# Patient Record
Sex: Female | Born: 1950
Health system: Southern US, Community
[De-identification: ages and names within clinical notes are randomized; demographics above are authoritative.]

## PROBLEM LIST (undated history)

## (undated) DIAGNOSIS — H269 Unspecified cataract: Secondary | ICD-10-CM

## (undated) DIAGNOSIS — M810 Age-related osteoporosis without current pathological fracture: Secondary | ICD-10-CM

## (undated) DIAGNOSIS — G629 Polyneuropathy, unspecified: Secondary | ICD-10-CM

## (undated) DIAGNOSIS — J45909 Unspecified asthma, uncomplicated: Secondary | ICD-10-CM

## (undated) DIAGNOSIS — R011 Cardiac murmur, unspecified: Secondary | ICD-10-CM

## (undated) DIAGNOSIS — E785 Hyperlipidemia, unspecified: Secondary | ICD-10-CM

## (undated) DIAGNOSIS — S329XXA Fracture of unspecified parts of lumbosacral spine and pelvis, initial encounter for closed fracture: Secondary | ICD-10-CM

## (undated) DIAGNOSIS — F329 Major depressive disorder, single episode, unspecified: Secondary | ICD-10-CM

## (undated) DIAGNOSIS — G709 Myoneural disorder, unspecified: Secondary | ICD-10-CM

## (undated) DIAGNOSIS — S060XAA Concussion with loss of consciousness status unknown, initial encounter: Secondary | ICD-10-CM

## (undated) DIAGNOSIS — E739 Lactose intolerance, unspecified: Secondary | ICD-10-CM

## (undated) DIAGNOSIS — F419 Anxiety disorder, unspecified: Secondary | ICD-10-CM

## (undated) DIAGNOSIS — S060X9A Concussion with loss of consciousness of unspecified duration, initial encounter: Secondary | ICD-10-CM

## (undated) DIAGNOSIS — M1990 Unspecified osteoarthritis, unspecified site: Secondary | ICD-10-CM

## (undated) DIAGNOSIS — F32A Depression, unspecified: Secondary | ICD-10-CM

## (undated) DIAGNOSIS — D126 Benign neoplasm of colon, unspecified: Secondary | ICD-10-CM

## (undated) DIAGNOSIS — I201 Angina pectoris with documented spasm: Secondary | ICD-10-CM

## (undated) DIAGNOSIS — Z5189 Encounter for other specified aftercare: Secondary | ICD-10-CM

## (undated) DIAGNOSIS — I7300 Raynaud's syndrome without gangrene: Secondary | ICD-10-CM

## (undated) DIAGNOSIS — N3010 Interstitial cystitis (chronic) without hematuria: Secondary | ICD-10-CM

## (undated) DIAGNOSIS — I341 Nonrheumatic mitral (valve) prolapse: Secondary | ICD-10-CM

## (undated) DIAGNOSIS — D649 Anemia, unspecified: Secondary | ICD-10-CM

## (undated) DIAGNOSIS — E079 Disorder of thyroid, unspecified: Secondary | ICD-10-CM

## (undated) DIAGNOSIS — K219 Gastro-esophageal reflux disease without esophagitis: Secondary | ICD-10-CM

## (undated) DIAGNOSIS — Z1509 Genetic susceptibility to other malignant neoplasm: Secondary | ICD-10-CM

## (undated) DIAGNOSIS — R5383 Other fatigue: Secondary | ICD-10-CM

## (undated) DIAGNOSIS — G4733 Obstructive sleep apnea (adult) (pediatric): Secondary | ICD-10-CM

## (undated) DIAGNOSIS — T7840XA Allergy, unspecified, initial encounter: Secondary | ICD-10-CM

## (undated) DIAGNOSIS — C189 Malignant neoplasm of colon, unspecified: Secondary | ICD-10-CM

## (undated) HISTORY — PX: RETINAL TEAR REPAIR CRYOTHERAPY: SHX5304

## (undated) HISTORY — DX: Polyneuropathy, unspecified: G62.9

## (undated) HISTORY — DX: Allergy, unspecified, initial encounter: T78.40XA

## (undated) HISTORY — DX: Disorder of thyroid, unspecified: E07.9

## (undated) HISTORY — PX: OVARIAN CYST REMOVAL: SHX89

## (undated) HISTORY — PX: FOOT NEUROMA SURGERY: SHX646

## (undated) HISTORY — DX: Other fatigue: R53.83

## (undated) HISTORY — DX: Encounter for other specified aftercare: Z51.89

## (undated) HISTORY — DX: Unspecified osteoarthritis, unspecified site: M19.90

## (undated) HISTORY — PX: VARICOSE VEIN SURGERY: SHX832

## (undated) HISTORY — DX: Interstitial cystitis (chronic) without hematuria: N30.10

## (undated) HISTORY — DX: Unspecified cataract: H26.9

## (undated) HISTORY — DX: Nonrheumatic mitral (valve) prolapse: I34.1

## (undated) HISTORY — DX: Obstructive sleep apnea (adult) (pediatric): G47.33

## (undated) HISTORY — DX: Concussion with loss of consciousness status unknown, initial encounter: S06.0XAA

## (undated) HISTORY — DX: Depression, unspecified: F32.A

## (undated) HISTORY — DX: Major depressive disorder, single episode, unspecified: F32.9

## (undated) HISTORY — DX: Hyperlipidemia, unspecified: E78.5

## (undated) HISTORY — DX: Lactose intolerance, unspecified: E73.9

## (undated) HISTORY — PX: POLYPECTOMY: SHX149

## (undated) HISTORY — DX: Fracture of unspecified parts of lumbosacral spine and pelvis, initial encounter for closed fracture: S32.9XXA

## (undated) HISTORY — PX: MENISCUS REPAIR: SHX5179

## (undated) HISTORY — DX: Anemia, unspecified: D64.9

## (undated) HISTORY — DX: Cardiac murmur, unspecified: R01.1

## (undated) HISTORY — DX: Age-related osteoporosis without current pathological fracture: M81.0

## (undated) HISTORY — PX: COLONOSCOPY: SHX174

## (undated) HISTORY — PX: TOOTH EXTRACTION: SUR596

## (undated) HISTORY — DX: Malignant neoplasm of colon, unspecified: C18.9

## (undated) HISTORY — PX: BUNIONECTOMY: SHX129

## (undated) HISTORY — PX: UPPER GASTROINTESTINAL ENDOSCOPY: SHX188

## (undated) HISTORY — DX: Concussion with loss of consciousness of unspecified duration, initial encounter: S06.0X9A

## (undated) HISTORY — DX: Benign neoplasm of colon, unspecified: D12.6

## (undated) HISTORY — DX: Angina pectoris with documented spasm: I20.1

---

## 1991-03-13 HISTORY — PX: NASAL SINUS SURGERY: SHX719

## 1999-09-14 ENCOUNTER — Other Ambulatory Visit: Admission: RE | Admit: 1999-09-14 | Discharge: 1999-09-14 | Payer: Self-pay | Admitting: *Deleted

## 2000-09-18 ENCOUNTER — Other Ambulatory Visit: Admission: RE | Admit: 2000-09-18 | Discharge: 2000-09-18 | Payer: Self-pay | Admitting: *Deleted

## 2001-05-02 ENCOUNTER — Encounter: Admission: RE | Admit: 2001-05-02 | Discharge: 2001-05-02 | Payer: Self-pay | Admitting: Internal Medicine

## 2001-05-02 ENCOUNTER — Encounter: Payer: Self-pay | Admitting: Internal Medicine

## 2001-12-31 ENCOUNTER — Other Ambulatory Visit: Admission: RE | Admit: 2001-12-31 | Discharge: 2001-12-31 | Payer: Self-pay | Admitting: Obstetrics and Gynecology

## 2003-01-07 ENCOUNTER — Other Ambulatory Visit: Admission: RE | Admit: 2003-01-07 | Discharge: 2003-01-07 | Payer: Self-pay | Admitting: Obstetrics and Gynecology

## 2003-10-04 DIAGNOSIS — D229 Melanocytic nevi, unspecified: Secondary | ICD-10-CM

## 2003-10-04 HISTORY — DX: Melanocytic nevi, unspecified: D22.9

## 2006-08-03 ENCOUNTER — Emergency Department (HOSPITAL_COMMUNITY): Admission: EM | Admit: 2006-08-03 | Discharge: 2006-08-03 | Payer: Self-pay | Admitting: Emergency Medicine

## 2010-03-12 DIAGNOSIS — IMO0001 Reserved for inherently not codable concepts without codable children: Secondary | ICD-10-CM

## 2010-03-12 DIAGNOSIS — Z5189 Encounter for other specified aftercare: Secondary | ICD-10-CM

## 2010-03-12 DIAGNOSIS — C189 Malignant neoplasm of colon, unspecified: Secondary | ICD-10-CM

## 2010-03-12 HISTORY — DX: Encounter for other specified aftercare: Z51.89

## 2010-03-12 HISTORY — DX: Reserved for inherently not codable concepts without codable children: IMO0001

## 2010-03-12 HISTORY — DX: Malignant neoplasm of colon, unspecified: C18.9

## 2010-03-12 HISTORY — PX: OTHER SURGICAL HISTORY: SHX169

## 2010-03-30 ENCOUNTER — Ambulatory Visit (HOSPITAL_COMMUNITY)
Admission: RE | Admit: 2010-03-30 | Discharge: 2010-03-30 | Payer: Self-pay | Source: Home / Self Care | Attending: Gastroenterology | Admitting: Gastroenterology

## 2010-03-31 ENCOUNTER — Ambulatory Visit (HOSPITAL_COMMUNITY)
Admission: RE | Admit: 2010-03-31 | Discharge: 2010-03-31 | Payer: Self-pay | Source: Home / Self Care | Attending: Gastroenterology | Admitting: Gastroenterology

## 2010-03-31 NOTE — Op Note (Signed)
NAMEMAKENZEE, CHOUDHRY              ACCOUNT NO.:  [id number]  MEDICAL RECORD NO.:  [id number]          PATIENT TYPE:  AMB  LOCATION:  ENDO                         FACILITY:  [facility]  PHYSICIAN:  Danise Edge, M.D.   DATE OF BIRTH:  [birth year]  DATE OF PROCEDURE:  03/30/2010 DATE OF DISCHARGE:                              OPERATIVE REPORT   NAME OF REPORT:  Esophagogastroduodenoscopy and colonoscopy report.  REFERRING PHYSICIAN:  Theressa Millard, M.D.  HISTORY:  Angela Hart is a 60 year old female born [birth year].  The patient has unexplained iron-deficiency anemia associated with a low hemoglobin, serum ferritin, and serum iron saturation, associated with burning epigastric discomfort and intermittent crampy periumbilical abdominal discomfort.  She reports no gastrointestinal bleeding, hematuria, hemoptysis, or vaginal bleeding.  In November 2008, the patient underwent a normal screening colonoscopy in a poorly prepped colon.  CHRONIC MEDICATIONS: 1. Lexapro. 2. Wellbutrin. 3. Advil. 4. Tums. 5. Vitamin D. 6. Calcium. 7. Vitamin C. 8. Omega 3 capsules. 9. Glucosamine with chondroitin or Oracea.  PAST MEDICAL AND SURGICAL HISTORY: 1. Right knee arthroscopy for meniscus tear. 2. Sinus surgery. 3. Ovarian cystectomy. 4. Morton's neuroma removal. 5. Varicose vein surgery. 6. Allergic rhinitis. 7. Depression. 8. Mitral valve prolapse. 9. Rosacea. 10.Raynaud's phenomenon.  HABITS:  The patient has never smoked cigarettes.  She does not consume alcohol.  MEDICATION ALLERGIES:  None.  PROCEDURE:  Diagnostic esophagogastroduodenoscopy.  After obtaining informed consent of the patient was placed in the left lateral decubitus position.  She received propofol sedation administered by Anesthesia. The Pentax gastroscope was passed through the posterior hypopharynx into the proximal esophagus without difficulty.  The hypopharynx, larynx and vocal cords appeared  normal.  Esophagoscopy:  The proximal mid and lower segments of the esophageal mucosa appeared normal.  The squamocolumnar junction is noted at 45 cm from the incisor teeth.  Gastroscopy:  Retroflex view of the gastric cardia and fundus was normal.  The gastric body, antrum and pylorus appear normal.  Duodenoscopy:  The duodenal bulb and descending duodenum appear normal. Small bowel biopsies were performed to look for signs of celiac disease.  ASSESSMENT:  Normal esophagogastroduodenoscopy.  Small bowel biopsies to look for celiac disease pending.  PROCEDURE:  Diagnostic colonoscopy.  Anal inspection and digital rectal exam were normal.  The Pentax pediatric colonoscope was introduced into the rectum and advanced to approximately 70 cm from the anal verge.  At 70 cm from the anal verge, there is a large circumferential obstructing tumor consistent with a primary adenocarcinoma.  Anatomically, I cannot determine where the tumor in the colon is.  Colonic preparation was good.  Endoscopic appearance of the rectum was normal.  Retroflexed view of the distal rectum was normal.  Endoscopic appearance of the colon up to 70 cm was normal.  At 70 cm from the anal verge a large circumferential tumor was encountered obstructing the colon.  I was unable to traverse the obstructing colon tumor to perform a complete colonoscopy.  Biopsies were performed to confirm my suspicion of a primary adenocarcinoma.  ASSESSMENT:  Obstructing cancer of the colon noted at 70 cm from the  anal verge with biopsies pending.  I could not determine where in the colon the tumor is located.  Otherwise the colonic mucosa and rectal mucosa distal to the obstructing tumor was normal.  RECOMMENDATIONS:  I will schedule the patient for a CT scan of the abdomen and pelvis to localize the tumor in preparation for surgery.          ______________________________ Danise Edge, M.D.     MJ/MEDQ  D:  03/30/2010   T:  03/30/2010  Job:  [id number]  cc:   Theressa Millard, M.D. Fax: [phone]  Electronically Signed by Danise Edge M.D. on 03/31/2010 12:49:29 PM

## 2010-04-03 ENCOUNTER — Encounter (HOSPITAL_COMMUNITY)
Admission: RE | Admit: 2010-04-03 | Discharge: 2010-04-11 | Payer: Self-pay | Source: Home / Self Care | Attending: Gastroenterology | Admitting: Gastroenterology

## 2010-04-03 LAB — COMPREHENSIVE METABOLIC PANEL
ALT: 13 U/L (ref 0–35)
AST: 16 U/L (ref 0–37)
Albumin: 2.8 g/dL — ABNORMAL LOW (ref 3.5–5.2)
Alkaline Phosphatase: 66 U/L (ref 39–117)
BUN: 7 mg/dL (ref 6–23)
CO2: 29 mEq/L (ref 19–32)
Calcium: 8.3 mg/dL — ABNORMAL LOW (ref 8.4–10.5)
Chloride: 99 mEq/L (ref 96–112)
Creatinine, Ser: 0.68 mg/dL (ref 0.4–1.2)
GFR calc Af Amer: >60 mL/min (ref >60)
GFR calc non Af Amer: >60 mL/min (ref >60)
Glucose, Bld: 79 mg/dL (ref 70–99)
Potassium: 3.4 mEq/L — ABNORMAL LOW (ref 3.5–5.1)
Sodium: 137 mEq/L (ref 135–145)
Total Bilirubin: 0.3 mg/dL (ref 0.3–1.2)
Total Protein: 6.1 g/dL (ref 6.0–8.3)

## 2010-04-03 LAB — DIFFERENTIAL
Basophils Absolute: 0.1 10*3/uL (ref 0.0–0.1)
Basophils Relative: 1 % (ref 0–1)
Eosinophils Absolute: 0.2 10*3/uL (ref 0.0–0.7)
Eosinophils Relative: 2 % (ref 0–5)
Lymphocytes Relative: 13 % (ref 12–46)
Lymphs Abs: 1.1 10*3/uL (ref 0.7–4.0)
Monocytes Absolute: 0.7 10*3/uL (ref 0.1–1.0)
Monocytes Relative: 8 % (ref 3–12)
Neutro Abs: 6.5 10*3/uL (ref 1.7–7.7)
Neutrophils Relative %: 77 % (ref 43–77)

## 2010-04-03 LAB — CBC
HCT: 24.3 % — ABNORMAL LOW (ref 36.0–46.0)
Hemoglobin: 7.2 g/dL — ABNORMAL LOW (ref 12.0–15.0)
MCH: 23.6 pg — ABNORMAL LOW (ref 26.0–34.0)
MCHC: 29.6 g/dL — ABNORMAL LOW (ref 30.0–36.0)
MCV: 79.7 fL (ref 78.0–100.0)
Platelets: 508 10*3/uL — ABNORMAL HIGH (ref 150–400)
RBC: 3.05 MIL/uL — ABNORMAL LOW (ref 3.87–5.11)
RDW: 18.1 % — ABNORMAL HIGH (ref 11.5–15.5)
WBC: 8.5 10*3/uL (ref 4.0–10.5)

## 2010-04-03 LAB — CEA: CEA: 4.1 ng/mL (ref 0.0–5.0)

## 2010-04-04 LAB — ABO/RH: ABO/RH(D): O NEG

## 2010-04-05 LAB — CROSSMATCH
ABO/RH(D): O NEG
Antibody Screen: NEGATIVE
Unit division: 0
Unit division: 0

## 2010-04-10 LAB — PROTIME-INR
INR: 1.08 (ref 0.00–1.49)
Prothrombin Time: 14.2 seconds (ref 11.6–15.2)

## 2010-04-10 LAB — CBC
HCT: 29.4 % — ABNORMAL LOW (ref 36.0–46.0)
Hemoglobin: 9 g/dL — ABNORMAL LOW (ref 12.0–15.0)
MCH: 24.4 pg — ABNORMAL LOW (ref 26.0–34.0)
MCHC: 30.6 g/dL (ref 30.0–36.0)
MCV: 79.7 fL (ref 78.0–100.0)
Platelets: 496 10*3/uL — ABNORMAL HIGH (ref 150–400)
RBC: 3.69 MIL/uL — ABNORMAL LOW (ref 3.87–5.11)
RDW: 18.3 % — ABNORMAL HIGH (ref 11.5–15.5)
WBC: 8.8 10*3/uL (ref 4.0–10.5)

## 2010-04-10 LAB — DIFFERENTIAL
Basophils Absolute: 0.1 10*3/uL (ref 0.0–0.1)
Basophils Relative: 1 % (ref 0–1)
Eosinophils Absolute: 0.2 10*3/uL (ref 0.0–0.7)
Eosinophils Relative: 2 % (ref 0–5)
Lymphocytes Relative: 13 % (ref 12–46)
Lymphs Abs: 1.1 10*3/uL (ref 0.7–4.0)
Monocytes Absolute: 0.5 10*3/uL (ref 0.1–1.0)
Monocytes Relative: 6 % (ref 3–12)
Neutro Abs: 6.9 10*3/uL (ref 1.7–7.7)
Neutrophils Relative %: 78 % — ABNORMAL HIGH (ref 43–77)

## 2010-04-10 LAB — COMPREHENSIVE METABOLIC PANEL
ALT: 22 U/L (ref 0–35)
AST: 21 U/L (ref 0–37)
Albumin: 3 g/dL — ABNORMAL LOW (ref 3.5–5.2)
Alkaline Phosphatase: 66 U/L (ref 39–117)
BUN: 12 mg/dL (ref 6–23)
CO2: 30 mEq/L (ref 19–32)
Calcium: 9.1 mg/dL (ref 8.4–10.5)
Chloride: 101 mEq/L (ref 96–112)
Creatinine, Ser: 0.74 mg/dL (ref 0.4–1.2)
GFR calc Af Amer: >60 mL/min (ref >60)
GFR calc non Af Amer: >60 mL/min (ref >60)
Glucose, Bld: 75 mg/dL (ref 70–99)
Potassium: 4 mEq/L (ref 3.5–5.1)
Sodium: 138 mEq/L (ref 135–145)
Total Bilirubin: 0.3 mg/dL (ref 0.3–1.2)
Total Protein: 6.6 g/dL (ref 6.0–8.3)

## 2010-04-10 LAB — SURGICAL PCR SCREEN
MRSA, PCR: NEGATIVE
Staphylococcus aureus: NEGATIVE

## 2010-04-12 ENCOUNTER — Other Ambulatory Visit: Payer: Self-pay | Admitting: General Surgery

## 2010-04-12 ENCOUNTER — Inpatient Hospital Stay (HOSPITAL_COMMUNITY)
Admission: RE | Admit: 2010-04-12 | Discharge: 2010-04-18 | DRG: 148 | Disposition: A | Discharge status: Home Health | Payer: BC Managed Care – PPO | Attending: General Surgery | Admitting: General Surgery

## 2010-04-12 DIAGNOSIS — D630 Anemia in neoplastic disease: Secondary | ICD-10-CM | POA: Diagnosis present

## 2010-04-12 DIAGNOSIS — T8140XA Infection following a procedure, unspecified, initial encounter: Secondary | ICD-10-CM | POA: Diagnosis not present

## 2010-04-12 DIAGNOSIS — C184 Malignant neoplasm of transverse colon: Principal | ICD-10-CM | POA: Diagnosis present

## 2010-04-12 DIAGNOSIS — Y838 Other surgical procedures as the cause of abnormal reaction of the patient, or of later complication, without mention of misadventure at the time of the procedure: Secondary | ICD-10-CM | POA: Diagnosis not present

## 2010-04-12 DIAGNOSIS — K560 Paralytic ileus: Secondary | ICD-10-CM | POA: Diagnosis not present

## 2010-04-12 HISTORY — PX: PARTIAL COLECTOMY: SHX5273

## 2010-04-13 LAB — BASIC METABOLIC PANEL
BUN: 5 mg/dL — ABNORMAL LOW (ref 6–23)
CO2: 31 mEq/L (ref 19–32)
Calcium: 8.6 mg/dL (ref 8.4–10.5)
Chloride: 100 mEq/L (ref 96–112)
Creatinine, Ser: 0.9 mg/dL (ref 0.4–1.2)
GFR calc Af Amer: >60 mL/min (ref >60)
GFR calc non Af Amer: >60 mL/min (ref >60)
Glucose, Bld: 124 mg/dL — ABNORMAL HIGH (ref 70–99)
Potassium: 4.9 mEq/L (ref 3.5–5.1)
Sodium: 138 mEq/L (ref 135–145)

## 2010-04-13 LAB — CBC
HCT: 31 % — ABNORMAL LOW (ref 36.0–46.0)
Hemoglobin: 9.4 g/dL — ABNORMAL LOW (ref 12.0–15.0)
MCH: 24.4 pg — ABNORMAL LOW (ref 26.0–34.0)
MCHC: 30.3 g/dL (ref 30.0–36.0)
MCV: 80.5 fL (ref 78.0–100.0)
Platelets: 537 10*3/uL — ABNORMAL HIGH (ref 150–400)
RBC: 3.85 MIL/uL — ABNORMAL LOW (ref 3.87–5.11)
RDW: 18.3 % — ABNORMAL HIGH (ref 11.5–15.5)
WBC: 10.6 10*3/uL — ABNORMAL HIGH (ref 4.0–10.5)

## 2010-04-16 LAB — CROSSMATCH
ABO/RH(D): O NEG
Antibody Screen: NEGATIVE
Unit division: 0
Unit division: 0

## 2010-04-16 LAB — CBC
HCT: 27.7 % — ABNORMAL LOW (ref 36.0–46.0)
Hemoglobin: 8.6 g/dL — ABNORMAL LOW (ref 12.0–15.0)
MCH: 24.2 pg — ABNORMAL LOW (ref 26.0–34.0)
MCHC: 31 g/dL (ref 30.0–36.0)
MCV: 78 fL (ref 78.0–100.0)
Platelets: 525 10*3/uL — ABNORMAL HIGH (ref 150–400)
RBC: 3.55 MIL/uL — ABNORMAL LOW (ref 3.87–5.11)
RDW: 18.1 % — ABNORMAL HIGH (ref 11.5–15.5)
WBC: 7.9 10*3/uL (ref 4.0–10.5)

## 2010-04-16 LAB — BASIC METABOLIC PANEL
BUN: 6 mg/dL (ref 6–23)
CO2: 32 mEq/L (ref 19–32)
Calcium: 8.7 mg/dL (ref 8.4–10.5)
Chloride: 96 mEq/L (ref 96–112)
Creatinine, Ser: 0.59 mg/dL (ref 0.4–1.2)
GFR calc Af Amer: >60 mL/min (ref >60)
GFR calc non Af Amer: >60 mL/min (ref >60)
Glucose, Bld: 113 mg/dL — ABNORMAL HIGH (ref 70–99)
Potassium: 3.7 mEq/L (ref 3.5–5.1)
Sodium: 137 mEq/L (ref 135–145)

## 2010-04-17 LAB — CBC
HCT: 27.6 % — ABNORMAL LOW (ref 36.0–46.0)
Hemoglobin: 8.5 g/dL — ABNORMAL LOW (ref 12.0–15.0)
MCH: 24 pg — ABNORMAL LOW (ref 26.0–34.0)
MCHC: 30.8 g/dL (ref 30.0–36.0)
MCV: 78 fL (ref 78.0–100.0)
Platelets: 497 10*3/uL — ABNORMAL HIGH (ref 150–400)
RBC: 3.54 MIL/uL — ABNORMAL LOW (ref 3.87–5.11)
RDW: 18.2 % — ABNORMAL HIGH (ref 11.5–15.5)
WBC: 7.3 10*3/uL (ref 4.0–10.5)

## 2010-04-18 LAB — CBC
HCT: 27.4 % — ABNORMAL LOW (ref 36.0–46.0)
Hemoglobin: 8.5 g/dL — ABNORMAL LOW (ref 12.0–15.0)
MCH: 24.1 pg — ABNORMAL LOW (ref 26.0–34.0)
MCHC: 31 g/dL (ref 30.0–36.0)
MCV: 77.6 fL — ABNORMAL LOW (ref 78.0–100.0)
Platelets: 532 10*3/uL — ABNORMAL HIGH (ref 150–400)
RBC: 3.53 MIL/uL — ABNORMAL LOW (ref 3.87–5.11)
RDW: 18.4 % — ABNORMAL HIGH (ref 11.5–15.5)
WBC: 11 10*3/uL — ABNORMAL HIGH (ref 4.0–10.5)

## 2010-04-22 NOTE — Op Note (Signed)
Angela Hart, Angela Hart              ACCOUNT NO.:  [id number]  MEDICAL RECORD NO.:  [id number]           PATIENT TYPE:  I  LOCATION:  0007                         FACILITY:  [facility]  PHYSICIAN:  Adolph Pollack, M.D.DATE OF BIRTH:  11-10-1950  DATE OF PROCEDURE:  04/12/2010 DATE OF DISCHARGE:                              OPERATIVE REPORT   PREOPERATIVE DIAGNOSIS:  Transverse left colon cancer.  POSTOPERATIVE DIAGNOSIS:  Distal transverse colon cancer.  PROCEDURE:  Laparoscopic hand-assisted left colectomy with mobilization of the splenic flexure.  SURGEON:  Adolph Pollack, M.D.  ASSISTANT:  Currie Paris, M.D.  ANESTHESIA:  General.  INDICATIONS:  Angela Hart is a 60 year old female who was found to have profound anemia.  She underwent evaluation and was noted to have a partially obstructing lesion, about 70 cm from the anal verge, which was felt to be in the left colon region.  Her CEA level was 4.1 and a CT scan demonstrated the lesion looked to be inferior to the splenic flexure.  No definite metastatic disease was noted.  She now presents for elective partial colectomy.  Procedure, risks, and aftercare were discussed with her preoperatively.  TECHNIQUE:  She was seen in the holding area and brought to the operating room, placed supine on the operating table, and general anesthetic was administered.  She was placed in a lithotomy position.  A Foley catheter and orogastric tube were placed.  The abdominal wall and perineal area were sterilely prepped and draped.  I placed her in slight reverse Trendelenburg position.  In the right upper quadrant, a small 5-mm incision was made.  Using a 5-mm Optiview trocar, I was able to gain access into the peritoneal cavity.  A vision of the area below the pneumoperitoneum was created.  I visualized the area below the trocar.  There is no evidence of bleeding or intestinal injury.  Following this, I placed a 5-mm trocar  into a lower midline incision.  I began to evaluate the abdominal cavity.  The sigmoid colon was somewhat redundant and very mobile.  The transverse colon was very mobile and could be pulled up onto the umbilicus.  The tumor was identified; it was in the distal transverse colon.  I carefully removed the lower midline 5-mm trocar and made a larger incision through the skin, subcutaneous tissue, and fascia, allowing me to put the GelPort in.  I placed a 5-mm trocar in the left lower quadrant and one in the right lower quadrant.  I began mobilizing the omentum free from the transverse colon from the midportion up toward the splenic flexure. This was using electrocautery.  I then mobilized the left colon sigmoid colon by dividing the lateral attachments, keeping my plane of dissection anterior to the ureter.  I was able to mobilize the splenic flexure fairly well in this fashion using hand assistance.  At this point, I got to where I could pull most of the transverse colon inferior to the umbilicus as well as bring the sigmoid colon and all the rectosigmoid junction almost underneath the umbilicus.  I then grasped the bulky tumor and brought it out  through the limited lower midline incision, leaving the wound protection device in.  There appeared to be some tumor extension outside the colonic wall and there are some enlarged lymph nodes noted close to the tumor.  I chose an area of the proximal through to the transverse colon and divided it here with a GIA stapler. I divided the mid sigmoid colon with GIA stapler.  I divided the mesentery between clamps, taking out a large wedge-shaped specimen of mesentery with the tumor.  The distal margin was marked with a suture and the colon was sent to Pathology as being a left colectomy.  Following this, I performed a side-to-side anastomosis between the proximal transverse colon and the mid sigmoid colon with the GIA stapler.  The common defect was  closed with a linear non cutting stapler.  3-0 silk sutures used to reinforce the distal portion of the staple line.  Anastomosis was patent, viable, and under no tension. Gloves were changed.  The abdominal cavity was copiously irrigated.  I note that there was no evidence of bleeding or intestinal injury, and the fluid was evacuated.  I then removed the wound protection device and closed the extraction site lower abdominal incision with a running #1 PDS suture.  The abdomen was re-insufflated and the laparoscopic was introduced into the abdominal cavity. The fascial closure was solid.  There was no gross evidence of metastatic disease in the liver or other part of the abdominal cavity.  Leftover irrigation fluid was evacuated.  Four quadrant inspection demonstrated no evidence of bleeding or intestinal injury. Trocars were removed and pneumoperitoneum was released.  The limited lower midline incision skin was then irrigated and closed with staples.  The remaining 35-mm trocar sites were closed with 4-0 Monocryl subcuticular stitches, and sterile dressings were applied.  She tolerated the procedure well without any apparent complications and was taken to the recovery room in satisfactory condition.     Adolph Pollack, M.D.     Kari Baars  D:  04/12/2010  T:  04/12/2010  Job:  [id number]  cc:   Danise Edge, M.D. Fax: [phone]  Theressa Millard, M.D. Fax: [phone]  Electronically Signed by Avel Peace M.D. on 04/17/2010 02:46:41 PM

## 2010-04-26 ENCOUNTER — Other Ambulatory Visit: Payer: Self-pay | Admitting: Hematology and Oncology

## 2010-04-26 ENCOUNTER — Encounter (HOSPITAL_BASED_OUTPATIENT_CLINIC_OR_DEPARTMENT_OTHER): Payer: BC Managed Care – PPO | Admitting: Hematology and Oncology

## 2010-04-26 DIAGNOSIS — C189 Malignant neoplasm of colon, unspecified: Secondary | ICD-10-CM

## 2010-04-26 DIAGNOSIS — C7800 Secondary malignant neoplasm of unspecified lung: Secondary | ICD-10-CM

## 2010-04-26 DIAGNOSIS — C183 Malignant neoplasm of hepatic flexure: Secondary | ICD-10-CM

## 2010-04-26 LAB — CBC WITH DIFFERENTIAL/PLATELET
BASO%: 0.6 % (ref 0.0–2.0)
Basophils Absolute: 0.1 10*3/uL (ref 0.0–0.1)
EOS%: 2.2 % (ref 0.0–7.0)
Eosinophils Absolute: 0.2 10*3/uL (ref 0.0–0.5)
HCT: 31.5 % — ABNORMAL LOW (ref 34.8–46.6)
HGB: 10 g/dL — ABNORMAL LOW (ref 11.6–15.9)
LYMPH%: 12.5 % — ABNORMAL LOW (ref 14.0–49.7)
MCH: 24.6 pg — ABNORMAL LOW (ref 25.1–34.0)
MCHC: 31.6 g/dL (ref 31.5–36.0)
MCV: 77.9 fL — ABNORMAL LOW (ref 79.5–101.0)
MONO#: 0.6 10*3/uL (ref 0.1–0.9)
MONO%: 6.6 % (ref 0.0–14.0)
NEUT#: 6.9 10*3/uL — ABNORMAL HIGH (ref 1.5–6.5)
NEUT%: 78.1 % — ABNORMAL HIGH (ref 38.4–76.8)
Platelets: 836 10*3/uL — ABNORMAL HIGH (ref 145–400)
RBC: 4.04 10*6/uL (ref 3.70–5.45)
RDW: 21.5 % — ABNORMAL HIGH (ref 11.2–14.5)
WBC: 8.8 10*3/uL (ref 3.9–10.3)
lymph#: 1.1 10*3/uL (ref 0.9–3.3)

## 2010-04-26 LAB — COMPREHENSIVE METABOLIC PANEL
Albumin: 4 g/dL (ref 3.5–5.2)
BUN: 19 mg/dL (ref 6–23)
Calcium: 9.4 mg/dL (ref 8.4–10.5)
Chloride: 95 mEq/L — ABNORMAL LOW (ref 96–112)
Glucose, Bld: 83 mg/dL (ref 70–99)
Potassium: 4.6 mEq/L (ref 3.5–5.3)
Total Protein: 6.6 g/dL (ref 6.0–8.3)

## 2010-04-26 LAB — IRON AND TIBC
Iron: 38 ug/dL — ABNORMAL LOW (ref 42–145)
UIBC: 419 ug/dL

## 2010-05-09 ENCOUNTER — Other Ambulatory Visit (HOSPITAL_COMMUNITY): Payer: BC Managed Care – PPO

## 2010-05-15 ENCOUNTER — Ambulatory Visit (HOSPITAL_COMMUNITY)
Admission: RE | Admit: 2010-05-15 | Discharge: 2010-05-15 | Disposition: A | Discharge status: Home / Self Care | Payer: BC Managed Care – PPO | Source: Ambulatory Visit | Attending: General Surgery | Admitting: General Surgery

## 2010-05-15 ENCOUNTER — Ambulatory Visit (HOSPITAL_COMMUNITY): Payer: BC Managed Care – PPO

## 2010-05-15 DIAGNOSIS — F341 Dysthymic disorder: Secondary | ICD-10-CM | POA: Insufficient documentation

## 2010-05-15 DIAGNOSIS — C189 Malignant neoplasm of colon, unspecified: Secondary | ICD-10-CM | POA: Insufficient documentation

## 2010-05-15 LAB — BASIC METABOLIC PANEL
BUN: 10 mg/dL (ref 6–23)
CO2: 29 mEq/L (ref 19–32)
Chloride: 101 mEq/L (ref 96–112)
GFR calc non Af Amer: >60 mL/min (ref >60)
Glucose, Bld: 82 mg/dL (ref 70–99)
Potassium: 4.4 mEq/L (ref 3.5–5.1)

## 2010-05-15 LAB — CBC
HCT: 34.4 % — ABNORMAL LOW (ref 36.0–46.0)
Hemoglobin: 10.7 g/dL — ABNORMAL LOW (ref 12.0–15.0)
MCHC: 31.1 g/dL (ref 30.0–36.0)
MCV: 81.1 fL (ref 78.0–100.0)
RDW: 23 % — ABNORMAL HIGH (ref 11.5–15.5)
WBC: 5.1 10*3/uL (ref 4.0–10.5)

## 2010-05-15 LAB — PROTIME-INR: INR: 0.96 (ref 0.00–1.49)

## 2010-05-18 ENCOUNTER — Ambulatory Visit (HOSPITAL_COMMUNITY)
Admission: RE | Admit: 2010-05-18 | Discharge: 2010-05-18 | Disposition: A | Discharge status: Home / Self Care | Payer: BC Managed Care – PPO | Source: Ambulatory Visit | Attending: Gastroenterology | Admitting: Gastroenterology

## 2010-05-18 ENCOUNTER — Other Ambulatory Visit: Payer: Self-pay | Admitting: Gastroenterology

## 2010-05-18 DIAGNOSIS — D126 Benign neoplasm of colon, unspecified: Secondary | ICD-10-CM | POA: Insufficient documentation

## 2010-05-18 DIAGNOSIS — C184 Malignant neoplasm of transverse colon: Secondary | ICD-10-CM | POA: Insufficient documentation

## 2010-05-20 NOTE — Op Note (Signed)
Angela Hart, JERSEY              ACCOUNT NO.:  [id number]  MEDICAL RECORD NO.:  [id number]           PATIENT TYPE:  O  LOCATION:  SDSC                         FACILITY:  [facility]  PHYSICIAN:  Adolph Pollack, M.D.DATE OF BIRTH:  12-Aug-1950  DATE OF PROCEDURE:  05/15/2010 DATE OF DISCHARGE:  05/15/2010                              OPERATIVE REPORT   PREOPERATIVE DIAGNOSIS:  Colon cancer.  POSTOPERATIVE DIAGNOSIS:  Colon cancer.  PROCEDURE:  Ultrasound-guided Port-A-Cath insertion into right internal jugular vein with fluoroscopy.  SURGEON:  Adolph Pollack, MD  ANESTHESIA:  Local (Xylocaine) with MAC.  INDICATIONS:  This is a 59 year old female who underwent a partial colectomy and has colon cancer.  She needs long-term venous access for chemotherapy and now presents for Port-A-Cath insertion.  We discussed the procedure risks and aftercare preoperatively.  TECHNIQUE:  She was seen in the holding area and brought to the operating room, placed supine on the operating table and given intravenous sedation.  A roll was placed on her back.  Head was turned to the left and using ultrasound I identified the right internal jugular vein.  The neck and upper chest were then sterilely prepped and draped.  Using ultrasound, I identified the right internal jugular once again and injected local anesthesia in the midportion of the right neck.  Using a 16-gauge needle, I cannulated the right internal jugular vein and passed a wire through it.  Fluoroscopy confirmed the wire to be in the superior vena cava.  The needle was then removed.  Following this, I then injected a local anesthetic inferior to the right clavicle in the right upper chest wall and made an incision through the skin and subcutaneous tissue.  Using electrocautery, I created a pocket for the port.  I then anesthetized a tunnel from the wire to the chest wall incision and made an incision around the wire.  I passed  the catheter from the chest wall incision up through the neck incision next to the wire.  The dilator introducer complex was then placed over the wire into the superior vena cava and verified by way of fluoroscopy. The wire and dilator were removed and the catheter was threaded through the peel-away sheath.  The peel-away sheath was the removed.  Under fluoroscopic guidance, I pulled the catheter back until the tip was at the superior vena cava-right atrium junction.  The catheter withdrew blood and flushed easily.  I then cut the catheter and attached to the port.  It withdrew blood and flushed easily.  The port was then anchored to the chest wall with interrupted 2-0 Vicryl sutures.  Following this, I then closed the right neck incision at the skin with 4- 0 Monocryl subcuticular stitch.  The chest wall incision was closed in 2 layers.  The subcutaneous tissue was approximated with running 3-0 Vicryl suture.  The skin was closed with a 4-0 Monocryl subcuticular stitch.  Steri-Strips and sterile dressing were applied to both wounds.  She tolerated the procedure without apparent complications and was taken to the recovery room in satisfactory condition where a portable chest x- ray is pending.  Adolph Pollack, M.D.     Kari Baars  D:  05/15/2010  T:  05/16/2010  Job:  [id number]  Electronically Signed by Avel Peace M.D. on 05/20/2010 09:22:32 PM

## 2010-05-25 NOTE — Op Note (Signed)
  [name], [name]              ACCOUNT NO.:  [id number]  MEDICAL RECORD NO.:  [id number]           PATIENT TYPE:  O  LOCATION:  WLEN                         FACILITY:  [facility]  PHYSICIAN:  Danise Edge, M.D.   DATE OF BIRTH:  1950/10/18  DATE OF PROCEDURE:  05/18/2010 DATE OF DISCHARGE:                              OPERATIVE REPORT   REFERRING PHYSICIAN:  Theressa Millard, MD  PROCEDURE:  Colonoscopy and polypectomy.  HISTORY:  Angela Hart is a 60 year old female born on [birth year].  The patient has undergone recent colon surgery to remove a large T4 N0 transverse colon cancer, associated with 5/5 markers, demonstrating microsatellite instability. Her preoperative colonoscopy was incomplete due to the obstructing tumor.  The patient has recovered from surgery and is scheduled to undergo a surveillance colonoscopy with polypectomy to prevent colon cancer.  The patient has a 54 year old son.  She will meet with a Dentist.  She will need to be screened for hereditary non-polyposis colon cancer.  ENDOSCOPIST:  Danise Edge, MD  PREMEDICATION:  Versed 6 mg, fentanyl 75 mcg.  PROCEDURE IN DETAIL:  After obtaining informed consent, the patient was placed in the left lateral decubitus position.  Anal inspection and digital rectal exam were normal.  The Pentax pediatric colonoscope was introduced into the rectum and easily advanced to the cecum.  A normal- appearing ileocecal valve and appendiceal orifice were identified. Colonic preparation for the exam today was good.  Rectum normal.  Retroflexed view of the distal rectum normal. Sigmoid colon and descending colon normal. Surgical anastomosis appeared normal. Transverse colon normal. Hepatic flexure normal. Ascending colon normal. Cecum and ileocecal valve.  A 5-mm sessile polyp was removed from the cecum with the cold snare and a 3-mm sessile polyp was removed from the cecum with the cold biopsy  forceps.  ASSESSMENT: 1. Recent surgery to remove a T4 N0 adenocarcinoma of the transverse     colon, associated with microsatellite instability, suspicious for     Lynch syndrome. 2. Two small polyps removed from the cecum. 3. Otherwise, normal surveillance proctocolonoscopy to the cecum     postoperatively.  RECOMMENDATIONS:  The patient will need to meet with a genetic counselor to determine if she has a hereditary nonpolyposis colon cancer to plan future surveillance colonoscopies.  She will receive her oncologic care at [facility].          ______________________________ Danise Edge, M.D.     MJ/MEDQ  D:  05/18/2010  T:  05/18/2010  Job:  [id number]  cc:   Angela Hart, M.D. Fax: [phone]  Angela Hart, M.D. [address], [address] [place] [address]  Electronically Signed by Danise Edge M.D. on 05/25/2010 04:54:31 PM

## 2010-05-26 ENCOUNTER — Other Ambulatory Visit (HOSPITAL_COMMUNITY): Payer: BC Managed Care – PPO

## 2010-05-29 NOTE — Discharge Summary (Signed)
  NAMERISA, Angela Hart              ACCOUNT NO.:  [id number]  MEDICAL RECORD NO.:  [id number]           PATIENT TYPE:  I  LOCATION:  1528                         FACILITY:  [facility]  PHYSICIAN:  Adolph Pollack, M.D.DATE OF BIRTH:  March 25, 1950  DATE OF ADMISSION:  04/12/2010 DATE OF DISCHARGE:  04/18/2010                              DISCHARGE SUMMARY   FINAL DISCHARGE DIAGNOSIS:  T4 and N0 transverse colon cancer.  SECONDARY DIAGNOSIS: 1. Chronic anemia secondary to colon cancer. 2. Ileus. 3. Depression.  PROCEDURE:  Laparoscopic hand assisted left colectomy with mobilization of splenic flexure, April 12, 2010.  REASON FOR ADMISSION:  This 60 year old female was found to have a very profound anemia and underwent evaluation.  She underwent a colonoscopy demonstrating a partially obstructing lesion approximately 70 cm from the anal verge.  CEA level was 4.1 and CT scan demonstrated a lesion but no obvious metastatic disease.  She subsequently was admitted for the above procedure.  HOSPITAL COURSE:  She underwent the above procedure which she tolerated well.  Hemoglobin is 9.4 on her first postoperative day and she was started on sips of liquids.  She was advanced to full liquids and removed the Foley catheter on her second postoperative day.  Following that however she did not have much of any bowel activity.  Pathology came back showing a T4, N0 lesion which was discussed with them.  Bowel function slowly returned and her diet was advanced.  She is noted to have a low-grade fever and leukocytosis of [address] on her sixth postoperative day.  The inferior aspect of her limited midline incision was somewhat red.  It was opened up and purulent drainage was removed consistent with a superficial wound infection.  However, she was doing well otherwise and is able to be discharged on Augmentin, Percocet as well as her home medications.  Home health nursing was also called to assist  her with twice a day dressing changes.  DISPOSITION:  Discharged to home in satisfactory condition, April 18, 2010.  Home health nurses will come to her house to assist with dressing changes.  She is given activity restrictions and told to resume her preoperative medications as well as the Augmentin and Percocet.  She will follow up in the office with me in 1-2 weeks.     Adolph Pollack, M.D.     Angela Hart  D:  05/20/2010  T:  05/20/2010  Job:  [id number]  cc:   Danise Edge, M.D. Fax: [phone]  Theressa Millard, M.D. Fax: [phone]  Electronically Signed by Avel Peace M.D. on 05/29/2010 09:17:04 AM

## 2010-11-20 ENCOUNTER — Ambulatory Visit (INDEPENDENT_AMBULATORY_CARE_PROVIDER_SITE_OTHER): Payer: BC Managed Care – PPO | Admitting: General Surgery

## 2010-11-28 ENCOUNTER — Encounter (INDEPENDENT_AMBULATORY_CARE_PROVIDER_SITE_OTHER): Payer: Self-pay

## 2010-11-29 ENCOUNTER — Encounter (INDEPENDENT_AMBULATORY_CARE_PROVIDER_SITE_OTHER): Payer: Self-pay | Admitting: General Surgery

## 2010-11-29 ENCOUNTER — Ambulatory Visit (INDEPENDENT_AMBULATORY_CARE_PROVIDER_SITE_OTHER): Payer: BC Managed Care – PPO | Admitting: General Surgery

## 2010-11-29 VITALS — BP 118/78 | HR 64 | Temp 97.2°F | Resp 20 | Ht 66.5 in | Wt 129.8 lb

## 2010-11-29 DIAGNOSIS — C184 Malignant neoplasm of transverse colon: Secondary | ICD-10-CM

## 2010-11-29 NOTE — Patient Instructions (Signed)
Please call me ready to schedule your Port-A-Cath removal (after you know the CT scan results).

## 2010-11-29 NOTE — Progress Notes (Signed)
Operation:  Laparoscopic left colectomy for transverse colon cancer  Date:  April 12, 2010  Stage:  T4  HPI:  Angela Hart is here for long-term followup of her transverse colon cancer. She has completed her chemotherapy. She had some toxicity from this. She was treated with a less aggressive regimen. She's feeling much better now. Bowels are moving without difficulty. She is due to have a repeat CT scan in early October. If this is negative for metastatic disease, she will be able to have her Port-A-Cath removed.  PE:  Gen.-thin female, in no acute distress.  Neck-Port-A-Cath seen going into the right lower neck. No cervical adenopathy.  Abdomen-soft, nontender, no organomegaly, well-healed lower midline scar.  Assessment:  T4N0 transverse colon cancer-has completed chemotherapy; no clinical evidence of recurrence.  Plan:  If the October CT scan is negative for metastatic disease, we'll plan on Port-A-Cath removal.  The procedure and risks were discussed with her.  Risks include but are not limited to bleeding, infection, wound healing problems, anesthesia. She seems to understand this and agrees with the plan.

## 2011-01-01 ENCOUNTER — Encounter: Payer: Self-pay | Admitting: Internal Medicine

## 2011-01-01 ENCOUNTER — Ambulatory Visit (INDEPENDENT_AMBULATORY_CARE_PROVIDER_SITE_OTHER): Payer: BC Managed Care – PPO | Admitting: Internal Medicine

## 2011-01-01 VITALS — BP 106/74 | HR 82 | Temp 98.2°F | Ht 66.5 in | Wt 133.0 lb

## 2011-01-01 DIAGNOSIS — C184 Malignant neoplasm of transverse colon: Secondary | ICD-10-CM

## 2011-01-01 DIAGNOSIS — R059 Cough, unspecified: Secondary | ICD-10-CM

## 2011-01-01 DIAGNOSIS — R05 Cough: Secondary | ICD-10-CM | POA: Insufficient documentation

## 2011-01-01 MED ORDER — PREDNISONE (PAK) 10 MG PO TABS: ORAL_TABLET | ORAL | Status: AC

## 2011-01-01 MED ORDER — TRAMADOL HCL 50 MG PO TABS: ORAL_TABLET | ORAL | Status: AC

## 2011-01-01 NOTE — Patient Instructions (Addendum)
Try  Dexilant Take 30-60 min before first meal of the day and Zantac 150 mg  at bedtime until you return.   GERD (REFLUX)  is an extremely common cause of respiratory symptoms, many times with no significant heartburn at all.    It can be treated with medication, but also with lifestyle changes including avoidance of late meals, excessive alcohol, smoking cessation, and avoid fatty foods, chocolate, peppermint, colas, red wine, and acidic juices such as orange juice.  NO MINT OR MENTHOL PRODUCTS SO NO COUGH DROPS  USE SUGARLESS CANDY INSTEAD (jolley ranchers or Stover's)  NO OIL BASED VITAMINS - use powdered substitutes.  NO FISH OIL   Prednisone 10 mg take  4 each am x 2 days,   2 each am x 2 days,  1 each am x2days and stop   Take delsym two tsp every 12 hours and supplement if needed with  tramadol 50 mg up to 2 every 4 hours to suppress the urge to cough. Swallowing water or using ice chips/non mint and menthol containing candies (such as lifesavers or sugarless jolly ranchers) are also effective.  You should rest your voice and avoid activities that you know make you cough.  Once you have eliminated the cough for 3 straight days try reducing the tramadol first,  then the delsym as tolerated.  (if tremulous stop lexapro for a few days)  Please schedule a follow up office visit in 2 weeks, sooner if needed

## 2011-01-01 NOTE — Progress Notes (Signed)
  Subjective:    Patient ID: Angela Hart, female    DOB: 06/24/1950, 60 y.o.   MRN: [id number]  HPI  67 yowf never smoker  On chemo for colon ca Stage III last rx early Sept 2012 with h/o recurrent sinus infections onset in the 1980's s/p sinus surgery and still with sinus infections 3-4 year but no lower respiratory symptoms until August 2012 with refractory cough referred by Dr Earl Gala for pulmonary evaluation   01/01/2011  Initial pulmonary office eval cc indolent onset gradually worse cough slt productive of yellow mucus esp in am but then mostly dry assoc with sob and discomfort center of chest worse with deep breath. Prednisone helped some and 2 different abx and tessilon helps some too but still present. Really not so sob as feels can't take a deep breath s setting off cough.  Still has some nasal congestion but "nothing like my sinus infections in past'  Sleeping ok without nocturnal  or early am exacerbation  of respiratory  c/o's or need for noct saba. Also denies any obvious fluctuation of symptoms with weather or environmental changes or other aggravating or alleviating factors except as outlined above   CT chest at [facility] done 4 weeks prior to ov Nl per pt.    Review of Systems  Constitutional: Negative for fever, chills and unexpected weight change.  HENT: Positive for congestion. Negative for ear pain, nosebleeds, sore throat, rhinorrhea, sneezing, trouble swallowing, dental problem, voice change, postnasal drip and sinus pressure.   Eyes: Negative for visual disturbance.  Respiratory: Positive for cough and shortness of breath. Negative for choking.   Cardiovascular: Positive for chest pain. Negative for leg swelling.  Gastrointestinal: Negative for vomiting, abdominal pain and diarrhea.  Genitourinary: Negative for difficulty urinating.  Musculoskeletal: Negative for arthralgias.  Skin: Negative for rash.  Neurological: Positive for headaches. Negative for tremors and  syncope.  Hematological: Does not bruise/bleed easily.       Objective:   Physical Exam  Pleasant thin wf 133 01/01/2011  Cough with insp at onset and located mid chest to throat HEENT: nl dentition, turbinates, and orophanx. Nl external ear canals without cough reflex   NECK :  without JVD/Nodes/TM/ nl carotid upstrokes bilaterally   LUNGS: no acc muscle use, clear to A and P bilaterally without cough on insp or exp maneuvers   CV:  RRR  no s3 or murmur or increase in P2, no edema   ABD:  soft and nontender with nl excursion in the supine position. No bruits or organomegaly, bowel sounds nl  MS:  warm without deformities, calf tenderness, cyanosis or clubbing  SKIN: warm and dry without lesions    NEURO:  alert, approp, no deficits       Assessment & Plan:

## 2011-01-02 ENCOUNTER — Encounter: Payer: Self-pay | Admitting: Internal Medicine

## 2011-01-02 NOTE — Assessment & Plan Note (Addendum)
The most common causes of chronic cough in immunocompetent adults include the following: upper airway cough syndrome (UACS), previously referred to as postnasal drip syndrome (PNDS), which is caused by variety of rhinosinus conditions; (2) asthma; (3) GERD; (4) chronic bronchitis from cigarette smoking or other inhaled environmental irritants; (5) nonasthmatic eosinophilic bronchitis; and (6) bronchiectasis.   These conditions, singly or in combination, have accounted for up to 94% of the causes of chronic cough in prospective studies.   Other conditions have constituted no >6% of the causes in prospective studies These have included bronchogenic carcinoma, chronic interstitial pneumonia, sarcoidosis, left ventricular failure, ACEI-induced cough, and aspiration from a condition associated with pharyngeal dysfunction.   This is most likely  Classic Upper airway cough syndrome, so named because it's frequently impossible to sort out how much is  CR/sinusitis with freq throat clearing (which can be related to primary GERD)   vs  causing  secondary (" extra esophageal")  GERD from wide swings in gastric pressure that occur with throat clearing, often  promoting self use of mint and menthol lozenges that reduce the lower esophageal sphincter tone and exacerbate the problem further in a cyclical fashion.   These are the same pts who not infrequently have failed to tolerate ace inhibitors,  dry powder inhalers or biphosphonates or report having reflux symptoms that don't respond to standard doses of PPI , and are easily confused as having aecopd or asthma flares,   Of note of the three most common causes of chronic cough, only one (GERD)  can actually cause the other two (asthma and post nasal drip syndrome)  and perpetuate the cylce of cough inducing airway trauma, inflammation, heightened sensitivity to reflux which is prompted by the cough itself via a cyclical mechanism.    This may partially respond to  steroids and look like asthma and post nasal drainage but never erradicated completely unless the cough and the secondary reflux are eliminated, preferably both at the same time.  While not intuitively obvious, many patients with chronic low grade reflux do not cough until there is a secondary insult that disturbs the protective epithelial barrier and exposes sensitive nerve endings.  This can be viral or direct physical injury such as with an endotracheal tube.   The point is that once this occurs, it is difficult to eliminate using anything but a maximally effective acid suppression regimen at least in the short run, accompanied by an appropriate diet to address non acid GERD.   See instructions for specific recommendations which were reviewed directly with the patient who was given a copy with highlighter outlining the key components.

## 2011-01-02 NOTE — Assessment & Plan Note (Signed)
Reassuring that CT reported neg at [facility] w/in last month so unlikely cough is related to this or chemo but worth noting the cough started while on chemo per [facility] and they are aware of the cough issue per pt.

## 2011-01-12 ENCOUNTER — Institutional Professional Consult (permissible substitution): Payer: BC Managed Care – PPO | Admitting: Internal Medicine

## 2011-01-17 ENCOUNTER — Encounter: Payer: Self-pay | Admitting: Internal Medicine

## 2011-01-17 ENCOUNTER — Ambulatory Visit (INDEPENDENT_AMBULATORY_CARE_PROVIDER_SITE_OTHER): Payer: BC Managed Care – PPO | Admitting: Internal Medicine

## 2011-01-17 DIAGNOSIS — R9389 Abnormal findings on diagnostic imaging of other specified body structures: Secondary | ICD-10-CM

## 2011-01-17 DIAGNOSIS — R059 Cough, unspecified: Secondary | ICD-10-CM

## 2011-01-17 DIAGNOSIS — R05 Cough: Secondary | ICD-10-CM

## 2011-01-17 MED ORDER — LEVOFLOXACIN 750 MG PO TABS: 750.0000 mg | ORAL_TABLET | Freq: Every day | ORAL | Status: AC

## 2011-01-17 MED ORDER — RANITIDINE HCL 75 MG PO TABS: ORAL_TABLET | ORAL | Status: DC

## 2011-01-17 MED ORDER — PANTOPRAZOLE SODIUM 40 MG PO TBEC: 40.0000 mg | DELAYED_RELEASE_TABLET | Freq: Every day | ORAL | Status: DC

## 2011-01-17 NOTE — Patient Instructions (Addendum)
If not satisfied the next step is to take levaquin x 750 mg per day x  10days and if not then need sinus ct is next and call Libby [phone] to set this up before next visit.  Be aware that levaquin cause your tendons to ache and if so stop it right away  Please schedule a follow up office visit in 6 weeks, call sooner if needed

## 2011-01-17 NOTE — Progress Notes (Signed)
  Subjective:    Patient ID: Angela Hart, female    DOB: 27-Aug-1950, 60 y.o.   MRN: [id number]  HPI  3 yowf never smoker  On chemo for colon ca Stage III last rx early Sept 2012 with h/o recurrent sinus infections onset in the 1980's s/p sinus surgery and still with sinus infections 3-4 year but no lower respiratory symptoms until August 2012 with refractory cough referred by Dr Earl Gala for pulmonary evaluation.  01/01/2011  Initial pulmonary office eval cc indolent onset gradually worse cough slt productive of yellow mucus esp in am but then mostly dry assoc with sob and discomfort center of chest worse with deep breath. Prednisone helped some and 2 different abx and tessilon helps some too but still present. Really not so sob as feels can't take a deep breath s setting off cough.  Still has some nasal congestion but "nothing like my sinus infections in past' CT chest at [facility] done 4 weeks prior to ov Nl per pt.  rec Try  Dexilant Take 30-60 min before first meal of the day and Zantac 150 mg  at bedtime until you return. GERD diet.  NO FISH OIL  Prednisone 10 mg take  4 each am x 2 days,   2 each am x 2 days,  1 each am x2days and stop  Take delsym two tsp every 12 hours and supplement if needed with  tramadol 50 mg up to 2 every 4 hours    01/17/2011 f/u ov/[name] cc better energy on prednisone but never stopped coughing although much better used no more than  one tramadol per day.   85% better on day of ov, not sure still needs delsym, never wakes up with mucus or night sweats.   Sleeping ok without nocturnal  or early am exacerbation  of respiratory  c/o's or need for noct saba. Also denies any obvious fluctuation of symptoms with weather or environmental changes or other aggravating or alleviating factors except as outlined above           Objective:   Physical Exam  Pleasant thin wf 133 01/01/2011  > 138 01/17/2011   Cough with insp intermittently   HEENT: nl dentition,  turbinates, and orophanx. Nl external ear canals without cough reflex   NECK :  without JVD/Nodes/TM/ nl carotid upstrokes bilaterally   LUNGS: no acc muscle use, clear to A and P bilaterally without cough on insp or exp maneuvers   CV:  RRR  no s3 or murmur or increase in P2, no edema   ABD:  soft and nontender with nl excursion in the supine position. No bruits or organomegaly, bowel sounds nl  MS:  warm without deformities, calf tenderness, cyanosis or clubbing        Assessment & Plan:

## 2011-01-18 ENCOUNTER — Encounter: Payer: Self-pay | Admitting: Internal Medicine

## 2011-01-18 DIAGNOSIS — R9389 Abnormal findings on diagnostic imaging of other specified body structures: Secondary | ICD-10-CM | POA: Insufficient documentation

## 2011-01-18 NOTE — Assessment & Plan Note (Signed)
Chronic cough is often simultaneously caused by more than one condition. A single cause has been found from 38 to 82% of the time, multiple causes from 18 to 62%. Multiply caused cough has been the result of three diseases up to 42% of the time.   However, based on 85% improvement in chronic cough p just a few weeks  inclined to continue treating for  Upper airway cough syndrome and f/u with ct sinuses if not better.  Marland Kitchen

## 2011-01-18 NOTE — Assessment & Plan Note (Addendum)
See CT [facility]  01/16/11 :  Bilateral tiny ground glass to nodular infiltrates both apices If evolving upper lobe changes while on chemo per [facility] for colon ca would make the most sense to me be be seen by [facility] re ? Need for fob vs vats bx but not clear to me that this is clinically signicant or related to cough at present

## 2011-01-25 ENCOUNTER — Telehealth: Payer: Self-pay | Admitting: Internal Medicine

## 2011-01-25 NOTE — Telephone Encounter (Signed)
PA for pantoprazole initiated with [facility] [employer] at [phone]. Member ID # [id number]. PA is under clinical review. Even generic PPI's require PA. We should receive a response within 48 to 72 hours. i will forward this msg to [place] so she can document once the approval or denial has been received.

## 2011-01-26 NOTE — Telephone Encounter (Signed)
Received form stating protonix denied. No alternatives listed. Please advise thanks!

## 2011-01-27 ENCOUNTER — Encounter: Payer: Self-pay | Admitting: Internal Medicine

## 2011-01-27 NOTE — Telephone Encounter (Signed)
We can try the PA route but in meantime she can certainly substitute otc prilosec for protonix

## 2011-01-29 NOTE — Telephone Encounter (Signed)
Pt states she is okay with the denial and OTC Prilosec. FYI-- Wants to let MW know that she feels the Levaquin 750mg  is working well and today is her last day.

## 2011-02-06 ENCOUNTER — Other Ambulatory Visit (INDEPENDENT_AMBULATORY_CARE_PROVIDER_SITE_OTHER): Payer: BC Managed Care – PPO

## 2011-02-06 ENCOUNTER — Encounter: Payer: Self-pay | Admitting: Adult Health

## 2011-02-06 ENCOUNTER — Ambulatory Visit (INDEPENDENT_AMBULATORY_CARE_PROVIDER_SITE_OTHER): Payer: BC Managed Care – PPO | Admitting: Adult Health

## 2011-02-06 VITALS — BP 96/60 | HR 74 | Temp 97.6°F | Ht 66.5 in | Wt 136.4 lb

## 2011-02-06 DIAGNOSIS — R05 Cough: Secondary | ICD-10-CM

## 2011-02-06 DIAGNOSIS — R059 Cough, unspecified: Secondary | ICD-10-CM

## 2011-02-06 LAB — SEDIMENTATION RATE: Sed Rate: 62 mm/hr — ABNORMAL HIGH (ref 0–22)

## 2011-02-06 NOTE — Assessment & Plan Note (Signed)
Cyclical cough ? Etiology  CT chest with ground glass opaciites in apices.  Recent PET neg for uptake in lungs.  Will check CT sinus , ESR   Tx for chronic cough triggers  Plan:  Cough control with delsym and tramadol  PND w/ chlor tabs  GERD w/ PPI and zantac  Hold on steroid and abx for now.  Follow CT sinus  follow up 2 weeks with Dr. Sherene Sires

## 2011-02-06 NOTE — Patient Instructions (Signed)
Try  Dexilant Take 30-60 min before first meal of the day and Zantac 150 mg  at bedtime until you return. GOAL IS NO COUGHING OR THROAT CLEARING Begin Prilosec 20mg  daily before meal  Zantac 150mg  At bedtime   GERD Diet-- NO MINT OR MENTHOL PRODUCTS SO NO COUGH DROPS  USE SUGARLESS CANDY INSTEAD (jolley ranchers or Stover's)   Take delsym two tsp every 12 hours and supplement if needed with  tramadol 50 mg up to 1-2 every 4 hours to suppress the urge to cough. Swallowing water or using ice chips/non mint and menthol containing candies (such as lifesavers or sugarless jolly ranchers) are also effective.  You should rest your voice and avoid activities that you know make you cough. Once you have eliminated the cough for 3 straight days try reducing the tramadol first,  then the delsym as tolerated.  (if tremulous stop lexapro for a few days) Begin Chlor tabs 4mg  1-2 every 4 hrs As needed  Tickle in throat, drainage Take Chlor tabs 4mg  2 At bedtime   Saline nasal rinses Twice daily   We are setting you up for CT sinus   follow up Dr. Sherene Sires  In 2 weeks and As needed

## 2011-02-06 NOTE — Progress Notes (Signed)
Subjective:    Patient ID: Angela Hart, female    DOB: 16-Mar-1950, 60 y.o.   MRN: [id number]  HPI 53 yowf never smoker  On chemo for colon ca Stage III last rx early Sept 2012 with h/o recurrent sinus infections onset in the 1980's s/p sinus surgery and still with sinus infections 3-4 year but no lower respiratory symptoms until August 2012 with refractory cough referred by Dr Earl Gala for pulmonary evaluation.  01/01/2011  Initial pulmonary office eval cc indolent onset gradually worse cough slt productive of yellow mucus esp in am but then mostly dry assoc with sob and discomfort center of chest worse with deep breath. Prednisone helped some and 2 different abx and tessilon helps some too but still present. Really not so sob as feels can't take a deep breath s setting off cough.  Still has some nasal congestion but "nothing like my sinus infections in past' CT chest at [facility] done 4 weeks prior to ov Nl per pt.  rec Try  Dexilant Take 30-60 min before first meal of the day and Zantac 150 mg  at bedtime until you return. GERD diet.  NO FISH OIL  Prednisone 10 mg take  4 each am x 2 days,   2 each am x 2 days,  1 each am x2days and stop  Take delsym two tsp every 12 hours and supplement if needed with  tramadol 50 mg up to 2 every 4 hours    01/17/2011 f/u ov/Wert cc better energy on prednisone but never stopped coughing although much better used no more than  one tramadol per day.   85% better on day of ov, not sure still needs delsym, never wakes up with mucus or night sweats.  >>Levaquin x 10 d   02/06/2011 Acute OV  Returns for persistent cough. Seen 3 weeks ago for persistent cough, tx with 10 d of Levaquin for possible underlying sinus infection. Got better with decreased cough but never went away. After abx finished cough has started to progressively return. Cough waxes Astrid Drafts- aggravated by cold air, talking, laughing .Feels something is in chest and throat at times. Does have mucus and  drainage - thick at times . She does have hx of chronic sinus dz. W/ prev sinus surgery in past.  Also has several pet birds in her home for years.  Recent CT 11/6  at [facility] following up on her colon cancer s/p resxn/chemo  showed  Bilateral tiny ground glass to nodular infiltrates both apices    Constitutional:   No  weight loss, night sweats,  Fevers, chills, + fatigue, or  lassitude.  HEENT:   No headaches,  Difficulty swallowing,  Tooth/dental problems, or  Sore throat,                No sneezing, itching, ear ache,  ++nasal congestion, post nasal drip,   CV:  No chest pain,  Orthopnea, PND, swelling in lower extremities, anasarca, dizziness, palpitations, syncope.   GI  No heartburn, indigestion, abdominal pain, nausea, vomiting, diarrhea, change in bowel habits, loss of appetite, bloody stools.   Resp:  No coughing up of blood.   No chest wall deformity  Skin: no rash or lesions.  GU: no dysuria, change in color of urine, no urgency or frequency.  No flank pain, no hematuria   MS:  No joint pain or swelling.  No decreased range of motion.  No back pain.  Psych:  No change in mood or affect.  No depression or anxiety.  No memory loss.    Objective:   Physical Exam  Pleasant thin wf 133 01/01/2011  > 138 01/17/2011 >136 02/06/2011   Cough with insp intermittently   HEENT: nl dentition, turbinates, and orophanx. Nl external ear canals without cough reflex   NECK :  without JVD/Nodes/TM/ nl carotid upstrokes bilaterally   LUNGS: no acc muscle use, clear to A and P     CV:  RRR  no s3 or murmur or increase in P2, no edema   ABD:  soft and nontender with nl excursion in the supine position. No bruits or organomegaly, bowel sounds nl  MS:  warm without deformities, calf tenderness, cyanosis or clubbing        Assessment & Plan:

## 2011-02-08 ENCOUNTER — Ambulatory Visit (INDEPENDENT_AMBULATORY_CARE_PROVIDER_SITE_OTHER)
Admission: RE | Admit: 2011-02-08 | Discharge: 2011-02-08 | Disposition: A | Discharge status: Home / Self Care | Payer: BC Managed Care – PPO | Source: Ambulatory Visit | Attending: Adult Health | Admitting: Adult Health

## 2011-02-08 DIAGNOSIS — R059 Cough, unspecified: Secondary | ICD-10-CM

## 2011-02-08 DIAGNOSIS — R05 Cough: Secondary | ICD-10-CM

## 2011-02-20 LAB — PULMONARY FUNCTION TEST

## 2011-02-28 ENCOUNTER — Ambulatory Visit: Payer: BC Managed Care – PPO | Admitting: Internal Medicine

## 2011-03-13 HISTORY — PX: TOTAL ABDOMINAL HYSTERECTOMY W/ BILATERAL SALPINGOOPHORECTOMY: SHX83

## 2011-03-15 ENCOUNTER — Encounter: Payer: Self-pay | Admitting: Adult Health

## 2011-03-16 ENCOUNTER — Telehealth: Payer: Self-pay | Admitting: Internal Medicine

## 2011-03-16 NOTE — Telephone Encounter (Signed)
Received copies from [facility],on 1.4.13. Forwarded 24 pages to Dr. Sherene Sires ,for review.  sj

## 2011-04-11 ENCOUNTER — Other Ambulatory Visit: Payer: Self-pay | Admitting: Physician Assistant

## 2011-04-23 ENCOUNTER — Encounter (INDEPENDENT_AMBULATORY_CARE_PROVIDER_SITE_OTHER): Payer: Self-pay | Admitting: General Surgery

## 2011-04-23 ENCOUNTER — Ambulatory Visit (INDEPENDENT_AMBULATORY_CARE_PROVIDER_SITE_OTHER): Payer: BC Managed Care – PPO | Admitting: General Surgery

## 2011-04-23 VITALS — BP 120/78 | HR 78 | Temp 99.0°F | Resp 18 | Ht 67.0 in | Wt 135.0 lb

## 2011-04-23 DIAGNOSIS — Z85038 Personal history of other malignant neoplasm of large intestine: Secondary | ICD-10-CM

## 2011-04-23 NOTE — Progress Notes (Signed)
Operation:  Laparoscopic left colectomy for transverse colon cancer  Date:  April 12, 2010  Stage:  T4  HPI:  Angela Hart is here for long-term followup of her transverse colon cancer. She has completed her chemotherapy. She had some pulmonary toxicity from this.   Her CT scans and PET show no evidence of metastatic or recurrent disease.  PE:  Gen.-thin female, in no acute distress.  Neck-Port-A-Cath seen going into the right lower neck. No cervical adenopathy.  Abdomen-soft, nontender, no organomegaly, well-healed lower midline scar.  Assessment:  T4N0 transverse colon cancer-no evidence of recurrence.  Plan:   Port-A-Cath removal.  The procedure and risks were discussed with her.  Risks include but are not limited to bleeding, infection, wound healing problems, anesthesia. She seems to understand this and agrees with the plan.

## 2011-04-23 NOTE — Patient Instructions (Signed)
Stop your fish oil 5 days before the surgery.

## 2011-05-03 DIAGNOSIS — Z452 Encounter for adjustment and management of vascular access device: Secondary | ICD-10-CM

## 2011-05-03 HISTORY — PX: PORT-A-CATH REMOVAL: SHX5289

## 2011-05-17 ENCOUNTER — Encounter (INDEPENDENT_AMBULATORY_CARE_PROVIDER_SITE_OTHER): Payer: Self-pay | Admitting: General Surgery

## 2011-06-05 ENCOUNTER — Encounter (INDEPENDENT_AMBULATORY_CARE_PROVIDER_SITE_OTHER): Payer: Self-pay | Admitting: General Surgery

## 2011-06-05 ENCOUNTER — Ambulatory Visit (INDEPENDENT_AMBULATORY_CARE_PROVIDER_SITE_OTHER): Payer: BC Managed Care – PPO | Admitting: General Surgery

## 2011-06-05 VITALS — BP 108/67 | HR 72 | Temp 98.4°F | Ht 67.0 in | Wt 138.0 lb

## 2011-06-05 DIAGNOSIS — Z9889 Other specified postprocedural states: Secondary | ICD-10-CM

## 2011-06-05 NOTE — Progress Notes (Signed)
She is here for a postop visit after Port-a-cath removal.  She is doing well.  She is happy it is out.  Exam:  Right chest wall incision is clean and intact.  Assess:  Wound healing well.  Plan:  Return prn.

## 2011-06-05 NOTE — Patient Instructions (Signed)
Keep scar out of direct sunlight for 3 months.

## 2011-08-08 ENCOUNTER — Other Ambulatory Visit: Payer: Self-pay | Admitting: Physician Assistant

## 2011-12-06 ENCOUNTER — Encounter (HOSPITAL_COMMUNITY): Payer: Self-pay | Admitting: Pharmacist

## 2011-12-10 ENCOUNTER — Other Ambulatory Visit: Payer: Self-pay | Admitting: Obstetrics and Gynecology

## 2011-12-11 ENCOUNTER — Inpatient Hospital Stay (HOSPITAL_COMMUNITY): Admission: RE | Admit: 2011-12-11 | Payer: BC Managed Care – PPO | Source: Ambulatory Visit

## 2011-12-12 ENCOUNTER — Encounter (HOSPITAL_COMMUNITY)
Admission: RE | Admit: 2011-12-12 | Discharge: 2011-12-12 | Disposition: A | Discharge status: Home / Self Care | Payer: BC Managed Care – PPO | Source: Ambulatory Visit | Attending: Obstetrics and Gynecology | Admitting: Obstetrics and Gynecology

## 2011-12-12 ENCOUNTER — Encounter (HOSPITAL_COMMUNITY): Payer: Self-pay

## 2011-12-12 HISTORY — DX: Unspecified asthma, uncomplicated: J45.909

## 2011-12-12 HISTORY — DX: Myoneural disorder, unspecified: G70.9

## 2011-12-12 HISTORY — DX: Anxiety disorder, unspecified: F41.9

## 2011-12-12 HISTORY — DX: Gastro-esophageal reflux disease without esophagitis: K21.9

## 2011-12-12 LAB — CBC
HCT: 37.4 % (ref 36.0–46.0)
MCV: 97.7 fL (ref 78.0–100.0)
Platelets: 318 10*3/uL (ref 150–400)
RBC: 3.83 MIL/uL — ABNORMAL LOW (ref 3.87–5.11)
WBC: 5.1 10*3/uL (ref 4.0–10.5)

## 2011-12-12 LAB — COMPREHENSIVE METABOLIC PANEL
AST: 30 U/L (ref 0–37)
Albumin: 3.7 g/dL (ref 3.5–5.2)
Alkaline Phosphatase: 82 U/L (ref 39–117)
BUN: 13 mg/dL (ref 6–23)
CO2: 32 mEq/L (ref 19–32)
Chloride: 99 mEq/L (ref 96–112)
Creatinine, Ser: 0.72 mg/dL (ref 0.50–1.10)
GFR calc non Af Amer: >90 mL/min (ref >90)
Potassium: 3.8 mEq/L (ref 3.5–5.1)
Total Bilirubin: 0.2 mg/dL — ABNORMAL LOW (ref 0.3–1.2)

## 2011-12-12 LAB — SURGICAL PCR SCREEN: Staphylococcus aureus: NEGATIVE

## 2011-12-12 NOTE — Pre-Procedure Instructions (Signed)
Patient's history of blood transfusion given to lab at [facility].

## 2011-12-12 NOTE — Patient Instructions (Addendum)
   Your procedure is scheduled on: Friday, Oct 11  Enter through the Main Entrance of [facility] at: 6 am Pick up the phone at the desk and dial [phone] and inform us of your arrival.  Please call this number if you have any problems the morning of surgery: [phone]  Remember: Do not eat food after midnight: Thursday Do not drink clear liquids after: Thursday Take these medicines the morning of surgery with a SIP OF WATER: zantac, wellbutrin, lexapro  Do not wear jewelry, make-up, or FINGER nail polish No metal in your hair or on your body. Do not wear lotions, powders, perfumes. You may wear deodorant.  Please use your CHG wash as directed prior to surgery.  Do not shave anywhere for at least 12 hours prior to first CHG shower.  Do not bring valuables to the hospital. Contacts, dentures or bridgework may not be worn into surgery.  Leave suitcase in the car. After Surgery it may be brought to your room. For patients being admitted to the hospital, checkout time is 11:00am the day of discharge. Home with husband Bruce  cell [phone].  Patients discharged on the day of surgery will not be allowed to drive home.

## 2011-12-21 ENCOUNTER — Encounter (HOSPITAL_COMMUNITY): Payer: Self-pay | Admitting: Anesthesiology

## 2011-12-21 ENCOUNTER — Encounter (HOSPITAL_COMMUNITY)
Admission: AD | Disposition: A | Discharge status: Home / Self Care | Payer: Self-pay | Source: Ambulatory Visit | Attending: Obstetrics and Gynecology

## 2011-12-21 ENCOUNTER — Encounter (HOSPITAL_COMMUNITY): Payer: Self-pay | Admitting: *Deleted

## 2011-12-21 ENCOUNTER — Ambulatory Visit (HOSPITAL_COMMUNITY): Payer: BC Managed Care – PPO | Admitting: Anesthesiology

## 2011-12-21 ENCOUNTER — Ambulatory Visit (HOSPITAL_COMMUNITY)
Admission: AD | Admit: 2011-12-21 | Discharge: 2011-12-22 | Disposition: A | Discharge status: Home / Self Care | Payer: BC Managed Care – PPO | Source: Ambulatory Visit | Attending: Obstetrics and Gynecology | Admitting: Obstetrics and Gynecology

## 2011-12-21 DIAGNOSIS — Z1509 Genetic susceptibility to other malignant neoplasm: Secondary | ICD-10-CM | POA: Insufficient documentation

## 2011-12-21 DIAGNOSIS — Z23 Encounter for immunization: Secondary | ICD-10-CM | POA: Insufficient documentation

## 2011-12-21 DIAGNOSIS — N841 Polyp of cervix uteri: Secondary | ICD-10-CM | POA: Insufficient documentation

## 2011-12-21 DIAGNOSIS — Z85038 Personal history of other malignant neoplasm of large intestine: Secondary | ICD-10-CM | POA: Insufficient documentation

## 2011-12-21 DIAGNOSIS — Z90710 Acquired absence of both cervix and uterus: Secondary | ICD-10-CM

## 2011-12-21 DIAGNOSIS — D252 Subserosal leiomyoma of uterus: Secondary | ICD-10-CM | POA: Insufficient documentation

## 2011-12-21 DIAGNOSIS — Z1507 Genetic susceptibility to malignant neoplasm of urinary tract: Secondary | ICD-10-CM

## 2011-12-21 HISTORY — DX: Genetic susceptibility to other malignant neoplasm: Z15.09

## 2011-12-21 SURGERY — ROBOTIC ASSISTED TOTAL HYSTERECTOMY WITH BILATERAL SALPINGO OOPHORECTOMY
Anesthesia: General | Laterality: Bilateral | Wound class: Clean Contaminated

## 2011-12-21 MED ORDER — CEFAZOLIN SODIUM-DEXTROSE 2-3 GM-% IV SOLR
INTRAVENOUS | Status: AC
  Filled 2011-12-21: qty 50

## 2011-12-21 MED ORDER — MIDAZOLAM HCL 5 MG/5ML IJ SOLN
INTRAMUSCULAR | Status: DC | PRN
  Administered 2011-12-21: 2 mg via INTRAVENOUS

## 2011-12-21 MED ORDER — PANTOPRAZOLE SODIUM 40 MG PO TBEC
DELAYED_RELEASE_TABLET | ORAL | Status: AC
  Administered 2011-12-21: 40 mg via ORAL
  Filled 2011-12-21: qty 1

## 2011-12-21 MED ORDER — DEXAMETHASONE SODIUM PHOSPHATE 10 MG/ML IJ SOLN
INTRAMUSCULAR | Status: AC
  Filled 2011-12-21: qty 1

## 2011-12-21 MED ORDER — PROPOFOL 10 MG/ML IV EMUL
INTRAVENOUS | Status: AC
  Filled 2011-12-21: qty 20

## 2011-12-21 MED ORDER — DIPHENHYDRAMINE HCL 50 MG/ML IJ SOLN: 12.5000 mg | Freq: Four times a day (QID) | INTRAMUSCULAR | Status: DC | PRN

## 2011-12-21 MED ORDER — SCOPOLAMINE 1 MG/3DAYS TD PT72
MEDICATED_PATCH | TRANSDERMAL | Status: AC
  Filled 2011-12-21: qty 1

## 2011-12-21 MED ORDER — NEOSTIGMINE METHYLSULFATE 1 MG/ML IJ SOLN
INTRAMUSCULAR | Status: DC | PRN
  Administered 2011-12-21: 5 mg via INTRAVENOUS

## 2011-12-21 MED ORDER — ZOLPIDEM TARTRATE 5 MG PO TABS: 5.0000 mg | ORAL_TABLET | Freq: Every evening | ORAL | Status: DC | PRN

## 2011-12-21 MED ORDER — FENTANYL CITRATE 0.05 MG/ML IJ SOLN
INTRAMUSCULAR | Status: AC
  Filled 2011-12-21: qty 5

## 2011-12-21 MED ORDER — HYDROMORPHONE 0.3 MG/ML IV SOLN
INTRAVENOUS | Status: DC
  Administered 2011-12-21: 0.6 mg via INTRAVENOUS
  Administered 2011-12-21: 13:00:00 via INTRAVENOUS
  Administered 2011-12-21: 2 mg via INTRAVENOUS
  Administered 2011-12-21: 3.3 mg via INTRAVENOUS
  Filled 2011-12-21: qty 25

## 2011-12-21 MED ORDER — FENTANYL CITRATE 0.05 MG/ML IJ SOLN
INTRAMUSCULAR | Status: DC | PRN
  Administered 2011-12-21: 100 ug via INTRAVENOUS
  Administered 2011-12-21: 50 ug via INTRAVENOUS
  Administered 2011-12-21: 100 ug via INTRAVENOUS
  Administered 2011-12-21 (×3): 50 ug via INTRAVENOUS

## 2011-12-21 MED ORDER — ROCURONIUM BROMIDE 100 MG/10ML IV SOLN
INTRAVENOUS | Status: DC | PRN
  Administered 2011-12-21: 20 mg via INTRAVENOUS
  Administered 2011-12-21: 40 mg via INTRAVENOUS
  Administered 2011-12-21: 10 mg via INTRAVENOUS

## 2011-12-21 MED ORDER — ONDANSETRON HCL 4 MG/2ML IJ SOLN
INTRAMUSCULAR | Status: AC
  Filled 2011-12-21: qty 2

## 2011-12-21 MED ORDER — ONDANSETRON HCL 4 MG/2ML IJ SOLN: 4.0000 mg | Freq: Four times a day (QID) | INTRAMUSCULAR | Status: DC | PRN

## 2011-12-21 MED ORDER — NEOSTIGMINE METHYLSULFATE 1 MG/ML IJ SOLN
INTRAMUSCULAR | Status: AC
  Filled 2011-12-21: qty 10

## 2011-12-21 MED ORDER — SODIUM CHLORIDE 0.9 % IJ SOLN: 9.0000 mL | INTRAMUSCULAR | Status: DC | PRN

## 2011-12-21 MED ORDER — HYDROMORPHONE HCL PF 1 MG/ML IJ SOLN
INTRAMUSCULAR | Status: AC
  Administered 2011-12-21: 0.25 mg via INTRAVENOUS
  Filled 2011-12-21: qty 1

## 2011-12-21 MED ORDER — TRAMADOL HCL 50 MG PO TABS
50.0000 mg | ORAL_TABLET | Freq: Four times a day (QID) | ORAL | Status: DC | PRN
  Administered 2011-12-22 (×2): 50 mg via ORAL
  Filled 2011-12-21 (×2): qty 1

## 2011-12-21 MED ORDER — ONDANSETRON HCL 4 MG/2ML IJ SOLN
INTRAMUSCULAR | Status: DC | PRN
  Administered 2011-12-21: 4 mg via INTRAVENOUS

## 2011-12-21 MED ORDER — LIDOCAINE HCL (CARDIAC) 20 MG/ML IV SOLN
INTRAVENOUS | Status: DC | PRN
  Administered 2011-12-21: 20 mg via INTRAVENOUS

## 2011-12-21 MED ORDER — DEXAMETHASONE SODIUM PHOSPHATE 4 MG/ML IJ SOLN
INTRAMUSCULAR | Status: DC | PRN
  Administered 2011-12-21: 10 mg via INTRAVENOUS

## 2011-12-21 MED ORDER — DEXTROSE-NACL 5-0.45 % IV SOLN: INTRAVENOUS | Status: DC

## 2011-12-21 MED ORDER — BUPIVACAINE HCL (PF) 0.25 % IJ SOLN
INTRAMUSCULAR | Status: DC | PRN
  Administered 2011-12-21: 12 mL

## 2011-12-21 MED ORDER — ARTIFICIAL TEARS OP OINT
TOPICAL_OINTMENT | OPHTHALMIC | Status: AC
  Filled 2011-12-21: qty 3.5

## 2011-12-21 MED ORDER — NALOXONE HCL 0.4 MG/ML IJ SOLN: 0.4000 mg | INTRAMUSCULAR | Status: DC | PRN

## 2011-12-21 MED ORDER — BUPIVACAINE HCL (PF) 0.25 % IJ SOLN
INTRAMUSCULAR | Status: AC
  Filled 2011-12-21: qty 30

## 2011-12-21 MED ORDER — EPHEDRINE SULFATE 50 MG/ML IJ SOLN
INTRAMUSCULAR | Status: DC | PRN
  Administered 2011-12-21 (×2): 10 mg via INTRAVENOUS
  Administered 2011-12-21: 5 mg via INTRAVENOUS

## 2011-12-21 MED ORDER — HYDROMORPHONE HCL PF 1 MG/ML IJ SOLN
0.2500 mg | INTRAMUSCULAR | Status: DC | PRN
  Administered 2011-12-21 (×3): 0.25 mg via INTRAVENOUS
  Administered 2011-12-21 (×2): 0.5 mg via INTRAVENOUS

## 2011-12-21 MED ORDER — PROPOFOL 10 MG/ML IV EMUL
INTRAVENOUS | Status: DC | PRN
  Administered 2011-12-21: 200 mg via INTRAVENOUS

## 2011-12-21 MED ORDER — MIDAZOLAM HCL 2 MG/2ML IJ SOLN
INTRAMUSCULAR | Status: AC
  Filled 2011-12-21: qty 2

## 2011-12-21 MED ORDER — GLYCOPYRROLATE 0.2 MG/ML IJ SOLN
INTRAMUSCULAR | Status: AC
  Filled 2011-12-21: qty 1

## 2011-12-21 MED ORDER — GLYCOPYRROLATE 0.2 MG/ML IJ SOLN
INTRAMUSCULAR | Status: AC
  Filled 2011-12-21: qty 4

## 2011-12-21 MED ORDER — LIDOCAINE HCL (CARDIAC) 20 MG/ML IV SOLN
INTRAVENOUS | Status: AC
  Filled 2011-12-21: qty 5

## 2011-12-21 MED ORDER — ROCURONIUM BROMIDE 50 MG/5ML IV SOLN
INTRAVENOUS | Status: AC
  Filled 2011-12-21: qty 1

## 2011-12-21 MED ORDER — LACTATED RINGERS IV SOLN
INTRAVENOUS | Status: DC
  Administered 2011-12-21: 125 mL/h via INTRAVENOUS
  Administered 2011-12-21 (×2): via INTRAVENOUS

## 2011-12-21 MED ORDER — PANTOPRAZOLE SODIUM 40 MG PO TBEC
40.0000 mg | DELAYED_RELEASE_TABLET | Freq: Once | ORAL | Status: AC
  Administered 2011-12-21: 40 mg via ORAL

## 2011-12-21 MED ORDER — CEFAZOLIN SODIUM-DEXTROSE 2-3 GM-% IV SOLR
2.0000 g | INTRAVENOUS | Status: AC
  Administered 2011-12-21: 2 g via INTRAVENOUS

## 2011-12-21 MED ORDER — DIPHENHYDRAMINE HCL 12.5 MG/5ML PO ELIX: 12.5000 mg | ORAL_SOLUTION | Freq: Four times a day (QID) | ORAL | Status: DC | PRN

## 2011-12-21 MED ORDER — RINGERS IRRIGATION IR SOLN
Status: DC | PRN
  Administered 2011-12-21: 1

## 2011-12-21 MED ORDER — GLYCOPYRROLATE 0.2 MG/ML IJ SOLN
INTRAMUSCULAR | Status: DC | PRN
  Administered 2011-12-21: 0.2 mg via INTRAVENOUS
  Administered 2011-12-21: 1 mg via INTRAVENOUS

## 2011-12-21 SURGICAL SUPPLY — 72 items
ADH SKN CLS APL DERMABOND .7 (GAUZE/BANDAGES/DRESSINGS) ×1
BAG URINE DRAINAGE (UROLOGICAL SUPPLIES) ×2 IMPLANT
BARRIER ADHS 3X4 INTERCEED (GAUZE/BANDAGES/DRESSINGS) ×2 IMPLANT
BRR ADH 4X3 ABS CNTRL BYND (GAUZE/BANDAGES/DRESSINGS) ×1
CABLE HIGH FREQUENCY MONO STRZ (ELECTRODE) ×2 IMPLANT
CATH FOLEY 3WAY  5CC 16FR (CATHETERS) ×1
CATH FOLEY 3WAY 5CC 16FR (CATHETERS) ×1 IMPLANT
CHLORAPREP W/TINT 26ML (MISCELLANEOUS) ×2 IMPLANT
CLOTH BEACON ORANGE TIMEOUT ST (SAFETY) ×2 IMPLANT
CONT PATH 16OZ SNAP LID 3702 (MISCELLANEOUS) ×2 IMPLANT
COVER MAYO STAND STRL (DRAPES) ×2 IMPLANT
COVER TABLE BACK 60X90 (DRAPES) ×4 IMPLANT
COVER TIP SHEARS 8 DVNC (MISCELLANEOUS) ×1 IMPLANT
COVER TIP SHEARS 8MM DA VINCI (MISCELLANEOUS) ×1
DECANTER SPIKE VIAL GLASS SM (MISCELLANEOUS) ×2 IMPLANT
DERMABOND ADVANCED (GAUZE/BANDAGES/DRESSINGS) ×1
DERMABOND ADVANCED .7 DNX12 (GAUZE/BANDAGES/DRESSINGS) ×1 IMPLANT
DRAPE HUG U DISPOSABLE (DRAPE) ×2 IMPLANT
DRAPE LG THREE QUARTER DISP (DRAPES) ×4 IMPLANT
DRAPE MONITOR DA VINCI (DRAPE) IMPLANT
DRAPE WARM FLUID 44X44 (DRAPE) ×2 IMPLANT
ELECT REM PT RETURN 9FT ADLT (ELECTROSURGICAL) ×2
ELECTRODE REM PT RTRN 9FT ADLT (ELECTROSURGICAL) ×1 IMPLANT
EVACUATOR SMOKE 8.L (FILTER) ×2 IMPLANT
GAUZE VASELINE 3X9 (GAUZE/BANDAGES/DRESSINGS) IMPLANT
GLOVE BIO SURGEON STRL SZ7.5 (GLOVE) ×6 IMPLANT
GOWN STRL REIN XL XLG (GOWN DISPOSABLE) ×12 IMPLANT
GYRUS RUMI II 2.5CM BLUE (DISPOSABLE) ×2
GYRUS RUMI II 3.5CM BLUE (DISPOSABLE)
GYRUS RUMI II 4.0CM BLUE (DISPOSABLE)
KIT ACCESSORY DA VINCI DISP (KITS) ×1
KIT ACCESSORY DVNC DISP (KITS) ×1 IMPLANT
KIT DISP ACCESSORY 4 ARM (KITS) IMPLANT
NDL INSUFFLATION 14GA 120MM (NEEDLE) ×1 IMPLANT
NEEDLE INSUFFLATION 14GA 120MM (NEEDLE) ×2 IMPLANT
PACK LAVH (CUSTOM PROCEDURE TRAY) ×2 IMPLANT
PAD PREP 24X48 CUFFED NSTRL (MISCELLANEOUS) ×4 IMPLANT
PLUG CATH AND CAP STER (CATHETERS) ×2 IMPLANT
PROTECTOR NERVE ULNAR (MISCELLANEOUS) ×4 IMPLANT
RUMI II 3.0CM BLUE KOH-EFFICIE (DISPOSABLE) IMPLANT
RUMI II GYRUS 2.5CM BLUE (DISPOSABLE) IMPLANT
RUMI II GYRUS 3.5CM BLUE (DISPOSABLE) IMPLANT
RUMI II GYRUS 4.0CM BLUE (DISPOSABLE) IMPLANT
SCISSORS LAP 5X35 DISP (ENDOMECHANICALS) ×1 IMPLANT
SET CYSTO W/LG BORE CLAMP LF (SET/KITS/TRAYS/PACK) IMPLANT
SET IRRIG TUBING LAPAROSCOPIC (IRRIGATION / IRRIGATOR) ×2 IMPLANT
SOLUTION ELECTROLUBE (MISCELLANEOUS) ×2 IMPLANT
SPONGE LAP 18X18 X RAY DECT (DISPOSABLE) IMPLANT
SUT VIC AB 0 CT1 27 (SUTURE) ×4
SUT VIC AB 0 CT1 27XBRD ANBCTR (SUTURE) ×2 IMPLANT
SUT VIC AB 0 CT1 27XBRD ANTBC (SUTURE) IMPLANT
SUT VICRYL 0 UR6 27IN ABS (SUTURE) ×2 IMPLANT
SUT VICRYL RAPIDE 4/0 PS 2 (SUTURE) ×4 IMPLANT
SUT VLOC 180 0 9IN  GS21 (SUTURE) ×1
SUT VLOC 180 0 9IN GS21 (SUTURE) IMPLANT
SYR 50ML LL SCALE MARK (SYRINGE) ×2 IMPLANT
SYRINGE 10CC LL (SYRINGE) ×2 IMPLANT
SYSTEM CONVERTIBLE TROCAR (TROCAR) IMPLANT
TIP UTERINE 5.1X6CM LAV DISP (MISCELLANEOUS) IMPLANT
TIP UTERINE 6.7X10CM GRN DISP (MISCELLANEOUS) IMPLANT
TIP UTERINE 6.7X6CM WHT DISP (MISCELLANEOUS) ×1 IMPLANT
TIP UTERINE 6.7X8CM BLUE DISP (MISCELLANEOUS) IMPLANT
TOWEL OR 17X24 6PK STRL BLUE (TOWEL DISPOSABLE) ×6 IMPLANT
TROCAR DISP BLADELESS 8 DVNC (TROCAR) ×1 IMPLANT
TROCAR DISP BLADELESS 8MM (TROCAR) ×1
TROCAR XCEL 12X100 BLDLESS (ENDOMECHANICALS) IMPLANT
TROCAR XCEL NON-BLD 5MMX100MML (ENDOMECHANICALS) ×2 IMPLANT
TROCAR Z-THREAD 12X150 (TROCAR) ×2 IMPLANT
TROCAR Z-THREAD FIOS 12X100MM (TROCAR) IMPLANT
TUBING FILTER THERMOFLATOR (ELECTROSURGICAL) ×2 IMPLANT
WARMER LAPAROSCOPE (MISCELLANEOUS) ×2 IMPLANT
WATER STERILE IRR 1000ML POUR (IV SOLUTION) ×6 IMPLANT

## 2011-12-21 NOTE — Transfer of Care (Signed)
Immediate Anesthesia Transfer of Care Note  Patient: Angela Hart  Procedure(s) Performed: Procedure(s) (LRB) with comments: ROBOTIC ASSISTED TOTAL HYSTERECTOMY WITH BILATERAL SALPINGO OOPHERECTOMY (Bilateral)  Patient Location: PACU  Anesthesia Type: General  Level of Consciousness: awake, alert  and oriented  Airway & Oxygen Therapy: Patient Spontanous Breathing and Patient connected to nasal cannula oxygen  Post-op Assessment: Report given to PACU RN, Post -op Vital signs reviewed and stable and Patient moving all extremities X 4  Post vital signs: Reviewed and stable  Complications: No apparent anesthesia complications

## 2011-12-21 NOTE — Anesthesia Postprocedure Evaluation (Signed)
  Anesthesia Post-op Note  Patient: Angela Hart  Procedure(s) Performed: Procedure(s) (LRB) with comments: ROBOTIC ASSISTED TOTAL HYSTERECTOMY WITH BILATERAL SALPINGO OOPHERECTOMY (Bilateral)  Patient Location: Women's Unit  Anesthesia Type: General  Level of Consciousness: awake, alert  and oriented  Airway and Oxygen Therapy: Patient Spontanous Breathing and Patient connected to nasal cannula oxygen  Post-op Pain: mild  Post-op Assessment: Post-op Vital signs reviewed, Patient's Cardiovascular Status Stable and Pain level controlled  Post-op Vital Signs: Reviewed and stable  Complications: No apparent anesthesia complications

## 2011-12-21 NOTE — Progress Notes (Signed)
Patient ID: Angela Hart, female   DOB: 03/19/1950, 60 y.o.   MRN: [id number] Patient seen and examined. Consent witnessed and signed. No changes noted. Update completed.

## 2011-12-21 NOTE — H&P (Signed)
NAMEETIENNE, JOSS              ACCOUNT NO.:  [id number]  MEDICAL RECORD NO.:  [id number]  LOCATION:                                 FACILITY:  PHYSICIAN:  Lenoard Aden, M.D.DATE OF BIRTH:  1950/07/27  DATE OF ADMISSION:  12/21/2011 DATE OF DISCHARGE:                             HISTORY & PHYSICAL   CHIEF COMPLAINT:  History of colon cancer with genetic testing consistent with Lynch syndrome.  HISTORY OF PRESENT ILLNESS:  She has a 15-year 30% lifetime risk of endometrial and ovarian cancer risk of 34% repetitive therapy in the form of TAHBSO.  ALLERGIES:  She has no known drug allergies.  MEDICATIONS:  Omega mega-3 fatty acids, multivitamin, calcium glucosamine, vitamin C, Lexapro, Wellbutrin, Symbicort as needed, magnesium, and CQ10.  SOCIAL HISTORY:  She is a nonsmoker, nondrinker.  She denies domestic or physical violence.  She has a history of four abortions and one child.  SURGICAL HISTORY:  Remarkable for partial colectomy in 2012, hammertoe operation, bunionectomy, ovarian cyst removal 1985, and foot surgery.  FAMILY HISTORY:  She has a family history of heart disease.  PHYSICAL EXAMINATION:  GENERAL:  She is a well-developed, well- nourished, white female. VITAL SIGNS:  Height is 5 feet 6 inches, weight 140 pounds. HEENT:  Normal. NECK:  Supple.  Full range of motion. LUNGS:  Clear to auscultation. HEART:  Regular rhythm. ABDOMEN:  Soft, scaphoid nontender with a well-healed midline. PELVIC:  Reveals the uterus to be normal size, shape, and contour. Anteflexed with no adnexal masses. EXTREMITIES:  There are no cords. NEUROLOGIC:  Nonfocal. SKIN:  Intact.  IMPRESSION: 1. History of colon cancer. 2. History of Lynch syndrome with elevated lifetime risk of     endometrial and ovarian cancer for definitive therapy.  PLAN:  Proceed with da Vinci assisted total laparoscopic hysterectomy, bilateral salpingectomy, bilateral oophorectomy.  Risks of  anesthesia, infection, bleeding, injury to abdominal organs, need for repair was discussed, delayed versus immediate complications to include bowel and bladder injury.  Dr. Joselyn Glassman, who performed the patient's colorectal surgery, we will have general surgery standby to coapt adhesion and the patient in conscious sedation and the possibility of open laparotomy will proceed as noted.     Lenoard Aden, M.D.     RJT/MEDQ  D:  12/20/2011  T:  12/20/2011  Job:  [id number]

## 2011-12-21 NOTE — Addendum Note (Signed)
Addendum  created 12/21/11 1556 by Graciela Husbands, CRNA   Modules edited:Notes Section

## 2011-12-21 NOTE — Anesthesia Postprocedure Evaluation (Signed)
Anesthesia Post Note  Patient: Angela Hart  Procedure(s) Performed: Procedure(s) (LRB): ROBOTIC ASSISTED TOTAL HYSTERECTOMY WITH BILATERAL SALPINGO OOPHERECTOMY (Bilateral)  Anesthesia type: General  Patient location: PACU  Post pain: Pain level controlled  Post assessment: Post-op Vital signs reviewed  Last Vitals:  Filed Vitals:   12/21/11 1145  BP: 102/52  Pulse: 67  Temp: 36.7 C  Resp: 16    Post vital signs: Reviewed  Level of consciousness: sedated  Complications: No apparent anesthesia complications

## 2011-12-21 NOTE — Op Note (Signed)
12/21/2011  9:15 AM  PATIENT:  Angela Hart  61 y.o. female  PRE-OPERATIVE DIAGNOSIS:  Lynch Syndrome History of Colorectal cancer s/p colectomy  POST-OPERATIVE DIAGNOSIS:  Lynch Syndrome Pelvic adhesions Enterocele  PROCEDURE:  Procedure(s): ROBOTIC ASSISTED TOTAL HYSTERECTOMY WITH BILATERAL SALPINGO OOPHERECTOMY Lysis of Adhesions Cul-de-plasty  SURGEON:  Surgeon(s): Lenoard Aden, MD Robley Fries, MD  ASSISTANTS: Juliene Pina   ANESTHESIA:   local and general  ESTIMATED BLOOD LOSS: * No blood loss amount entered *   DRAINS: Urinary Catheter (Foley)   LOCAL MEDICATIONS USED:  BUPIVICAINE   SPECIMEN:  Source of Specimen:  uterus and cervix  DISPOSITION OF SPECIMEN:  PATHOLOGY  COUNTS:  YES  DICTATION #: [id number]  PLAN OF CARE: DC  home  PATIENT DISPOSITION:  PACU - hemodynamically stable.

## 2011-12-21 NOTE — Anesthesia Preprocedure Evaluation (Signed)
Anesthesia Evaluation  Patient identified by MRN, date of birth, ID band Patient awake    Reviewed: Allergy & Precautions, H&P , Patient's Chart, lab work & pertinent test results, reviewed documented beta blocker date and time   Airway Mallampati: II TM Distance: >3 FB Neck ROM: full    Dental No notable dental hx.    Pulmonary  breath sounds clear to auscultation  Pulmonary exam normal       Cardiovascular Rhythm:regular Rate:Normal     Neuro/Psych    GI/Hepatic GERD-  Medicated,  Endo/Other    Renal/GU      Musculoskeletal   Abdominal   Peds  Hematology   Anesthesia Other Findings Stocking glove neuropathy; no pain  Reproductive/Obstetrics                           Anesthesia Physical Anesthesia Plan  ASA: II  Anesthesia Plan: General   Post-op Pain Management:    Induction: Intravenous  Airway Management Planned: Oral ETT  Additional Equipment:   Intra-op Plan:   Post-operative Plan:   Informed Consent: I have reviewed the patients History and Physical, chart, labs and discussed the procedure including the risks, benefits and alternatives for the proposed anesthesia with the patient or authorized representative who has indicated his/her understanding and acceptance.   Dental Advisory Given and Dental advisory given  Plan Discussed with: CRNA and Surgeon  Anesthesia Plan Comments: (  Discussed  general anesthesia, including possible nausea, instrumentation of airway, sore throat,pulmonary aspiration, etc. I asked if the were any outstanding questions, or  concerns before we proceeded. )        Anesthesia Quick Evaluation

## 2011-12-21 NOTE — Op Note (Signed)
Angela Hart, Angela Hart              ACCOUNT NO.:  [id number]  MEDICAL RECORD NO.:  [id number]  LOCATION:  [facility]                          FACILITY:  [facility]  PHYSICIAN:  Lenoard Aden, M.D.DATE OF BIRTH:  Jun 22, 1950  DATE OF PROCEDURE:  12/21/2011 DATE OF DISCHARGE:                              OPERATIVE REPORT   DESCRIPTION OF PROCEDURE:  After being apprised of risks of anesthesia, infection, bleeding, injury to abdominal organs, need for repair, delayed versus immediate complications to include bowel and bladder injury, possible need for repair, the patient was brought to the operating room where she was administered general anesthetic without complications.  Prepped and draped in usual sterile fashion.  Foley catheter placed.  RUMI retractor placed per vagina without difficulty. At this time, after noting a midline incision from her previous colon resection and discussion with Dr. Abbey Chatters, decision was made to go in the subcostal margin in the right upper quadrant in the area of previous incision.  A small stab incision was made.  Veress needle placed, opening pressure -2, 4.5 L, CO2 insufflated without difficulty. Operative port was used for placement of 5 mm trocar, which reveals atraumatic abdominal entry.  Upon entry there are adhesions of the sigmoid colon to the left anterior abdominal wall in the left lower quadrant only which I felt to be easily resectable.  At this time, a 8 mm site is made on the right and an umbilical incision was made for placement of a 12 mm and 8-mm trocars respectively.  At this time, Endoshears were used to excise and release these adhesions of the sigmoid colon to the anterior abdominal wall without difficulty.  Good hemostasis was noted and an additional 8 mm trocar was then placed in the left lower quadrant under direct visualization.  The robot was then docked.  Endoshears and PK forceps were placed.  The uterus was then manipulated using the  RUMI retractor.  The retroperitoneal space was entered on the left side.  The ureter was identified.  The infundibulopelvic ligament was skeletonized, cauterized, and divided. The round ligament was then cauterized and divided.  The retroperitoneal space was further developed, mobilizing the ureter off the medial leaf of the peritoneum in a more caudad fashion.  At this time, the bladder flap was sharply scored and developed and RUMI cup was identified anteriorly.  The uterine vessels were skeletonized on the left, cauterized, and divided on the right side.  The peritoneum was opened. The infundibulopelvic ligament was skeletonized, divided, cauterized, and the ureter was identified.  The retroperitoneal space was further developed, mobilizing the ureter off the medial leaf of the peritoneum. The round ligament was cauterized and divided.  The bladder flap was further developed and then sharply dissected at the cervical vaginal junction at the level of the RUMI cup.  At this time, the uterine vessels were cauterized and divided on the right, divided on the left, and the RUMI cup was then circumscribed 360 degrees exposing the RUMI cup circumferentially.  The specimen was retracted into the vagina.  The incision vaginally was cauterized creating good hemostasis, closed using a 0 V-Loc suture in a continuous running fashion and the [employer] culdoplasty suture  was placed.  At this time, good hemostasis was noted. Ureters were noted to be peristalsing normally bilaterally.  Irrigation was accomplished.  All instruments were removed under direct visualization.  The trocars were removed.  Positive pressure was applied.  Revisualization reveals good hemostasis from all pedicles. Urine was clear and copious.  All instruments were then removed.  CO2 having been released.  Incisions were closed using 0 Vicryl, 4-0 Vicryl, and Dermabond.  Vaginal exam reveals the vaginal cuff to be well approximated  with no evidence of dehiscence. The patient tolerated the procedure well, was awakened and transferred to recovery in good condition.     Lenoard Aden, M.D.     RJT/MEDQ  D:  12/21/2011  T:  12/21/2011  Job:  [id number]

## 2011-12-22 ENCOUNTER — Inpatient Hospital Stay (HOSPITAL_COMMUNITY)
Admission: AD | Admit: 2011-12-22 | Discharge: 2011-12-22 | Disposition: A | Discharge status: Home / Self Care | Payer: BC Managed Care – PPO | Source: Ambulatory Visit | Attending: Obstetrics and Gynecology | Admitting: Obstetrics and Gynecology

## 2011-12-22 ENCOUNTER — Encounter (HOSPITAL_COMMUNITY): Payer: Self-pay

## 2011-12-22 DIAGNOSIS — IMO0002 Reserved for concepts with insufficient information to code with codable children: Secondary | ICD-10-CM | POA: Insufficient documentation

## 2011-12-22 DIAGNOSIS — Z9079 Acquired absence of other genital organ(s): Secondary | ICD-10-CM

## 2011-12-22 DIAGNOSIS — Z90710 Acquired absence of both cervix and uterus: Secondary | ICD-10-CM

## 2011-12-22 DIAGNOSIS — Z1509 Genetic susceptibility to other malignant neoplasm: Secondary | ICD-10-CM

## 2011-12-22 DIAGNOSIS — Z90722 Acquired absence of ovaries, bilateral: Secondary | ICD-10-CM

## 2011-12-22 HISTORY — DX: Genetic susceptibility to other malignant neoplasm: Z15.09

## 2011-12-22 LAB — COMPREHENSIVE METABOLIC PANEL
Albumin: 3.1 g/dL — ABNORMAL LOW (ref 3.5–5.2)
Alkaline Phosphatase: 67 U/L (ref 39–117)
BUN: 8 mg/dL (ref 6–23)
Chloride: 101 mEq/L (ref 96–112)
Creatinine, Ser: 0.67 mg/dL (ref 0.50–1.10)
GFR calc Af Amer: >90 mL/min (ref >90)
Glucose, Bld: 88 mg/dL (ref 70–99)
Potassium: 3.7 mEq/L (ref 3.5–5.1)
Total Bilirubin: 0.3 mg/dL (ref 0.3–1.2)
Total Protein: 5.4 g/dL — ABNORMAL LOW (ref 6.0–8.3)

## 2011-12-22 LAB — CBC
HCT: 32.6 % — ABNORMAL LOW (ref 36.0–46.0)
Hemoglobin: 10.9 g/dL — ABNORMAL LOW (ref 12.0–15.0)
MCHC: 33.4 g/dL (ref 30.0–36.0)
MCV: 95.9 fL (ref 78.0–100.0)
RDW: 14 % (ref 11.5–15.5)

## 2011-12-22 MED ORDER — PNEUMOCOCCAL VAC POLYVALENT 25 MCG/0.5ML IJ INJ
0.5000 mL | INJECTION | Freq: Once | INTRAMUSCULAR | Status: AC
  Administered 2011-12-22: 0.5 mL via INTRAMUSCULAR
  Filled 2011-12-22: qty 0.5

## 2011-12-22 MED ORDER — INFLUENZA VIRUS VACC SPLIT PF IM SUSP
0.5000 mL | Freq: Once | INTRAMUSCULAR | Status: AC
Start: 2011-12-22 — End: 2011-12-22
  Administered 2011-12-22: 0.5 mL via INTRAMUSCULAR
  Filled 2011-12-22: qty 0.5

## 2011-12-22 MED ORDER — TRAMADOL HCL 50 MG PO TABS: 50.0000 mg | ORAL_TABLET | Freq: Four times a day (QID) | ORAL | Status: DC | PRN

## 2011-12-22 MED ORDER — OXYCODONE-ACETAMINOPHEN 5-325 MG PO TABS: 1.0000 | ORAL_TABLET | ORAL | Status: DC | PRN

## 2011-12-22 NOTE — Progress Notes (Signed)
Dr. Cristela Blue evaluated pt for 2 acorn-sized lumps in her cheeks that developed while pt was eating dinner.  Dr. Jean Rosenthal advised pt to stay hydrated and to be monitored for signs of infection.  The 2 lumps were not present at the 2130 assessment.

## 2011-12-22 NOTE — MAU Note (Signed)
Pt had robotic hysterectomy Friday. Now having redness around navel incision. Hx postop wound infection.

## 2011-12-22 NOTE — MAU Provider Note (Signed)
History   Angela Hart is a 61 y.o. female s/p TLH/BSO, POD 1, discharged home this am from hospital. C/O redness around incision sites especially around umbilical site. Hx post-op wound infection with past abdominal sx, and patient nervous about infection reoccurring.  Pain well controlled with PO meds, no drainage from incision sites. Denies fever / chills / N/V, flatus present, voiding well.   Chief Complaint  Patient presents with  . Wound Check      Past Medical History  Diagnosis Date  . Depression     clinical  . Hyperlipidemia   . Fatigue   . Abdominal pain   . Anemia   . Blood transfusion 03/2010    at [facility]  . Neuropathy   . Cancer 03/2010    colon  . SVD (spontaneous vaginal delivery)     x 1  . Anxiety   . GERD (gastroesophageal reflux disease)     occasional  . 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 Heart murmur     hx -no problems  . Neuromuscular disorder     raynaud's disease hands/feet  . Arthritis     hands/hip, gout left ankle  . Lynch syndrome 12/22/2011  . S/P total hysterectomy and bilateral salpingo-oophorectomy (10/11) 12/22/2011    Past Surgical History  Procedure Date  . Nasal sinus surgery 1993  . Foot surgery     right  . Fatigue   . Colon surgery 04/12/2010  . Knee arthroscopy     right  . Ovarian cyst removal     Laparotomy  . Port-a-cath removal 05/03/2011  . Tooth extraction     molars  . Port a cath insertion 2012    for chemo use  . Colonscopy     x 5    Family History  Problem Relation Age of Onset  . Heart disease Mother   . Heart disease Father   . Heart disease Brother     History  Substance Use Topics  . Smoking status: Never Smoker   . Smokeless tobacco: Never Used  . Alcohol Use: No    Allergies: No Known Allergies  Prescriptions prior to admission  Medication Sig Dispense Refill  . acidophilus (RISAQUAD) CAPS Take 1 capsule by mouth daily.      . Ascorbic Acid  (VITAMIN C) 1000 MG tablet 2 tablets daily      . ASTAXANTHIN PO Take 1 tablet by mouth daily.        . B Complex-C-Folic Acid (MULTIVITAMIN, STRESS FORMULA) tablet Take 1 tablet by mouth daily.        . budesonide-formoterol (SYMBICORT) 80-4.5 MCG/ACT inhaler Inhale 2 puffs into the lungs daily.      Marland Kitchen buPROPion (WELLBUTRIN XL) 300 MG 24 hr tablet Take 300 mg by mouth daily.        . calcium carbonate (TUMS - DOSED IN MG ELEMENTAL CALCIUM) 500 MG chewable tablet Chew 1 tablet by mouth as needed.        . Calcium Carbonate-Vit D-Min (CALCIUM 1200 PO) Take 1,500 Units by mouth daily.        . cholecalciferol (VITAMIN D) 1000 UNITS tablet Take 2,000 Units by mouth daily.        Marland Kitchen escitalopram (LEXAPRO) 20 MG tablet Take 20 mg by mouth daily.        Marland Kitchen glucosamine-chondroitin 500-400 MG tablet Take 1 tablet by mouth 2 (two) times daily.  1500/1200 daily      . ibuprofen (ADVIL,MOTRIN) 200 MG tablet Take 200 mg by mouth at bedtime as needed.        . magnesium oxide (MAG-OX) 400 MG tablet Take 800 mg by mouth daily.      . Multiple Vitamin (MULTIVITAMIN WITH MINERALS) TABS Take 1 tablet by mouth daily.      Marland Kitchen oxyCODONE-acetaminophen (ROXICET) 5-325 MG per tablet Take 1-2 tablets by mouth every 4 (four) hours as needed for pain.  40 tablet  0  . ranitidine (ZANTAC) 75 MG tablet 2 at bedtime      . traMADol (ULTRAM) 50 MG tablet Take 1-2 tablets (50-100 mg total) by mouth every 6 (six) hours as needed.  30 tablet  0    ROS as noted above Physical Exam     Physical Exam: Blood pressure 115/48, pulse 74, temperature 98.2 F (36.8 C), resp. rate 20, height 5' 6.5" (1.689 m), weight 63.504 kg (140 lb). General: NAD Heart: RRR, no murmurs Lungs: CTA b/l  Abd: Soft, laparoscopic incision sites secured w/ dermabond, no drainage, mild tenderness at incision sites, mild erythema, no ecchymosis, no induration Ext: no edema Neuro: DTRs normal    ED Course    A/P: Stable post-op day 1, s/p Robotic  assisted TLH/BSO No evidence of infection. Pain well controlled   Reassurance given Patient discharged home to follow in office as scheduled in 2 weeks. Follow previous post-op instructions.  Arlan Organ , CNM 12/22/2011 8:49 PM

## 2011-12-22 NOTE — Progress Notes (Signed)
1 Day Post-Op Procedure(s) (LRB): ROBOTIC ASSISTED TOTAL HYSTERECTOMY WITH BILATERAL SALPINGO OOPHERECTOMY (Bilateral)  Subjective: Patient reports nausea, incisional pain, tolerating PO, + flatus and no problems voiding.    Objective: I have reviewed patient's vital signs, intake and output, medications and labs. BP 96/50  Pulse 66  Temp 98.3 F (36.8 C) (Oral)  Resp 16  Ht 5' 6.5" (1.689 m)  Wt 63.504 kg (140 lb)  BMI 22.26 kg/m2  SpO2 96%  CBC    Component Value Date/Time   WBC 7.7 12/22/2011 0534   WBC 8.8 04/26/2010 1237   RBC 3.40* 12/22/2011 0534   RBC 4.04 04/26/2010 1237   HGB 10.9* 12/22/2011 0534   HGB 10.0* 04/26/2010 1237   HCT 32.6* 12/22/2011 0534   HCT 31.5* 04/26/2010 1237   PLT 276 12/22/2011 0534   PLT 836* 04/26/2010 1237   MCV 95.9 12/22/2011 0534   MCV 77.9* 04/26/2010 1237   MCH 32.1 12/22/2011 0534   MCH 24.6* 04/26/2010 1237   MCHC 33.4 12/22/2011 0534   MCHC 31.6 04/26/2010 1237   RDW 14.0 12/22/2011 0534   RDW 21.5* 04/26/2010 1237   LYMPHSABS 1.1 04/26/2010 1237   LYMPHSABS 1.1 04/10/2010 1545   MONOABS 0.6 04/26/2010 1237   MONOABS 0.5 04/10/2010 1545   EOSABS 0.2 04/26/2010 1237   EOSABS 0.2 04/10/2010 1545   BASOSABS 0.1 04/26/2010 1237   BASOSABS 0.1 04/10/2010 1545    General: alert, cooperative and appears stated age Resp: clear to auscultation bilaterally and normal percussion bilaterally Cardio: regular rate and rhythm, S1, S2 normal, no murmur, click, rub or gallop, normal apical impulse and regular rate and rhythm GI: soft, non-tender; bowel sounds normal; no masses,  no organomegaly, normal findings: aorta normal, bowel sounds normal and liver span normal to percussion and incision: clean, dry and intact Extremities: extremities normal, atraumatic, no cyanosis or edema, Homans sign is negative, no sign of DVT and no edema, redness or tenderness in the calves or thighs Vaginal Bleeding: minimal  Assessment: s/p Procedure(s) (LRB) with  comments: ROBOTIC ASSISTED TOTAL HYSTERECTOMY WITH BILATERAL SALPINGO OOPHERECTOMY (Bilateral): stable, progressing well and tolerating diet  Plan: Stable post op Bilateral Parotiditis- improving DC home Fu office 2 weeks.  LOS: 1 day    Jory Tanguma J 12/22/2011, 8:05 AM

## 2011-12-22 NOTE — MAU Note (Signed)
Pt present day 1 s/p robotic hysterectomy.  Pt concerned with redness at umbilical incision due to previous surgical history of wound infection.

## 2012-01-06 IMAGING — CT CT ABD-PELV W/ CM
2 of 5 series · 17 of 46 positions shown, 19 images · IV contrast (agent unspecified)
Comparison: None.

CLINICAL DATA: Adenocarcinoma of the colon.  Iron deficiency
anemia.

CT ABDOMEN AND PELVIS WITH CONTRAST
TECHNIQUE: Multidetector CT imaging of the abdomen and pelvis was
performed following the standard protocol during bolus
administration of intravenous contrast.
Contrast: 100 ccs of Pmnipaque-HVV

[Series 2: rtn a/p with · axial · 0.73mm/px · z∈[-460,-110]mm · 14 of 78 slices shown, 16 images]
[im 4/78  soft-tissue]
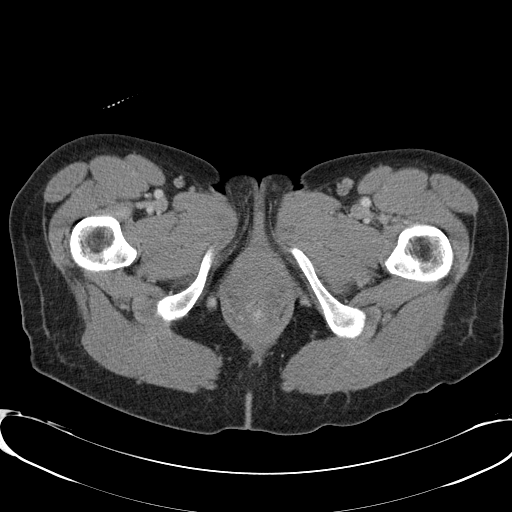
[im 4/78  bone]
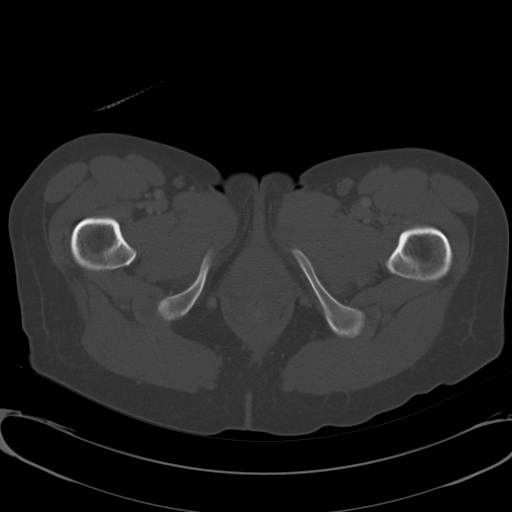
[im 12/78  soft-tissue]
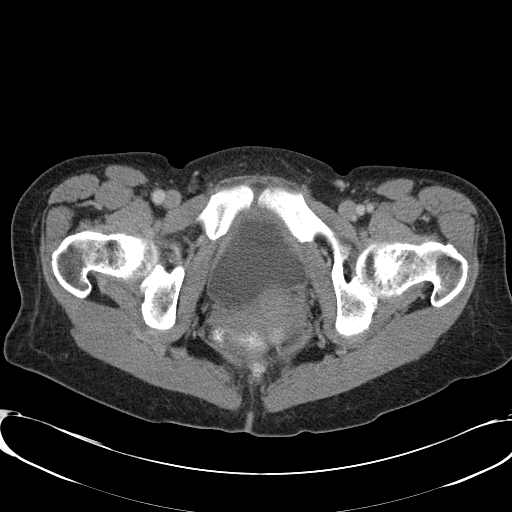
[im 16/78  soft-tissue]
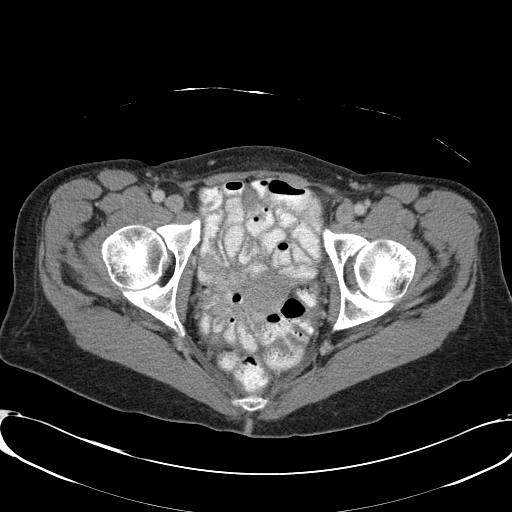
[im 20/78  soft-tissue]
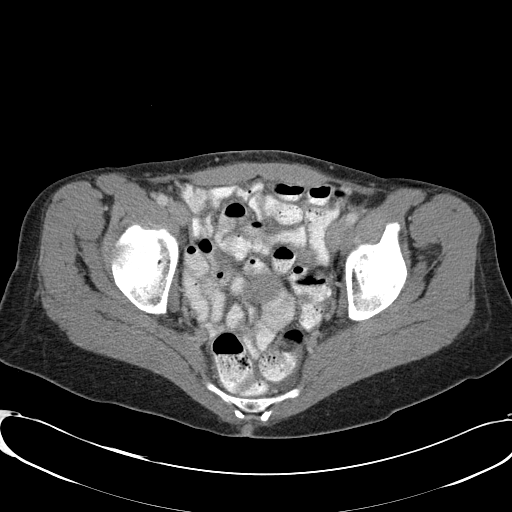
[im 27/78  soft-tissue]
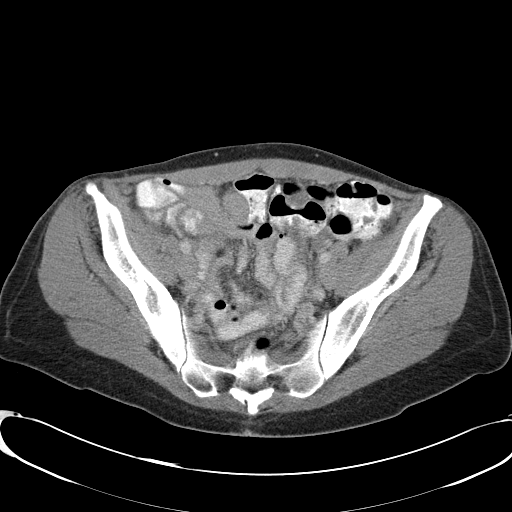
[im 31/78  soft-tissue]
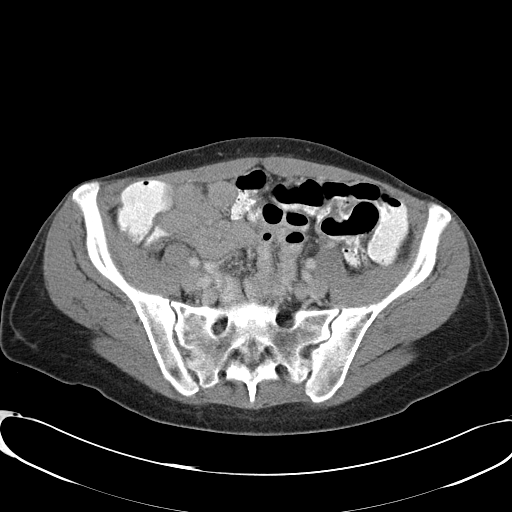
[im 35/78  soft-tissue]
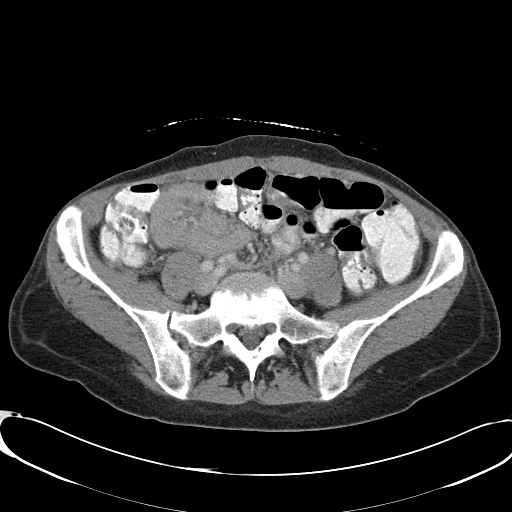
[im 43/78  soft-tissue]
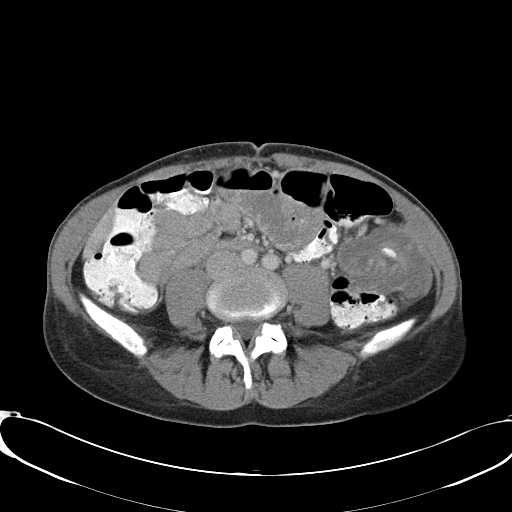
[im 47/78  soft-tissue]
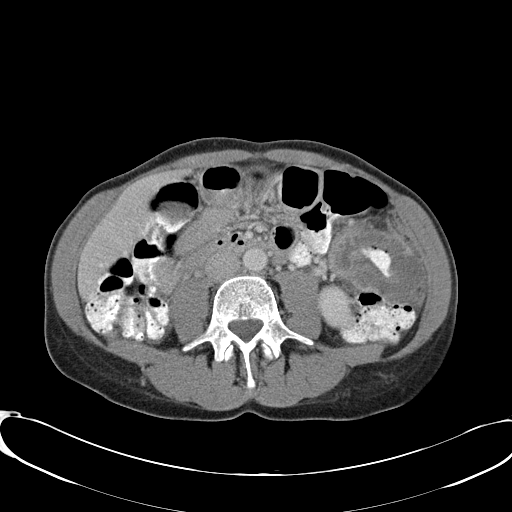
[im 47/78  bone]
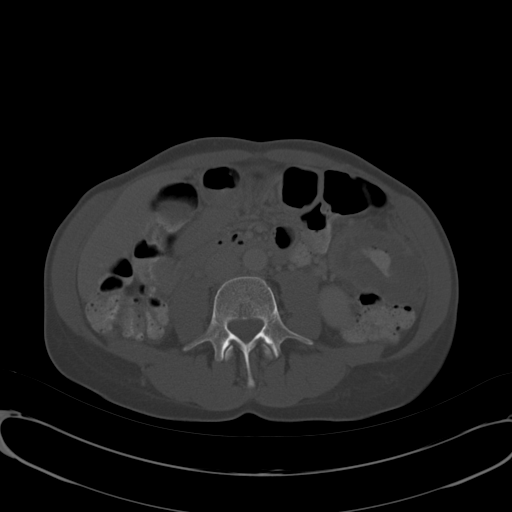
[im 51/78  soft-tissue]
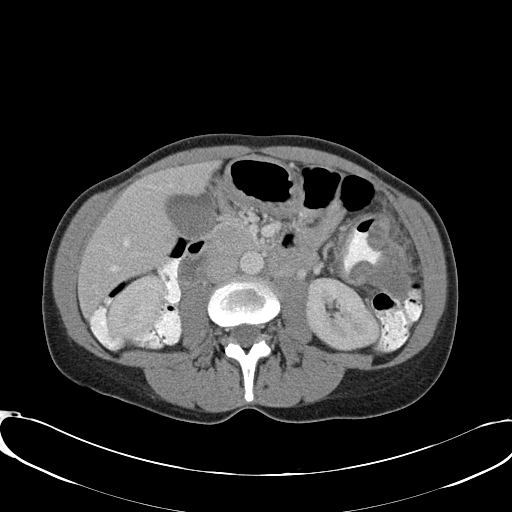
[im 58/78  soft-tissue]
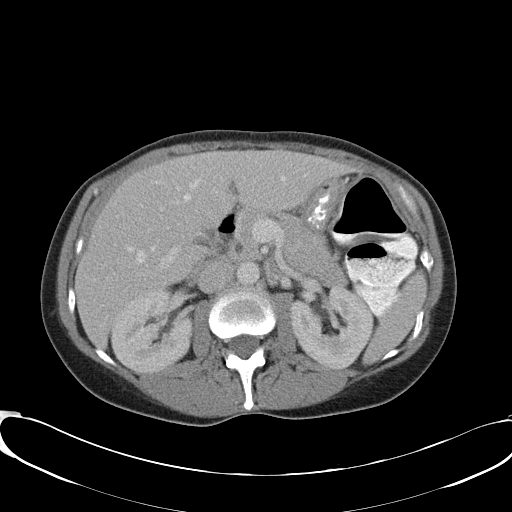
[im 62/78  soft-tissue]
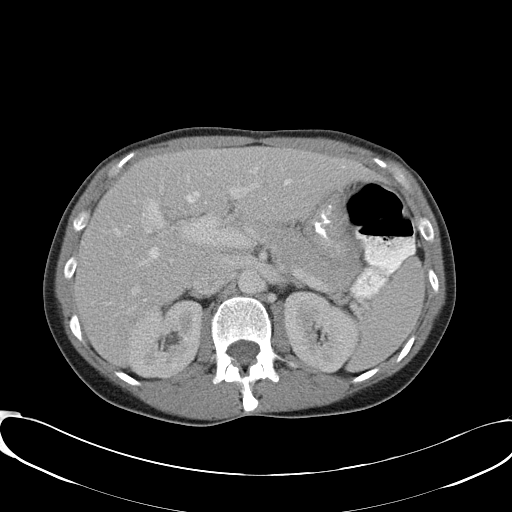
[im 66/78  soft-tissue]
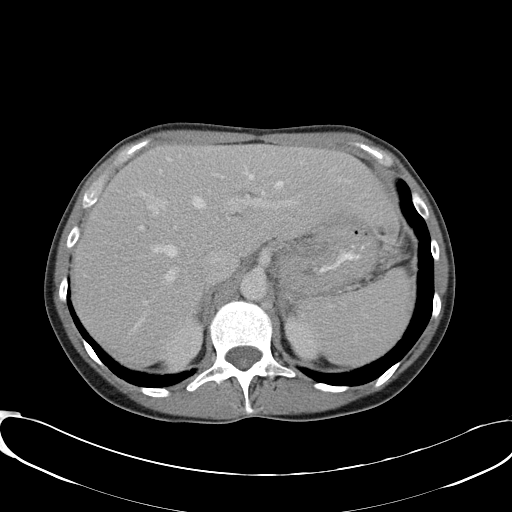
[im 74/78  soft-tissue]
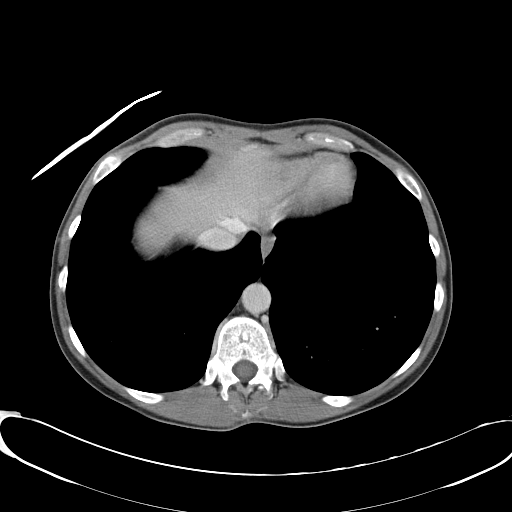

[Series 602: <mpr thick range> · coronal · 0.75mm/px · 3 of 69 slices shown]
[im 23/69  soft-tissue]
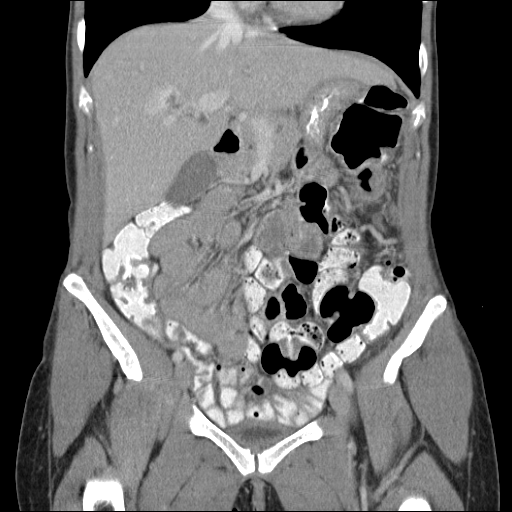
[im 31/69  soft-tissue]
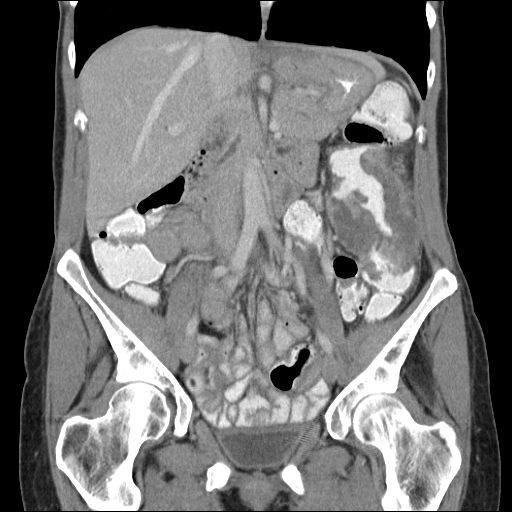
[im 38/69  soft-tissue]
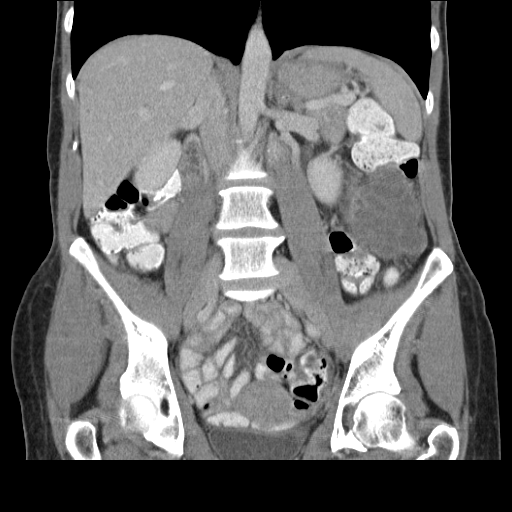

[17 of 46 positions shown; findings below may reference images not displayed]

FINDINGS: There is a 10.0 x 6.8 x 5.7 cm bulky circumferential
tumor of the distal transverse colon lying immediately anterior to
the descending colon.  The lumen of the colon is severely narrowed.

There is a 2.8 cm area of hyperdensity and enhancement in the
central portion of the right lobe of the liver.  The appearance is
not typical for metastatic lesion.  This may represent a small
hemangioma or a transient  hepatic attenuation difference.  There
are no other liver lesions.

The spleen, pancreas, adrenal glands, and kidneys are normal.
There is no adenopathy.  The uterus and ovaries are normal.

There is no osseous metastasis.  The lung bases are clear.
IMPRESSION: Large bulky circumferential tumor of the distal transverse colon
consistent with the adenocarcinoma seen at colonoscopy.

No definitive metastatic disease.

Enhancing lesion in the midportion of the right lobe of the liver
is most likely benign, probably a slightly atypical hemangioma or
transient attenuation difference.

## 2012-02-20 IMAGING — CR DG CHEST 1V PORT
1 series · 1 of 1 positions shown · non-contrast
Comparison: 04/10/2010

CLINICAL DATA: Post procedure.  Evaluate porta catheter

PORTABLE CHEST - 1 VIEW

[view not recorded]
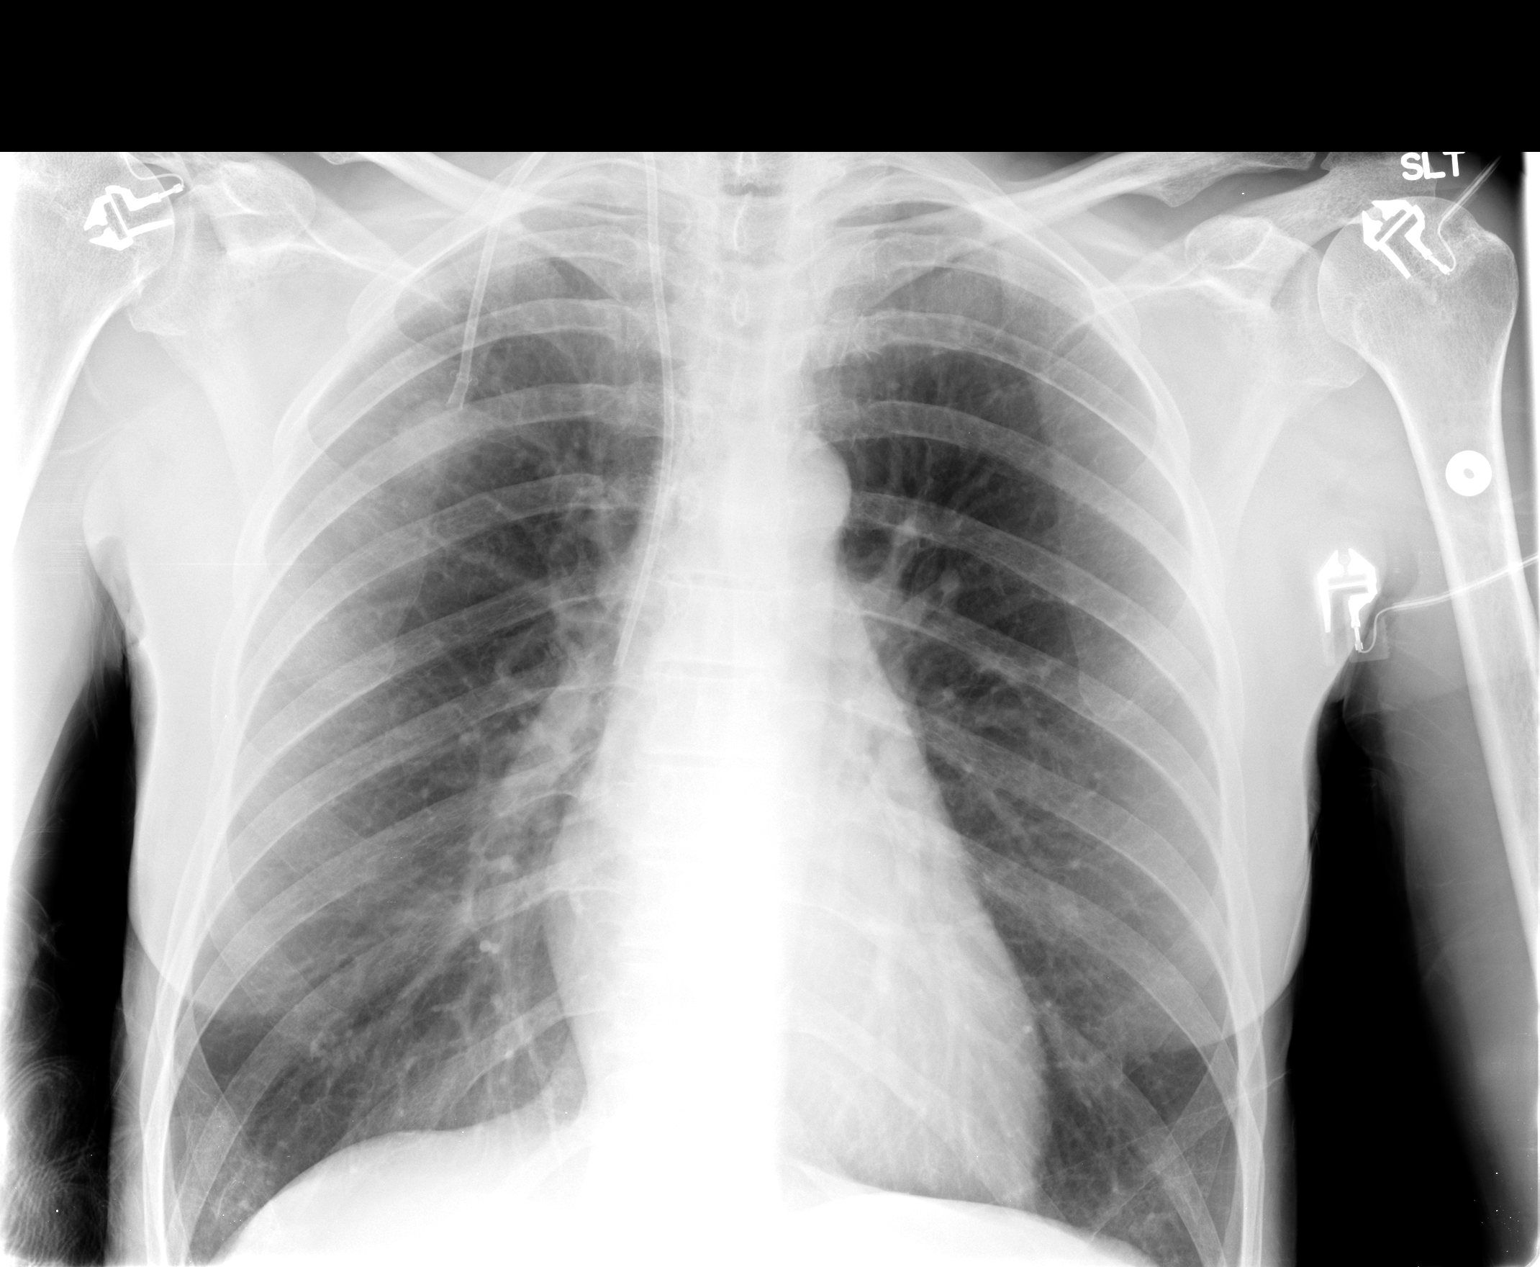

[1 of 1 positions shown; findings below may reference images not displayed]

FINDINGS: There is a right chest wall porta-catheter.  The tip of
the catheter is in the SVC.  No complicating features noted.

Heart size is normal.

There is no airspace consolidation identified.

No pleural effusion or pulmonary edema.
IMPRESSION: 1.  No active cardiopulmonary abnormalities.
2.  The right chest wall porta-catheter is in satisfactory
position.

## 2012-05-14 ENCOUNTER — Other Ambulatory Visit: Payer: Self-pay | Admitting: Gastroenterology

## 2012-05-30 ENCOUNTER — Encounter (HOSPITAL_COMMUNITY): Payer: Self-pay | Admitting: *Deleted

## 2012-06-03 ENCOUNTER — Encounter (HOSPITAL_COMMUNITY): Payer: Self-pay | Admitting: Pharmacy Technician

## 2012-06-10 ENCOUNTER — Encounter (HOSPITAL_COMMUNITY)
Admission: RE | Disposition: A | Discharge status: Home / Self Care | Payer: Self-pay | Source: Ambulatory Visit | Attending: Gastroenterology

## 2012-06-10 ENCOUNTER — Encounter (HOSPITAL_COMMUNITY): Payer: Self-pay | Admitting: Anesthesiology

## 2012-06-10 ENCOUNTER — Ambulatory Visit (HOSPITAL_COMMUNITY): Payer: BC Managed Care – PPO | Admitting: Anesthesiology

## 2012-06-10 ENCOUNTER — Ambulatory Visit (HOSPITAL_COMMUNITY)
Admission: RE | Admit: 2012-06-10 | Discharge: 2012-06-10 | Disposition: A | Discharge status: Home / Self Care | Payer: BC Managed Care – PPO | Source: Ambulatory Visit | Attending: Gastroenterology | Admitting: Gastroenterology

## 2012-06-10 ENCOUNTER — Encounter (HOSPITAL_COMMUNITY): Payer: Self-pay | Admitting: *Deleted

## 2012-06-10 DIAGNOSIS — Z9049 Acquired absence of other specified parts of digestive tract: Secondary | ICD-10-CM | POA: Insufficient documentation

## 2012-06-10 DIAGNOSIS — Z79899 Other long term (current) drug therapy: Secondary | ICD-10-CM | POA: Insufficient documentation

## 2012-06-10 DIAGNOSIS — Z90710 Acquired absence of both cervix and uterus: Secondary | ICD-10-CM | POA: Insufficient documentation

## 2012-06-10 DIAGNOSIS — Z1211 Encounter for screening for malignant neoplasm of colon: Secondary | ICD-10-CM | POA: Insufficient documentation

## 2012-06-10 DIAGNOSIS — F329 Major depressive disorder, single episode, unspecified: Secondary | ICD-10-CM | POA: Insufficient documentation

## 2012-06-10 DIAGNOSIS — I059 Rheumatic mitral valve disease, unspecified: Secondary | ICD-10-CM | POA: Insufficient documentation

## 2012-06-10 DIAGNOSIS — Z980 Intestinal bypass and anastomosis status: Secondary | ICD-10-CM | POA: Insufficient documentation

## 2012-06-10 DIAGNOSIS — Z85038 Personal history of other malignant neoplasm of large intestine: Secondary | ICD-10-CM | POA: Insufficient documentation

## 2012-06-10 DIAGNOSIS — F3289 Other specified depressive episodes: Secondary | ICD-10-CM | POA: Insufficient documentation

## 2012-06-10 DIAGNOSIS — I7300 Raynaud's syndrome without gangrene: Secondary | ICD-10-CM | POA: Insufficient documentation

## 2012-06-10 DIAGNOSIS — Z9079 Acquired absence of other genital organ(s): Secondary | ICD-10-CM | POA: Insufficient documentation

## 2012-06-10 DIAGNOSIS — Z1509 Genetic susceptibility to other malignant neoplasm: Secondary | ICD-10-CM | POA: Insufficient documentation

## 2012-06-10 DIAGNOSIS — L719 Rosacea, unspecified: Secondary | ICD-10-CM | POA: Insufficient documentation

## 2012-06-10 HISTORY — PX: COLONOSCOPY WITH PROPOFOL: SHX5780

## 2012-06-10 HISTORY — PX: ESOPHAGOGASTRODUODENOSCOPY (EGD) WITH PROPOFOL: SHX5813

## 2012-06-10 SURGERY — COLONOSCOPY WITH PROPOFOL
Anesthesia: Monitor Anesthesia Care

## 2012-06-10 MED ORDER — KETAMINE HCL 10 MG/ML IJ SOLN
INTRAMUSCULAR | Status: DC | PRN
  Administered 2012-06-10: 10 mg via INTRAVENOUS

## 2012-06-10 MED ORDER — MIDAZOLAM HCL 5 MG/5ML IJ SOLN
INTRAMUSCULAR | Status: DC | PRN
  Administered 2012-06-10: 1 mg via INTRAVENOUS

## 2012-06-10 MED ORDER — BUTAMBEN-TETRACAINE-BENZOCAINE 2-2-14 % EX AERO
INHALATION_SPRAY | CUTANEOUS | Status: DC | PRN
  Administered 2012-06-10: 1 via TOPICAL

## 2012-06-10 MED ORDER — LACTATED RINGERS IV SOLN
INTRAVENOUS | Status: DC | PRN
  Administered 2012-06-10: 12:00:00 via INTRAVENOUS

## 2012-06-10 MED ORDER — PROPOFOL 10 MG/ML IV EMUL
INTRAVENOUS | Status: DC | PRN
  Administered 2012-06-10: 70 ug/kg/min via INTRAVENOUS

## 2012-06-10 MED ORDER — SODIUM CHLORIDE 0.9 % IV SOLN: INTRAVENOUS | Status: DC

## 2012-06-10 MED ORDER — LACTATED RINGERS IV SOLN
INTRAVENOUS | Status: DC
  Administered 2012-06-10: 1000 mL via INTRAVENOUS

## 2012-06-10 MED ORDER — FENTANYL CITRATE 0.05 MG/ML IJ SOLN
INTRAMUSCULAR | Status: DC | PRN
  Administered 2012-06-10: 50 ug via INTRAVENOUS

## 2012-06-10 SURGICAL SUPPLY — 25 items

## 2012-06-10 NOTE — Op Note (Signed)
Procedure: Surveillance colonoscopy  Indication: 62 year old female with Lynch syndrome (PMS2) complicated by transverse colon cancer  Endoscopist: Danise Edge  Premedication: Propofol administered by anesthesia  Procedure: The patient was placed in the left lateral decubitus position. Anal inspection and digital rectal exam were normal. The Pentax pediatric colonoscope was introduced into the rectum and advanced to the cecum. A normal-appearing ileocecal valve and appendiceal orifice were identified. Unfortunately, the colon was poorly prepped for an accurate screen for colorectal neoplasia. With vigorous water irrigation of the colonic mucosa, I was able to visualize a good percentage of the colonic mucosa.  Rectum. Normal. Retroflex view of the distal rectum normal.  Sigmoid colon and descending colon. Normal.  Splenic flexure. Normal.  Transverse colon. I was able to identify the surgical anastomosis post transverse colon cancer surgery. The transverse colonic mucosa otherwise appeared normal.  Hepatic flexure. Normal.  Ascending colon. Normal.  Cecum and ileocecal valve. Normal.  Assessment: Normal surveillance proctocolonoscopy to the cecum post transverse colon cancer surgery although the colonic prep was poor.  Recommendations: Schedule repeat surveillance colonoscopy in one year.  Procedure: Screening esophagogastroduodenoscopy  Procedure: The patient was placed in the left lateral decubitus position. The Pentax gastroscope was passed through the posterior hypopharynx into the proximal esophagus without difficulty. The hypopharynx, larynx, and vocal cords appeared normal.  Esophagoscopy: The proximal, mid, and lower segments of the esophageal mucosa appear normal. The squamocolumnar junction is regular and noted at approximately 41 cm from the incisor teeth.  Gastroscopy: Retroflex view of the gastric cardia and fundus was normal. The gastric body, antrum, and pylorus  appeared normal.  Duodenoscopy: The duodenal bulb and descending duodenum appeared normal. I did not identify the major or minor papilla.  Assessment: Normal screening esophagogastroduodenoscopy.  Recommendations: Schedule repeat screening esophagogastroduodenoscopy in one year.

## 2012-06-10 NOTE — Anesthesia Preprocedure Evaluation (Signed)
Anesthesia Evaluation  Patient identified by MRN, date of birth, ID band Patient awake    Reviewed: Allergy & Precautions, H&P , Patient's Chart, lab work & pertinent test results, reviewed documented beta blocker date and time   Airway Mallampati: II TM Distance: >3 FB Neck ROM: full    Dental no notable dental hx.    Pulmonary  breath sounds clear to auscultation  Pulmonary exam normal       Cardiovascular Rhythm:regular Rate:Normal     Neuro/Psych    GI/Hepatic GERD-  Medicated,  Endo/Other    Renal/GU      Musculoskeletal   Abdominal   Peds  Hematology   Anesthesia Other Findings Stocking glove neuropathy; no pain  Reproductive/Obstetrics                           Anesthesia Physical  Anesthesia Plan  ASA: II  Anesthesia Plan: MAC   Post-op Pain Management:    Induction:   Airway Management Planned:   Additional Equipment:   Intra-op Plan:   Post-operative Plan:   Informed Consent: I have reviewed the patients History and Physical, chart, labs and discussed the procedure including the risks, benefits and alternatives for the proposed anesthesia with the patient or authorized representative who has indicated his/her understanding and acceptance.   Dental Advisory Given and Dental advisory given  Plan Discussed with: CRNA and Surgeon  Anesthesia Plan Comments: (   )        Anesthesia Quick Evaluation

## 2012-06-10 NOTE — Anesthesia Postprocedure Evaluation (Signed)
  Anesthesia Post-op Note  Patient: Angela Hart  Procedure(s) Performed: Procedure(s) (LRB): COLONOSCOPY WITH PROPOFOL (N/A) ESOPHAGOGASTRODUODENOSCOPY (EGD) WITH PROPOFOL (N/A)  Patient Location: PACU  Anesthesia Type: MAC  Level of Consciousness: awake and alert   Airway and Oxygen Therapy: Patient Spontanous Breathing  Post-op Pain: mild  Post-op Assessment: Post-op Vital signs reviewed, Patient's Cardiovascular Status Stable, Respiratory Function Stable, Patent Airway and No signs of Nausea or vomiting  Last Vitals:  Filed Vitals:   06/10/12 1339  BP: 121/77  Temp:   Resp: 13    Post-op Vital Signs: stable   Complications: No apparent anesthesia complications

## 2012-06-10 NOTE — Transfer of Care (Signed)
Immediate Anesthesia Transfer of Care Note  Patient: Angela Hart  Procedure(s) Performed: Procedure(s): COLONOSCOPY WITH PROPOFOL (N/A) ESOPHAGOGASTRODUODENOSCOPY (EGD) WITH PROPOFOL (N/A)  Patient Location: PACU  Anesthesia Type:MAC  Level of Consciousness: awake, alert , oriented and patient cooperative  Airway & Oxygen Therapy: Patient Spontanous Breathing and Patient connected to nasal cannula oxygen  Post-op Assessment: Report given to PACU RN and Post -op Vital signs reviewed and stable  Post vital signs: stable  Complications: No apparent anesthesia complications

## 2012-06-10 NOTE — H&P (Signed)
Procedure: Surveillance colonoscopy. Lynch Syndrome (PMS2 mutation). T4No transverse colon cancer surgery.  History: The patient is a 62 year old female with Lynch syndrome (PMS2 mutation) complicated by colon cancer.  The patient underwent a surveillance colonoscopy at [facility] in November 2012.  The patient is scheduled to undergo a repeat surveillance colonoscopy and surveillance esophagogastroduodenoscopy.  Chronic medications: Lexapro. Wellbutrin. Advil. Multivitamin. Vitamin D. Vitamin C . Calcium. Glucosamine.  Past medical and surgical history: Lynch syndrome complicated by colon cancer. Concussion, pelvic fracture, and torn left ACL in 2008. Allergic rhinitis. Depression. Mitral valve prolapse. Rosacea. Raynaud's phenomenon. Right knee arthroscopy. Sinus surgery. Ovarian cystectomy. Morton's neuroma surgery. Varicose vein surgery. Transverse colon cancer (T4N0) surgery. Total abdominal hysterectomy with bilateral salpingo-oophorectomy.  Plan: Proceed with surveillance colonoscopy and esophagogastroduodenoscopy as scheduled.

## 2012-06-11 ENCOUNTER — Encounter (HOSPITAL_COMMUNITY): Payer: Self-pay | Admitting: Gastroenterology

## 2012-07-01 ENCOUNTER — Other Ambulatory Visit: Payer: Self-pay | Admitting: Physician Assistant

## 2013-07-13 ENCOUNTER — Other Ambulatory Visit: Payer: Self-pay | Admitting: Gastroenterology

## 2013-08-19 ENCOUNTER — Other Ambulatory Visit: Payer: Self-pay | Admitting: Podiatry

## 2013-09-03 ENCOUNTER — Encounter (HOSPITAL_COMMUNITY): Payer: Self-pay | Admitting: Pharmacy Technician

## 2013-09-14 ENCOUNTER — Encounter (HOSPITAL_COMMUNITY): Payer: Self-pay | Admitting: *Deleted

## 2013-09-22 ENCOUNTER — Ambulatory Visit (HOSPITAL_COMMUNITY)
Admission: RE | Admit: 2013-09-22 | Discharge: 2013-09-22 | Disposition: A | Discharge status: Home / Self Care | Payer: BC Managed Care – PPO | Source: Ambulatory Visit | Attending: Gastroenterology | Admitting: Gastroenterology

## 2013-09-22 ENCOUNTER — Ambulatory Visit (HOSPITAL_COMMUNITY): Payer: BC Managed Care – PPO | Admitting: Anesthesiology

## 2013-09-22 ENCOUNTER — Encounter (HOSPITAL_COMMUNITY)
Admission: RE | Disposition: A | Discharge status: Home / Self Care | Payer: Self-pay | Source: Ambulatory Visit | Attending: Gastroenterology

## 2013-09-22 ENCOUNTER — Encounter (HOSPITAL_COMMUNITY): Payer: BC Managed Care – PPO | Admitting: Anesthesiology

## 2013-09-22 DIAGNOSIS — D126 Benign neoplasm of colon, unspecified: Secondary | ICD-10-CM | POA: Diagnosis not present

## 2013-09-22 DIAGNOSIS — Z1211 Encounter for screening for malignant neoplasm of colon: Secondary | ICD-10-CM | POA: Insufficient documentation

## 2013-09-22 DIAGNOSIS — J45909 Unspecified asthma, uncomplicated: Secondary | ICD-10-CM | POA: Insufficient documentation

## 2013-09-22 DIAGNOSIS — Z85038 Personal history of other malignant neoplasm of large intestine: Secondary | ICD-10-CM | POA: Diagnosis not present

## 2013-09-22 DIAGNOSIS — K219 Gastro-esophageal reflux disease without esophagitis: Secondary | ICD-10-CM | POA: Insufficient documentation

## 2013-09-22 HISTORY — PX: COLONOSCOPY WITH PROPOFOL: SHX5780

## 2013-09-22 HISTORY — PX: ESOPHAGOGASTRODUODENOSCOPY (EGD) WITH PROPOFOL: SHX5813

## 2013-09-22 HISTORY — DX: Raynaud's syndrome without gangrene: I73.00

## 2013-09-22 SURGERY — COLONOSCOPY WITH PROPOFOL
Anesthesia: Monitor Anesthesia Care

## 2013-09-22 MED ORDER — LIDOCAINE HCL (CARDIAC) 20 MG/ML IV SOLN
INTRAVENOUS | Status: DC | PRN
  Administered 2013-09-22: 100 mg via INTRAVENOUS

## 2013-09-22 MED ORDER — PROPOFOL INFUSION 10 MG/ML OPTIME
INTRAVENOUS | Status: DC | PRN
  Administered 2013-09-22: 100 ug/kg/min via INTRAVENOUS

## 2013-09-22 MED ORDER — BUTAMBEN-TETRACAINE-BENZOCAINE 2-2-14 % EX AERO
INHALATION_SPRAY | CUTANEOUS | Status: DC | PRN
  Administered 2013-09-22: 2 via TOPICAL

## 2013-09-22 MED ORDER — FENTANYL CITRATE 0.05 MG/ML IJ SOLN
25.0000 ug | INTRAMUSCULAR | Status: DC | PRN
Start: 2013-09-22 — End: 2013-09-22

## 2013-09-22 MED ORDER — PROPOFOL 10 MG/ML IV BOLUS
INTRAVENOUS | Status: AC
  Filled 2013-09-22: qty 20

## 2013-09-22 MED ORDER — PROPOFOL 10 MG/ML IV BOLUS
INTRAVENOUS | Status: DC | PRN
  Administered 2013-09-22: 10 mg via INTRAVENOUS
  Administered 2013-09-22 (×2): 20 mg via INTRAVENOUS

## 2013-09-22 MED ORDER — LIDOCAINE HCL (CARDIAC) 20 MG/ML IV SOLN
INTRAVENOUS | Status: AC
  Filled 2013-09-22: qty 5

## 2013-09-22 MED ORDER — SODIUM CHLORIDE 0.9 % IV SOLN: INTRAVENOUS | Status: DC

## 2013-09-22 MED ORDER — LACTATED RINGERS IV SOLN
INTRAVENOUS | Status: DC
  Administered 2013-09-22: 1000 mL via INTRAVENOUS

## 2013-09-22 SURGICAL SUPPLY — 25 items

## 2013-09-22 NOTE — Op Note (Signed)
Procedure: Surveillance esophagogastroduodenoscopy and colonoscopy. PMS2 Lynch syndrome complicated by E0E2 adenocarcinoma of the transverse colon. Visicol tablet colon prep.  Endoscopist: Earle Gell  Premedication: Propofol administered by anesthesia  Procedure: Surveillance esophagogastroduodenoscopy The patient was placed in the left lateral decubitus position. The Pentax gastroscope was passed through the posterior hypopharynx into the proximal esophagus without difficulty. The hypopharynx, larynx, and vocal cords appeared normal.  Esophagoscopy: The proximal, mid, and lower segments of the esophageal mucosa appeared normal. The squamocolumnar junction was regular and noted at 40 cm from the incisor teeth  Gastroscopy: Retroflex view of the gastric cardia and fundus was normal. The gastric body, antrum, and pylorus appeared normal.  Duodenoscopy: The duodenal bulb and descending duodenum appeared normal.  Assessment: Normal esophagogastroduodenoscopy  Procedure: Surveillance colonoscopy Anal inspection and digital rectal exam were normal. The Pentax pediatric colonoscope was introduced into the rectum and advanced to the cecum. A normal-appearing appendiceal orifice and ileocecal valve were identified. Colonic preparation for the exam today was good.  Rectum. Normal. Retroflexed view of the distal rectum normal  Sigmoid colon and descending colon. Normal  Splenic flexure. Normal  Transverse colon. Normal. The surgical anastomosis was identified and appeared normal.  Hepatic flexure. From the hepatic flexure a 5 mm sessile polyp was removed with the cold snare and submitted for pathological interpretation  Ascending colon. Normal  Cecum and ileocecal valve. Normal  Assessment: From the hepatic flexure, a 5 mm sessile polyp was removed with the cold snare.  Recommendation: Schedule repeat surveillance colonoscopy in one year.

## 2013-09-22 NOTE — Transfer of Care (Signed)
Immediate Anesthesia Transfer of Care Note  Patient: Angela Hart  Procedure(s) Performed: Procedure(s): COLONOSCOPY WITH PROPOFOL (N/A) ESOPHAGOGASTRODUODENOSCOPY (EGD) WITH PROPOFOL (N/A)  Patient Location: PACU and Endoscopy Unit  Anesthesia Type:MAC  Level of Consciousness: awake, alert  and oriented  Airway & Oxygen Therapy: Patient Spontanous Breathing and Patient connected to nasal cannula oxygen  Post-op Assessment: Report given to PACU RN, Post -op Vital signs reviewed and stable and Patient moving all extremities X 4  Post vital signs: Reviewed and stable  Complications: No apparent anesthesia complications

## 2013-09-22 NOTE — Anesthesia Postprocedure Evaluation (Signed)
  Anesthesia Post-op Note  Patient: Angela Hart  Procedure(s) Performed: Procedure(s) (LRB): COLONOSCOPY WITH PROPOFOL (N/A) ESOPHAGOGASTRODUODENOSCOPY (EGD) WITH PROPOFOL (N/A)  Patient Location: PACU  Anesthesia Type: MAC  Level of Consciousness: awake and alert   Airway and Oxygen Therapy: Patient Spontanous Breathing  Post-op Pain: mild  Post-op Assessment: Post-op Vital signs reviewed, Patient's Cardiovascular Status Stable, Respiratory Function Stable, Patent Airway and No signs of Nausea or vomiting  Last Vitals:  Filed Vitals:   09/22/13 1044  BP: 109/59  Pulse:   Temp: 36.3 C  Resp:     Post-op Vital Signs: stable   Complications: No apparent anesthesia complications

## 2013-09-22 NOTE — H&P (Signed)
  Procedure: Surveillance colonoscopy and esophagogastroduodenoscopy. PMS2 Lynch syndrome. T4N0 adenocarcinoma of the transverse colon surgery  History: The patient is a 63 year old female born 10-24-1950. She is scheduled to undergo a surveillance colonoscopy and esophagogastroduodenoscopy.  Past medical history: PMS2 Lynch syndrome complicated by X6D2 adenocarcinoma of the transverse colon. Allergic rhinitis. Mitral valve prolapse syndrome. Depression. Raynaud's phenomenon. Right knee arthroscopic surgery. Sinus surgery. Ovarian cystectomy. Morton's neuroma. There is pain surgery.  Exam: The patient is alert and lying comfortably on the endoscopy stretcher. Abdomen is soft and nontender to palpation. Lungs are clear to auscultation. Cardiac exam reveals a regular rhythm.  Plan: Proceed with surveillance esophagogastroduodenoscopy and colonoscopy.

## 2013-09-22 NOTE — Anesthesia Preprocedure Evaluation (Addendum)
Anesthesia Evaluation  Patient identified by MRN, date of birth, ID band Patient awake    Reviewed: Allergy & Precautions, H&P , NPO status , Patient's Chart, lab work & pertinent test results  Airway Mallampati: II TM Distance: >3 FB Neck ROM: full    Dental no notable dental hx. (+) Caps, Dental Advisory Given, Loose Veneers on all front teeth.  Loose crown rear molar.  Risk of aspiration discussed:   Pulmonary asthma ,  Lungs damaged by chemo breath sounds clear to auscultation  Pulmonary exam normal       Cardiovascular Exercise Tolerance: Good negative cardio ROS  Rhythm:regular Rate:Normal     Neuro/Psych Raynaud's negative neurological ROS  negative psych ROS   GI/Hepatic Neg liver ROS, GERD-  Medicated and Controlled,Colon cancer   Endo/Other  negative endocrine ROS  Renal/GU negative Renal ROS  negative genitourinary   Musculoskeletal   Abdominal   Peds  Hematology negative hematology ROS (+)   Anesthesia Other Findings   Reproductive/Obstetrics negative OB ROS                          Anesthesia Physical Anesthesia Plan  ASA: III  Anesthesia Plan: MAC   Post-op Pain Management:    Induction:   Airway Management Planned:   Additional Equipment:   Intra-op Plan:   Post-operative Plan:   Informed Consent: I have reviewed the patients History and Physical, chart, labs and discussed the procedure including the risks, benefits and alternatives for the proposed anesthesia with the patient or authorized representative who has indicated his/her understanding and acceptance.   Dental Advisory Given  Plan Discussed with: CRNA and Surgeon  Anesthesia Plan Comments:         Anesthesia Quick Evaluation

## 2013-09-23 ENCOUNTER — Encounter (HOSPITAL_COMMUNITY): Payer: Self-pay | Admitting: Gastroenterology

## 2014-07-15 ENCOUNTER — Other Ambulatory Visit (HOSPITAL_COMMUNITY): Payer: Self-pay | Admitting: Physician Assistant

## 2014-09-21 ENCOUNTER — Other Ambulatory Visit: Payer: Self-pay | Admitting: Physician Assistant

## 2014-10-27 ENCOUNTER — Other Ambulatory Visit: Payer: Self-pay | Admitting: Physician Assistant

## 2014-10-27 DIAGNOSIS — C4492 Squamous cell carcinoma of skin, unspecified: Secondary | ICD-10-CM

## 2014-10-27 HISTORY — DX: Squamous cell carcinoma of skin, unspecified: C44.92

## 2014-12-08 ENCOUNTER — Other Ambulatory Visit: Payer: Self-pay | Admitting: Physician Assistant

## 2015-06-22 ENCOUNTER — Other Ambulatory Visit: Payer: Self-pay | Admitting: Gastroenterology

## 2015-07-26 ENCOUNTER — Encounter (HOSPITAL_COMMUNITY): Payer: Self-pay | Admitting: *Deleted

## 2015-08-01 ENCOUNTER — Encounter (HOSPITAL_COMMUNITY): Payer: Self-pay | Admitting: Anesthesiology

## 2015-08-01 NOTE — Anesthesia Preprocedure Evaluation (Addendum)
Anesthesia Evaluation  Patient identified by MRN, date of birth, ID band Patient awake    Reviewed: Allergy & Precautions, NPO status , Patient's Chart, lab work & pertinent test results  Airway Mallampati: II  TM Distance: >3 FB Neck ROM: Full    Dental no notable dental hx.    Pulmonary asthma ,    Pulmonary exam normal breath sounds clear to auscultation       Cardiovascular + Peripheral Vascular Disease  Normal cardiovascular exam+ Valvular Problems/Murmurs  Rhythm:Regular Rate:Normal     Neuro/Psych PSYCHIATRIC DISORDERS Anxiety Depression  Neuromuscular disease    GI/Hepatic Neg liver ROS, GERD  Medicated,  Endo/Other  negative endocrine ROS  Renal/GU negative Renal ROS  negative genitourinary   Musculoskeletal  (+) Arthritis ,   Abdominal   Peds negative pediatric ROS (+)  Hematology  (+) anemia ,   Anesthesia Other Findings   Reproductive/Obstetrics negative OB ROS                             Anesthesia Physical Anesthesia Plan  ASA: II  Anesthesia Plan: MAC   Post-op Pain Management:    Induction: Intravenous  Airway Management Planned: Natural Airway  Additional Equipment:   Intra-op Plan:   Post-operative Plan:   Informed Consent: I have reviewed the patients History and Physical, chart, labs and discussed the procedure including the risks, benefits and alternatives for the proposed anesthesia with the patient or authorized representative who has indicated his/her understanding and acceptance.   Dental advisory given  Plan Discussed with: CRNA  Anesthesia Plan Comments:         Anesthesia Quick Evaluation

## 2015-08-02 ENCOUNTER — Ambulatory Visit (HOSPITAL_COMMUNITY)
Admission: RE | Admit: 2015-08-02 | Discharge: 2015-08-02 | Disposition: A | Discharge status: Home / Self Care | Payer: Managed Care, Other (non HMO) | Source: Ambulatory Visit | Attending: Gastroenterology | Admitting: Gastroenterology

## 2015-08-02 ENCOUNTER — Ambulatory Visit (HOSPITAL_COMMUNITY): Payer: Managed Care, Other (non HMO) | Admitting: Anesthesiology

## 2015-08-02 ENCOUNTER — Encounter (HOSPITAL_COMMUNITY)
Admission: RE | Disposition: A | Discharge status: Home / Self Care | Payer: Self-pay | Source: Ambulatory Visit | Attending: Gastroenterology

## 2015-08-02 ENCOUNTER — Encounter (HOSPITAL_COMMUNITY): Payer: Self-pay | Admitting: *Deleted

## 2015-08-02 DIAGNOSIS — K635 Polyp of colon: Secondary | ICD-10-CM | POA: Diagnosis not present

## 2015-08-02 DIAGNOSIS — Z1211 Encounter for screening for malignant neoplasm of colon: Secondary | ICD-10-CM | POA: Insufficient documentation

## 2015-08-02 DIAGNOSIS — G4733 Obstructive sleep apnea (adult) (pediatric): Secondary | ICD-10-CM | POA: Diagnosis not present

## 2015-08-02 DIAGNOSIS — M1990 Unspecified osteoarthritis, unspecified site: Secondary | ICD-10-CM | POA: Diagnosis not present

## 2015-08-02 DIAGNOSIS — Z85038 Personal history of other malignant neoplasm of large intestine: Secondary | ICD-10-CM | POA: Insufficient documentation

## 2015-08-02 DIAGNOSIS — K219 Gastro-esophageal reflux disease without esophagitis: Secondary | ICD-10-CM | POA: Insufficient documentation

## 2015-08-02 DIAGNOSIS — D122 Benign neoplasm of ascending colon: Secondary | ICD-10-CM | POA: Insufficient documentation

## 2015-08-02 DIAGNOSIS — I739 Peripheral vascular disease, unspecified: Secondary | ICD-10-CM | POA: Diagnosis not present

## 2015-08-02 DIAGNOSIS — I341 Nonrheumatic mitral (valve) prolapse: Secondary | ICD-10-CM | POA: Insufficient documentation

## 2015-08-02 DIAGNOSIS — Z1509 Genetic susceptibility to other malignant neoplasm: Secondary | ICD-10-CM | POA: Diagnosis not present

## 2015-08-02 HISTORY — PX: COLONOSCOPY WITH PROPOFOL: SHX5780

## 2015-08-02 SURGERY — COLONOSCOPY WITH PROPOFOL
Anesthesia: Monitor Anesthesia Care

## 2015-08-02 MED ORDER — PROPOFOL 500 MG/50ML IV EMUL
INTRAVENOUS | Status: DC | PRN
  Administered 2015-08-02: 200 ug/kg/min via INTRAVENOUS

## 2015-08-02 MED ORDER — PROPOFOL 500 MG/50ML IV EMUL
INTRAVENOUS | Status: DC | PRN
  Administered 2015-08-02: 20 mg via INTRAVENOUS
  Administered 2015-08-02: 50 mg via INTRAVENOUS

## 2015-08-02 MED ORDER — LACTATED RINGERS IV SOLN
INTRAVENOUS | Status: DC
  Administered 2015-08-02 (×2): via INTRAVENOUS

## 2015-08-02 MED ORDER — PROPOFOL 10 MG/ML IV BOLUS
INTRAVENOUS | Status: AC
  Filled 2015-08-02: qty 20

## 2015-08-02 MED ORDER — PROPOFOL 10 MG/ML IV BOLUS
INTRAVENOUS | Status: AC
  Filled 2015-08-02: qty 40

## 2015-08-02 MED ORDER — SODIUM CHLORIDE 0.9 % IV SOLN: INTRAVENOUS | Status: DC

## 2015-08-02 SURGICAL SUPPLY — 22 items

## 2015-08-02 NOTE — Op Note (Signed)
[facility] Patient Name: Angela Hart Procedure Date: 08/02/2015 MRN: [id number] Attending MD: Garlan Fair , MD Date of Birth: November 13, 1950 CSN: [id number] Age: 65 Admit Type: Outpatient Procedure:                Colonoscopy Indications:              High risk colon cancer surveillance: Personal                            history of transverse colon cancer surgery. Lynch                            Syndrome (PMS2). Providers:                Garlan Fair, MD, Hilma Favors, RN, Zenon Mayo, RN, Cherylynn Ridges, Technician, Arnoldo Hooker, CRNA Referring MD:              Medicines:                Propofol per Anesthesia Complications:            No immediate complications. Estimated Blood Loss:     Estimated blood loss: none. Procedure:                Pre-Anesthesia Assessment:                           - Prior to the procedure, a History and Physical                            was performed, and patient medications and                            allergies were reviewed. The patient's tolerance of                            previous anesthesia was also reviewed. The risks                            and benefits of the procedure and the sedation                            options and risks were discussed with the patient.                            All questions were answered, and informed consent                            was obtained. Prior Anticoagulants: The patient has                            taken no previous anticoagulant or antiplatelet  agents. ASA Grade Assessment: II - A patient with                            mild systemic disease. After reviewing the risks                            and benefits, the patient was deemed in                            satisfactory condition to undergo the procedure.                           After obtaining informed consent, the colonoscope   was passed under direct vision. Throughout the                            procedure, the patient's blood pressure, pulse, and                            oxygen saturations were monitored continuously. The                            EC-[id number] ([id number]) scope was introduced through                            the anus and advanced to the the cecum, identified                            by appendiceal orifice and ileocecal valve. The                            colonoscopy was performed without difficulty. The                            patient tolerated the procedure well. The quality                            of the bowel preparation was unsatisfactory. The                            appendiceal orifice and the rectum were                            photographed. Scope In: 8:10:57 AM Scope Out: 8:51:59 AM Scope Withdrawal Time: 0 hours 35 minutes 58 seconds  Total Procedure Duration: 0 hours 41 minutes 2 seconds  Findings:      The perianal and digital rectal examinations were normal.      A 5 mm polyp was found in the proximal ascending colon. The polyp was       sessile. The polyp was removed with a cold snare. Resection and       retrieval were complete.      The exam was otherwise without abnormality. Impression:               - Preparation of the  colon was unsatisfactory.                           - One 5 mm polyp in the proximal ascending colon,                            removed with a cold snare. Resected and retrieved.                           - The examination was otherwise normal. Moderate Sedation:      N/A- Per Anesthesia Care      N/A- Per Anesthesia Care Recommendation:           - Patient has a contact number available for                            emergencies. The signs and symptoms of potential                            delayed complications were discussed with the                            patient. Return to normal activities tomorrow.                             Written discharge instructions were provided to the                            patient.                           - Repeat colonoscopy at the next available                            appointment for surveillance.                           - Resume previous diet.                           - Continue present medications. Procedure Code(s):        --- Professional ---                           [phone], Colonoscopy, flexible; with removal of                            tumor(s), polyp(s), or other lesion(s) by snare                            technique Diagnosis Code(s):        --- Professional ---                           S49.675, Personal history of other malignant  neoplasm of large intestine                           D12.2, Benign neoplasm of ascending colon CPT copyright 2016 American [employer] Association. All rights reserved. The codes documented in this report are preliminary and upon coder review may  be revised to meet current compliance requirements. Earle Gell, MD Garlan Fair, MD 08/02/2015 8:59:36 AM This report has been signed electronically. Number of Addenda: 0

## 2015-08-02 NOTE — Transfer of Care (Signed)
Immediate Anesthesia Transfer of Care Note  Patient: Angela Hart  Procedure(s) Performed: Procedure(s): COLONOSCOPY WITH PROPOFOL (N/A)  Patient Location: PACU  Anesthesia Type:MAC  Level of Consciousness:  sedated, patient cooperative and responds to stimulation  Airway & Oxygen Therapy:Patient Spontanous Breathing and Patient connected to face mask oxgen  Post-op Assessment:  Report given to PACU RN and Post -op Vital signs reviewed and stable  Post vital signs:  Reviewed and stable  Last Vitals:  Filed Vitals:   08/02/15 0647  BP: 118/72  Pulse: 62  Temp: 36.7 C  Resp: 16    Complications: No apparent anesthesia complications

## 2015-08-02 NOTE — H&P (Signed)
  Procedure: Surveillance colonoscopy. Lynch syndrome (PMS2) complicated by transverse colon cancer  History: The patient is a 65 year old female born [birth year]. She is scheduled to undergo surveillance colonoscopy today.  Past medical history: Lynch syndrome (PMS2) complicated by transverse colon cancer. Allergic rhinitis. Major depression. Mitral valve prolapse. Rosacea. Raynaud's phenomenon. Osteoarthritis. Lactose intolerance. Mild obstructive sleep apnea syndrome. Hypercholesterolemia. Right knee arthroscopy. Sinus surgery. Ovarian cystectomy. Morton's neuroma surgery. Varicose vein surgery.  Allergies: Lipitor causes myalgias  Exam: The patient is alert and lying comfortably on the endoscopy stretcher. Abdomen is soft and nontender to palpation. Lungs are clear to auscultation. Cardiac exam reveals a regular rhythm.  Plan: Proceed with surveillance colonoscopy

## 2015-08-02 NOTE — Anesthesia Postprocedure Evaluation (Signed)
Anesthesia Post Note  Patient: Angela Hart  Procedure(s) Performed: Procedure(s) (LRB): COLONOSCOPY WITH PROPOFOL (N/A)  Patient location during evaluation: PACU Anesthesia Type: MAC Level of consciousness: awake and alert Pain management: pain level controlled Vital Signs Assessment: post-procedure vital signs reviewed and stable Respiratory status: spontaneous breathing, nonlabored ventilation, respiratory function stable and patient connected to nasal cannula oxygen Cardiovascular status: stable and blood pressure returned to baseline Anesthetic complications: no     Last Vitals:  Filed Vitals:   08/02/15 0920 08/02/15 0930  BP: 129/69 130/69  Pulse: 54 63  Temp:    Resp: 10 15    Last Pain: There were no vitals filed for this visit. Pain Goal:                 Nalea Salce J

## 2015-08-02 NOTE — Discharge Instructions (Signed)

## 2015-08-03 ENCOUNTER — Encounter (HOSPITAL_COMMUNITY): Payer: Self-pay | Admitting: Gastroenterology

## 2016-02-16 ENCOUNTER — Other Ambulatory Visit: Payer: Self-pay | Admitting: Physician Assistant

## 2016-07-25 ENCOUNTER — Telehealth: Payer: Self-pay | Admitting: Internal Medicine

## 2016-07-31 NOTE — Telephone Encounter (Signed)
Dr.Pyrtle reviewed records and accepted for patient to be scheduled. Dr.Pyrtle noted in comments "Lynch syndrome-will need annual colonoscopy. She has used hydrotherapy for prep in the past, but I have not. Would use traditional bowel prep if okay with patient." Per patient's request has been scheduled an office visit before scheduling colonoscopy.

## 2016-09-10 ENCOUNTER — Encounter: Payer: Self-pay | Admitting: *Deleted

## 2016-09-13 ENCOUNTER — Encounter: Payer: Self-pay | Admitting: Internal Medicine

## 2016-09-13 ENCOUNTER — Ambulatory Visit (INDEPENDENT_AMBULATORY_CARE_PROVIDER_SITE_OTHER): Payer: Managed Care, Other (non HMO) | Admitting: Internal Medicine

## 2016-09-13 VITALS — BP 104/70 | HR 76 | Ht 65.75 in | Wt 141.0 lb

## 2016-09-13 DIAGNOSIS — Z1509 Genetic susceptibility to other malignant neoplasm: Secondary | ICD-10-CM | POA: Diagnosis not present

## 2016-09-13 DIAGNOSIS — Z85038 Personal history of other malignant neoplasm of large intestine: Secondary | ICD-10-CM | POA: Diagnosis not present

## 2016-09-13 MED ORDER — METOCLOPRAMIDE HCL 10 MG PO TABS: 10.0000 mg | ORAL_TABLET | Freq: Once | ORAL | 0 refills | Status: DC

## 2016-09-13 NOTE — Patient Instructions (Signed)
You have been scheduled for a colonoscopy. Please follow written instructions given to you at your visit today.  Please pick up your prep supplies at the pharmacy within the next 1-3 days. If you use inhalers (even only as needed), please bring them with you on the day of your procedure. Your physician has requested that you go to www.startemmi.com and enter the access code given to you at your visit today. This web site gives a general overview about your procedure. However, you should still follow specific instructions given to you by our office regarding your preparation for the procedure.  We have sent the following medications to your pharmacy for you to pick up at your convenience: Reglan 10 mg tablet  If you are age 64 or older, your body mass index should be between 23-30. Your Body mass index is 22.93 kg/m. If this is out of the aforementioned range listed, please consider follow up with your Primary Care Provider.  If you are age 13 or younger, your body mass index should be between 19-25. Your Body mass index is 22.93 kg/m. If this is out of the aformentioned range listed, please consider follow up with your Primary Care Provider.

## 2016-09-13 NOTE — Progress Notes (Signed)
Patient ID: Angela Hart, female   DOB: 1950-10-13, 66 y.o.   MRN: [id number] HPI: Angela Hart is a 66 year old female with a past medical history of Lynch syndrome, transverse colon cancer stage II status post adjuvant therapy and transverse colon resection, history of occasional GERD, hyperlipidemia, interstitial cystitis, history of depression who is seen in consult at the request of Dr. Laurann Montana and Dr. Wynetta Emery to discuss colon cancer surveillance colonoscopy. She is here alone today.  She reports she has a history of Lynch syndrome and follows with oncology, Dr. Berneice Gandy at [facility]. She has been diagnosed with Lynch syndrome and has had borderline elevated CEA levels.  She is currently asymptomatic and denies issues with diarrhea, constipation, abdominal pain, blood in her stool or melena. Denies dysphagia, odynophagia. Occasional heartburn treated well with ranitidine. Denies nausea, vomiting. No recent weight loss.  She reports a history of severe intolerance to oral colonoscopy preparation. She develops upper abdominal pain with frequent nausea and vomiting. This has led to multiple incomplete or poorly prepared colonoscopies. She feels that her colon cancer may have been "missed" due to colonoscopy with poor preparation.  Her last colonoscopy was on 08/02/2015 with Dr. Earle Gell. The prep was unsatisfactory. A 5 mm polyp was removed from the ascending colon.  She did have an upper endoscopy in July 2015 without pathology. She's also had prior video capsule endoscopy which was negative for pathology. Video capsule was performed in March 2014 and September 2016.  Her son also has Lynch syndrome. No other family history of colon cancer to her knowledge. She is a past smoker but none in many years. She does not drink alcohol.  Past Medical History:  Diagnosis Date  . Abdominal pain   . Anemia   . Anxiety   . Arthritis    hands/hip, gout left ankle  . Asthma    lungs damaged by chemo -  uses inhaler for "chemo toxicity "  . Blood transfusion 03/2010   at [facility]  . Colon cancer ([place]) 03/2010  . Concussion   . Depression   . Fatigue   . GERD (gastroesophageal reflux disease)    occasional  . Heart murmur    hx -no problems  . Hyperlipidemia   . Interstitial cystitis   . Lactose intolerance   . Lynch syndrome 12/22/2011  . Major depression    clinical- sees leslie brown  . MVP (mitral valve prolapse)    mild-seen on echo 2008  . Neuromuscular disorder ([place])    raynaud's disease hands/feet  . Neuropathy   . Obstructive sleep apnea    very mild  . Osteoarthritis   . Pelvic fracture ([place])    while riding a horse in 2008  . Peripheral neuropathy    mild- due to chemotherapy  . Raynaud's syndrome    hands/feet  . Serrated adenoma of colon   . SVD (spontaneous vaginal delivery)    x 1    Past Surgical History:  Procedure Laterality Date  . COLONOSCOPY WITH PROPOFOL N/A 06/10/2012   Procedure: COLONOSCOPY WITH PROPOFOL;  Surgeon: Garlan Fair, MD;  Location: [facility];  Service: Endoscopy;  Laterality: N/A;  . COLONOSCOPY WITH PROPOFOL N/A 09/22/2013   Procedure: COLONOSCOPY WITH PROPOFOL;  Surgeon: Garlan Fair, MD;  Location: [facility];  Service: Endoscopy;  Laterality: N/A;  . COLONOSCOPY WITH PROPOFOL N/A 08/02/2015   Procedure: COLONOSCOPY WITH PROPOFOL;  Surgeon: Garlan Fair, MD;  Location: [facility];  Service: Endoscopy;  Laterality: N/A;  .  ESOPHAGOGASTRODUODENOSCOPY (EGD) WITH PROPOFOL N/A 06/10/2012   Procedure: ESOPHAGOGASTRODUODENOSCOPY (EGD) WITH PROPOFOL;  Surgeon: Garlan Fair, MD;  Location: [facility];  Service: Endoscopy;  Laterality: N/A;  . ESOPHAGOGASTRODUODENOSCOPY (EGD) WITH PROPOFOL N/A 09/22/2013   Procedure: ESOPHAGOGASTRODUODENOSCOPY (EGD) WITH PROPOFOL;  Surgeon: Garlan Fair, MD;  Location: [facility];  Service: Endoscopy;  Laterality: N/A;  . FOOT NEUROMA SURGERY Right   . MENISCUS REPAIR Right    for  meniscus tear  . NASAL SINUS SURGERY  1993  . OVARIAN CYST REMOVAL     Laparotomy  . PARTIAL COLECTOMY  04/12/2010  . port a cath insertion  2012   for chemo use  . PORT-A-CATH REMOVAL  05/03/2011  . RETINAL TEAR REPAIR CRYOTHERAPY Right   . TOOTH EXTRACTION     molars  . TOTAL ABDOMINAL HYSTERECTOMY W/ BILATERAL SALPINGOOPHORECTOMY  2013  . VARICOSE VEIN SURGERY Bilateral     Outpatient Medications Prior to Visit  Medication Sig Dispense Refill  . Acetylcysteine (NAC 600 PO) Take 600 mg by mouth daily.    . Ascorbic Acid (VITAMIN C) 1000 MG tablet Take 1,000 mg by mouth 2 (two) times daily.     . ASTAXANTHIN PO Take 6 mg by mouth daily.     Marland Kitchen buPROPion (WELLBUTRIN XL) 300 MG 24 hr tablet Take 300 mg by mouth daily before breakfast.     . calcium carbonate (TUMS - DOSED IN MG ELEMENTAL CALCIUM) 500 MG chewable tablet Chew 1 tablet by mouth 3 (three) times daily as needed for indigestion or heartburn.     . Cholecalciferol (VITAMIN D) 2000 UNITS tablet Take 2,000 Units by mouth 2 (two) times daily.     . Coenzyme Q10 (CO Q 10) 100 MG CAPS Take 100 mg by mouth daily.    . Cyanocobalamin (VITAMIN B-12) 2500 MCG SUBL Place 2,500 mcg under the tongue daily.     . Efinaconazole (JUBLIA) 10 % SOLN Apply 1 application topically daily.    . fluticasone (FLONASE) 50 MCG/ACT nasal spray Place 1 spray into both nostrils daily.    . Glucosamine-Chondroit-Vit C-Mn (GLUCOSAMINE CHONDR 1500 COMPLX PO) Take 1 capsule by mouth 2 (two) times daily.     Marland Kitchen ibuprofen (ADVIL,MOTRIN) 200 MG tablet Take 200 mg by mouth every 6 (six) hours as needed for moderate pain.     Marland Kitchen levothyroxine (SYNTHROID, LEVOTHROID) 25 MCG tablet Take 25 mcg by mouth daily before breakfast.    . Magnesium 200 MG TABS Take 400 mg by mouth daily.     . Multiple Vitamin (MULTIVITAMIN WITH MINERALS) TABS Take 1 tablet by mouth daily.    Marland Kitchen NATTOKINASE PO Take 1 capsule by mouth daily.    Vladimir Faster Glycol-Propyl Glycol (SYSTANE) 0.4-0.3  % SOLN Apply 1 drop to eye 3 (three) times daily as needed (dry eyes).    . ranitidine (ZANTAC) 150 MG tablet Take 150 mg by mouth 2 (two) times daily as needed for heartburn.    . tacrolimus (PROTOPIC) 0.1 % ointment Apply 1 application topically at bedtime. Applies for dermatitis.    Marland Kitchen thyroid (NATURE-THROID) 65 MG tablet Take 65 mg by mouth daily.     . Vitamin K, Phytonadione, 100 MCG TABS Take 100 mcg by mouth daily.    Marland Kitchen escitalopram (LEXAPRO) 20 MG tablet Take 20 mg by mouth daily before breakfast.      No facility-administered medications prior to visit.     No Known Allergies  Family History  Problem Relation Age  of Onset  . Heart disease Mother   . Heart attack Mother   . Heart disease Father   . Heart attack Father   . Heart disease Brother   . Heart attack Brother   . Other Brother        Lynch syndrome-PMS2  . Colon polyps Brother   . Other Brother        Lynch syndrome-PMS2  . Sleep apnea Brother   . Other Son        lynch syndrome-PMS2  . Heart disease Maternal Grandmother   . Heart attack Maternal Grandmother   . Heart disease Maternal Grandfather   . Heart attack Maternal Grandfather   . Alzheimer's disease Paternal Grandmother   . Emphysema Paternal Grandfather     Social History  Substance Use Topics  . Smoking status: Former Smoker    Quit date: 03/13/1971  . Smokeless tobacco: Never Used     Comment: high school years  . Alcohol use No    ROS: As per history of present illness, otherwise negative  BP 104/70 (BP Location: Left Arm, Patient Position: Sitting, Cuff Size: Normal)   Pulse 76   Ht 5' 5.75" (1.67 m) Comment: height measured without shoes  Wt 141 lb (64 kg)   BMI 22.93 kg/m  Constitutional: Well-developed and well-nourished. No distress. HEENT: Normocephalic and atraumatic. Oropharynx is clear and moist. Conjunctivae are normal.  No scleral icterus. Neck: Neck supple. Trachea midline. Cardiovascular: Normal rate, regular rhythm and  intact distal pulses. No M/R/G Pulmonary/chest: Effort normal and breath sounds normal. No wheezing, rales or rhonchi. Abdominal: Soft, nontender, nondistended. Bowel sounds active throughout. There are no masses palpable. No hepatosplenomegaly. Well-healed vertical abdominal incision/scar Extremities: no clubbing, cyanosis, or edema Neurological: Alert and oriented to person place and time. Skin: Skin is warm and dry. 2 lesions on the left lower extremity which she knows to be a squamous cell skin cancer being treated Psychiatric: Normal mood and affect. Behavior is normal.  Last CT scan performed of the chest abdomen pelvis on 09/21/2015. No evident findings to suggest metastatic disease, 8 mm right upper lobe pulmonary nodule stable.  ASSESSMENT/PLAN: 66 year old female with a past medical history of Lynch syndrome, transverse colon cancer stage II status post adjuvant therapy and transverse colon resection, history of occasional GERD, hyperlipidemia, interstitial cystitis, history of depression who is seen in consult at the request of Dr. Laurann Montana and Dr. Wynetta Emery to discuss colon cancer surveillance colonoscopy.  1. Personal history of colon cancer in the setting of Lynch syndrome -- the patient is overdue for colonoscopy for cancer surveillance and her history of Lynch syndrome. She is aware of the recommendation to perform colonoscopy for screening every 1 year. She is aware and we discussed the higher risk of endometrial cancer, cancers of the urinary tract, stomach, ovary, small bowel and brain. --She has had significant trouble and tried multiple colonoscopy preps in the past which lead to nausea, vomiting, mild hypoglycemia and incomplete/poor preparations. Poor preparation can significantly limit my ability to detect precancerous colon lesions. --We discussed various options for colon preparation and she previously discussed colonic hydrotherapy with Dr. Wynetta Emery. Dr. Wynetta Emery has used this  before access. I let her know that I have not used this colonoscopy preparation technique before but I am open to it. --We will have her do one half of a MiraLAX prep over 3 hours the day before her colonoscopy. Reglan 10 mg one hour before bowel preparation. --On the morning of her  procedure she will see Buck Mam at [facility] for colonic hydrotherapy treatment. Thereafter she will present for her colonoscopy. --I've also recommended upper endoscopy to screen for stomach cancer in the setting of Lynch syndrome --We discussed the risks, benefits and alternatives to upper endoscopy and colonoscopy and she is agreeable and wishes to proceed --We'll consider video capsule endoscopy if Dr. Berneice Gandy recommends this test for screening.   IO:[id number], John, [address], [place] [address]  Earle Gell, M.D.

## 2016-09-21 ENCOUNTER — Telehealth: Payer: Self-pay | Admitting: Internal Medicine

## 2016-09-21 NOTE — Telephone Encounter (Signed)
Left message for pt to call back  °

## 2016-09-25 NOTE — Telephone Encounter (Signed)
Pt states that the [facility] will not schedule her an appt for prep until we send a letter stating that it is ok for her to have this done given her history of colon cancer. Pt states they are holding one appt for her but she needs this done asap. Pt wants the letter mailed to [facility]. Dr. Hilarie Fredrickson please advise.

## 2016-09-25 NOTE — Telephone Encounter (Signed)
Ok for such letter.

## 2016-09-25 NOTE — Telephone Encounter (Signed)
Letter mailed to [facility] for pt.

## 2016-09-25 NOTE — Telephone Encounter (Signed)
Pt did not return the call. 

## 2016-11-08 ENCOUNTER — Other Ambulatory Visit: Payer: Self-pay | Admitting: Physician Assistant

## 2016-11-26 ENCOUNTER — Telehealth: Payer: Self-pay

## 2016-11-26 ENCOUNTER — Ambulatory Visit (AMBULATORY_SURGERY_CENTER): Payer: Managed Care, Other (non HMO) | Admitting: Internal Medicine

## 2016-11-26 ENCOUNTER — Encounter: Payer: Self-pay | Admitting: Internal Medicine

## 2016-11-26 VITALS — BP 110/54 | HR 64 | Temp 95.5°F | Resp 14 | Ht 65.75 in | Wt 141.0 lb

## 2016-11-26 DIAGNOSIS — D122 Benign neoplasm of ascending colon: Secondary | ICD-10-CM | POA: Diagnosis not present

## 2016-11-26 DIAGNOSIS — Z85038 Personal history of other malignant neoplasm of large intestine: Secondary | ICD-10-CM

## 2016-11-26 DIAGNOSIS — Z1509 Genetic susceptibility to other malignant neoplasm: Secondary | ICD-10-CM

## 2016-11-26 MED ORDER — SODIUM CHLORIDE 0.9 % IV SOLN: 500.0000 mL | INTRAVENOUS | Status: DC

## 2016-11-26 NOTE — Op Note (Signed)
[facility] Patient Name: Angela Hart Procedure Date: 11/26/2016 10:42 AM MRN: [id number] Endoscopist: Jerene Bears , MD Age: 66 Referring MD:  Date of Birth: 07-15-1950 Gender: Female Account #: [id number] Procedure:                Colonoscopy Indications:              High risk colon cancer surveillance: Personal                            history of colon cancer, Personal history of                            hereditary nonpolyposis colorectal cancer Medicines:                Monitored Anesthesia Care Procedure:                Pre-Anesthesia Assessment:                           - Prior to the procedure, a History and Physical                            was performed, and patient medications and                            allergies were reviewed. The patient's tolerance of                            previous anesthesia was also reviewed. The risks                            and benefits of the procedure and the sedation                            options and risks were discussed with the patient.                            All questions were answered, and informed consent                            was obtained. Prior Anticoagulants: The patient has                            taken no previous anticoagulant or antiplatelet                            agents. ASA Grade Assessment: II - A patient with                            mild systemic disease. After reviewing the risks                            and benefits, the patient was deemed in  satisfactory condition to undergo the procedure.                           After obtaining informed consent, the colonoscope                            was passed under direct vision. Throughout the                            procedure, the patient's blood pressure, pulse, and                            oxygen saturations were monitored continuously. The                            Colonoscope was introduced  through the anus and                            advanced to the the cecum, identified by                            appendiceal orifice and ileocecal valve. The                            colonoscopy was performed without difficulty. The                            patient tolerated the procedure well. The quality                            of the bowel preparation was inadequate. The                            ileocecal valve, appendiceal orifice, and rectum                            were photographed. The bowel preparation used was                            colon hydrotherapy, performed prior to the                            procedure by the colon hydrotherapist. 1/2 MiraLax                            prep day before hydrotherapy. Scope In: 11:00:02 AM Scope Out: 11:20:13 AM Scope Withdrawal Time: 0 hours 15 minutes 34 seconds  Total Procedure Duration: 0 hours 20 minutes 11 seconds  Findings:                 The digital rectal exam was normal.                           A large amount of semi-liquid semi-solid stool was  found in the entire colon, interfering with                            visualization.                           A 7 mm polyp was found in the ascending colon. The                            polyp was sessile. The polyp was removed with a                            cold snare. Resection and retrieval were complete.                           A 4 mm polyp was found in the ascending colon. The                            polyp was sessile. The polyp was removed with a                            cold snare. Resection and retrieval were complete.                           There was evidence of a prior end-to-side                            colo-colonic anastomosis in the transverse colon.                            This was patent. The anastomosis was traversed.                           A few small-mouthed diverticula were found in the                             descending colon and transverse colon.                           The retroflexed view of the distal rectum and anal                            verge was normal and showed no anal or rectal                            abnormalities. Complications:            No immediate complications. Estimated Blood Loss:     Estimated blood loss was minimal. Impression:               - Preparation of the colon was inadequate,                            particularly at and proximal to the colonic  anastomosis.                           - One 7 mm polyp in the ascending colon, removed                            with a cold snare. Resected and retrieved.                           - One 4 mm polyp in the ascending colon, removed                            with a cold snare. Resected and retrieved.                           - Patent end-to-side colo-colonic anastomosis.                           - Diverticulosis in the descending colon and in the                            transverse colon.                           - The distal rectum and anal verge are normal on                            retroflexion view. Recommendation:           - Patient has a contact number available for                            emergencies. The signs and symptoms of potential                            delayed complications were discussed with the                            patient. Return to normal activities tomorrow.                            Written discharge instructions were provided to the                            patient.                           - Resume previous diet.                           - Continue present medications.                           - Await pathology results.                           - Repeat  colonoscopy in less than 1 year for                            surveillance given quality of the bowel preparation. Jerene Bears, MD 11/26/2016 11:33:39 AM This report  has been signed electronically.

## 2016-11-26 NOTE — Progress Notes (Signed)
Called to room to assist during endoscopic procedure.  Patient ID and intended procedure confirmed with present staff. Received instructions for my participation in the procedure from the performing physician.  

## 2016-11-26 NOTE — Op Note (Signed)
[facility] Patient Name: Analy Hart Procedure Date: 11/26/2016 10:43 AM MRN: [id number] Endoscopist: Jerene Bears , MD Age: 66 Referring MD:  Date of Birth: 29-Aug-1950 Gender: Female Account #: [id number] Procedure:                Upper GI endoscopy Indications:              Hereditary nonpolyposis colorectal cancer (Lynch                            Syndrome) Medicines:                Monitored Anesthesia Care Procedure:                Pre-Anesthesia Assessment:                           - Prior to the procedure, a History and Physical                            was performed, and patient medications and                            allergies were reviewed. The patient's tolerance of                            previous anesthesia was also reviewed. The risks                            and benefits of the procedure and the sedation                            options and risks were discussed with the patient.                            All questions were answered, and informed consent                            was obtained. Prior Anticoagulants: The patient has                            taken no previous anticoagulant or antiplatelet                            agents. ASA Grade Assessment: II - A patient with                            mild systemic disease. After reviewing the risks                            and benefits, the patient was deemed in                            satisfactory condition to undergo the procedure.  After obtaining informed consent, the endoscope was                            passed under direct vision. Throughout the                            procedure, the patient's blood pressure, pulse, and                            oxygen saturations were monitored continuously. The                            Endoscope was introduced through the mouth, and                            advanced to the second part of duodenum. The  upper                            GI endoscopy was accomplished without difficulty.                            The patient tolerated the procedure well. Scope In: Scope Out: Findings:                 The examined esophagus was normal.                           A small hiatal hernia was present.                           Localized mild inflammation characterized by linear                            erosions (Cameron's) was found in the cardia and in                            the gastric fundus.                           The exam of the stomach was otherwise normal.                           Biopsies were taken with a cold forceps in the                            gastric body, at the incisura and in the gastric                            antrum for histology and Helicobacter pylori                            testing.                           The examined duodenum was normal. Complications:  No immediate complications. Estimated Blood Loss:     Estimated blood loss was minimal. Impression:               - Normal esophagus.                           - Small hiatal hernia with Cameron's erosion.                           - Otherwise normal stomach mucosa.                           - Normal examined duodenum.                           - Biopsies were taken with a cold forceps for                            histology and Helicobacter pylori testing. Recommendation:           - Patient has a contact number available for                            emergencies. The signs and symptoms of potential                            delayed complications were discussed with the                            patient. Return to normal activities tomorrow.                            Written discharge instructions were provided to the                            patient.                           - Resume previous diet.                           - Continue present medications.                            - Await pathology results. Jerene Bears, MD 11/26/2016 11:27:10 AM This report has been signed electronically.

## 2016-11-26 NOTE — Telephone Encounter (Signed)
-----   Message from Jerene Bears, MD sent at 11/26/2016 11:49 AM EDT ----- Regarding: Referral; pls refer pt to Dr. Harriet Butte at [facility] for consideration of subtotal colectomy for hx of colon cancer, Lynch syndrome and inability to tolerated preparations for annual colonoscopies Thanks JMP

## 2016-11-26 NOTE — Telephone Encounter (Signed)
Pt scheduled to see Dr. Drue Flirt at [facility] cancer center on the 4th floor on 12/05/16@8 :30am. Left message for pt to call back. Records faxed to [phone].

## 2016-11-26 NOTE — Progress Notes (Signed)
Report given to PACU, vss 

## 2016-11-26 NOTE — Telephone Encounter (Signed)
Spoke with pt and she is aware of appt. 

## 2016-11-26 NOTE — Patient Instructions (Signed)
Impression/Recommendations:  Hiatal hernia handout given to patient. Polyp handout given to patient. Diverticulosis handout given to patient.  Resume previous diet. Continue present medications.  Await pathology results.  Repeat colonoscopy in 1 year for surveillance.  YOU HAD AN ENDOSCOPIC PROCEDURE TODAY AT [facility]:   Refer to the procedure report that was given to you for any specific questions about what was found during the examination.  If the procedure report does not answer your questions, please call your gastroenterologist to clarify.  If you requested that your care partner not be given the details of your procedure findings, then the procedure report has been included in a sealed envelope for you to review at your convenience later.  YOU SHOULD EXPECT: Some feelings of bloating in the abdomen. Passage of more gas than usual.  Walking can help get rid of the air that was put into your GI tract during the procedure and reduce the bloating. If you had a lower endoscopy (such as a colonoscopy or flexible sigmoidoscopy) you may notice spotting of blood in your stool or on the toilet paper. If you underwent a bowel prep for your procedure, you may not have a normal bowel movement for a few days.  Please Note:  You might notice some irritation and congestion in your nose or some drainage.  This is from the oxygen used during your procedure.  There is no need for concern and it should clear up in a day or so.  SYMPTOMS TO REPORT IMMEDIATELY:   Following lower endoscopy (colonoscopy or flexible sigmoidoscopy):  Excessive amounts of blood in the stool  Significant tenderness or worsening of abdominal pains  Swelling of the abdomen that is new, acute  Fever of 100F or higher   Following upper endoscopy (EGD)  Vomiting of blood or coffee ground material  New chest pain or pain under the shoulder blades  Painful or persistently difficult swallowing  New shortness  of breath  Fever of 100F or higher  Black, tarry-looking stools  For urgent or emergent issues, a gastroenterologist can be reached at any hour by calling [phone].   DIET:  We do recommend a small meal at first, but then you may proceed to your regular diet.  Drink plenty of fluids but you should avoid alcoholic beverages for 24 hours.  ACTIVITY:  You should plan to take it easy for the rest of today and you should NOT DRIVE or use heavy machinery until tomorrow (because of the sedation medicines used during the test).    FOLLOW UP: Our staff will call the number listed on your records the next business day following your procedure to check on you and address any questions or concerns that you may have regarding the information given to you following your procedure. If we do not reach you, we will leave a message.  However, if you are feeling well and you are not experiencing any problems, there is no need to return our call.  We will assume that you have returned to your regular daily activities without incident.  If any biopsies were taken you will be contacted by phone or by letter within the next 1-3 weeks.  Please call us at [phone] if you have not heard about the biopsies in 3 weeks.    SIGNATURES/CONFIDENTIALITY: You and/or your care partner have signed paperwork which will be entered into your electronic medical record.  These signatures attest to the fact that that the information above on  your After Visit Summary has been reviewed and is understood.  Full responsibility of the confidentiality of this discharge information lies with you and/or your care-partner. 

## 2016-11-27 ENCOUNTER — Telehealth: Payer: Self-pay | Admitting: *Deleted

## 2016-11-27 NOTE — Telephone Encounter (Signed)
  Follow up Call-  Call back number 11/26/2016  Post procedure Call Back phone  # [phone]  Permission to leave phone message Yes  Some recent data might be hidden     Patient questions:  Do you have a fever, pain , or abdominal swelling? No. Pain Score  0 *  Have you tolerated food without any problems? Yes.    Have you been able to return to your normal activities? Yes.    Do you have any questions about your discharge instructions: Diet   No. Medications  No. Follow up visit  No.  Do you have questions or concerns about your Care? No.  Actions: * If pain score is 4 or above: No action needed, pain <4.

## 2016-11-28 ENCOUNTER — Telehealth: Payer: Self-pay | Admitting: Internal Medicine

## 2016-11-28 NOTE — Telephone Encounter (Signed)
Records faxed, path result not back yet. Left message informing Verdis Frederickson.

## 2016-11-30 NOTE — Telephone Encounter (Signed)
Pathology report faxed.

## 2016-12-03 ENCOUNTER — Encounter: Payer: Self-pay | Admitting: Internal Medicine

## 2017-01-21 ENCOUNTER — Telehealth: Payer: Self-pay | Admitting: Internal Medicine

## 2017-01-21 NOTE — Telephone Encounter (Signed)
Left message for pt to call back  °

## 2017-01-21 NOTE — Telephone Encounter (Signed)
Pt scheduled for surgery on Monday with Dr. Drue Flirt. She is very anxious about the surgery and is wondering if she will go through with it. Reports she has talked to several patients and they have had lots of problems following the surgery. Pt has an appt with Dr. Drue Flirt on Wednesday. Encouraged her to talk with Dr. Drue Flirt about her questions and concerns. Pt also wanted to know if she decided not to have the surgery if Dr. Hilarie Fredrickson would continue to follow her or if he could refer her to someone that would. Again encouraged her to speak with Dr. Drue Flirt regarding these concerns. Discussed with her that if she did not have the surgery Dr. Drue Flirt may have a physician at [facility] to refer her to regarding her GI care. Pt verbalized understanding.

## 2017-01-21 NOTE — Telephone Encounter (Signed)
Patient states she is scheduled to have large intestine removed at [facility] where Dr.Pyrtle referred her to. Patient wanting a call to discuss further gi care after surgery.

## 2017-03-21 ENCOUNTER — Other Ambulatory Visit: Payer: Self-pay | Admitting: Dermatology

## 2017-05-02 ENCOUNTER — Telehealth: Payer: Self-pay | Admitting: Internal Medicine

## 2017-05-02 NOTE — Telephone Encounter (Signed)
Patient states that she was seen by Dr.Ashburn as referred from Dr.Pyrtle. Pt would like to speak with someone about what was reviewed with Dr.Ashburn.

## 2017-05-02 NOTE — Telephone Encounter (Signed)
Pt states that she saw Dr. Drue Flirt yesterday and she needs to try to prep for colon with Dr. Hilarie Fredrickson.  Pt states she has been taking a supplement "AFEA" and her digestive system is 60-70% better. She is not having the bloating and nausea that she did have. States she is willing to try a split dose low volume prep. She is also willing to have another colonic. Spoke with Dr. Hilarie Fredrickson and he is willing to try this again but if this cannot be done then he feels she has to have the surgery. He would like pt to try Plenvu with Reglan prior to each dose of prep, would like pt to have a colonic the day of the prep when she is on clear liquids.

## 2017-05-07 NOTE — Telephone Encounter (Signed)
Pt aware. Pt scheduled for previsit and colon in the [place]. Pt aware of appts.

## 2017-05-07 NOTE — Telephone Encounter (Signed)
Per Dr. Hilarie Fredrickson pt could have a colonic done the day of the prep and reglan before each dose of prep. Pt would like to try the lower volume split dose prep. Left message for pt to call back.

## 2017-05-20 ENCOUNTER — Telehealth: Payer: Self-pay | Admitting: Internal Medicine

## 2017-05-20 NOTE — Telephone Encounter (Signed)
Left message on machine to call back  

## 2017-05-20 NOTE — Telephone Encounter (Signed)
Dr Hilarie Fredrickson the pt states that she is unable to have colonic no one in the area is willing to do it.  She says she will do the plenvu prep and is motivated to have the procedure.  Is this ok?

## 2017-05-20 NOTE — Telephone Encounter (Signed)
Yes, but I would recommend a 2 day prep (if pt willing) with MiraLax and then Plenvu Reglan can be offered 10 mg 30 minutes before each 1/2 of prep

## 2017-05-22 NOTE — Telephone Encounter (Signed)
Left message for pt to call back  °

## 2017-05-23 NOTE — Telephone Encounter (Signed)
Pt aware and will keep her appts.

## 2017-06-27 ENCOUNTER — Encounter: Payer: Self-pay | Admitting: Internal Medicine

## 2017-06-27 ENCOUNTER — Ambulatory Visit (AMBULATORY_SURGERY_CENTER): Payer: Self-pay

## 2017-06-27 VITALS — Ht 66.5 in | Wt 140.4 lb

## 2017-06-27 DIAGNOSIS — Z85038 Personal history of other malignant neoplasm of large intestine: Secondary | ICD-10-CM

## 2017-06-27 DIAGNOSIS — Z1509 Genetic susceptibility to other malignant neoplasm: Secondary | ICD-10-CM

## 2017-06-27 MED ORDER — METOCLOPRAMIDE HCL 10 MG PO TABS: 10.0000 mg | ORAL_TABLET | ORAL | Status: DC | PRN

## 2017-06-27 MED ORDER — PEG-KCL-NACL-NASULF-NA ASC-C 140 G PO SOLR: 1.0000 | Freq: Once | ORAL | Status: AC

## 2017-06-27 NOTE — Progress Notes (Signed)
Per pt, no allergies to soy or egg products.Pt not taking any weight loss meds or using  O2 at home.  Pt refused emmi video.  During the PV, pt states she will not be able to do 3 days of a bowel. prep. She will start clear liquids 2 days prior to the colon, but will not be doing the colonic cleanse either.   Pt was given a sample of Plenvu  prep and was advised on how to drink the prep. Reglan 10 mg was sent to the pharmacy for pt to take 1 pill  30 minutes prior to each bowel prep. She understood. She will call if she has any problems or questions. Angela Hart

## 2017-07-10 ENCOUNTER — Encounter: Payer: Self-pay | Admitting: Internal Medicine

## 2017-07-10 ENCOUNTER — Ambulatory Visit (AMBULATORY_SURGERY_CENTER): Payer: Managed Care, Other (non HMO) | Admitting: Internal Medicine

## 2017-07-10 ENCOUNTER — Other Ambulatory Visit: Payer: Self-pay

## 2017-07-10 VITALS — BP 98/52 | HR 61 | Temp 96.6°F | Resp 8 | Ht 65.75 in | Wt 141.0 lb

## 2017-07-10 DIAGNOSIS — D123 Benign neoplasm of transverse colon: Secondary | ICD-10-CM

## 2017-07-10 DIAGNOSIS — Z1509 Genetic susceptibility to other malignant neoplasm: Secondary | ICD-10-CM

## 2017-07-10 DIAGNOSIS — D122 Benign neoplasm of ascending colon: Secondary | ICD-10-CM | POA: Diagnosis not present

## 2017-07-10 DIAGNOSIS — K621 Rectal polyp: Secondary | ICD-10-CM

## 2017-07-10 DIAGNOSIS — Z85038 Personal history of other malignant neoplasm of large intestine: Secondary | ICD-10-CM

## 2017-07-10 DIAGNOSIS — D128 Benign neoplasm of rectum: Secondary | ICD-10-CM

## 2017-07-10 MED ORDER — SODIUM CHLORIDE 0.9 % IV SOLN: 500.0000 mL | Freq: Once | INTRAVENOUS | Status: DC

## 2017-07-10 NOTE — Progress Notes (Signed)
To recovery, report to RN, VSS. 

## 2017-07-10 NOTE — Op Note (Signed)
[facility] Patient Name: Angela Hart Procedure Date: 07/10/2017 8:05 AM MRN: [id number] Endoscopist: Jerene Bears , MD Age: 67 Referring MD:  Date of Birth: 1950-08-13 Gender: Female Account #: [id number] Procedure:                Colonoscopy Indications:              High risk colon cancer surveillance: Personal                            history of hereditary nonpolyposis colorectal                            cancer (Lynch Syndrome) Medicines:                Monitored Anesthesia Care Procedure:                Pre-Anesthesia Assessment:                           - Prior to the procedure, a History and Physical                            was performed, and patient medications and                            allergies were reviewed. The patient's tolerance of                            previous anesthesia was also reviewed. The risks                            and benefits of the procedure and the sedation                            options and risks were discussed with the patient.                            All questions were answered, and informed consent                            was obtained. Prior Anticoagulants: The patient has                            taken no previous anticoagulant or antiplatelet                            agents. ASA Grade Assessment: II - A patient with                            mild systemic disease. After reviewing the risks                            and benefits, the patient was deemed in  satisfactory condition to undergo the procedure.                           After obtaining informed consent, the colonoscope                            was passed under direct vision. Throughout the                            procedure, the patient's blood pressure, pulse, and                            oxygen saturations were monitored continuously. The                            Colonoscope was introduced through the anus  and                            advanced to the cecum, identified by appendiceal                            orifice and ileocecal valve. The colonoscopy was                            performed without difficulty. The patient tolerated                            the procedure well. The quality of the bowel                            preparation was good with 2 day prep. The ileocecal                            valve, appendiceal orifice, and rectum were                            photographed. Scope In: 8:17:43 AM Scope Out: 8:40:38 AM Scope Withdrawal Time: 0 hours 17 minutes 20 seconds  Total Procedure Duration: 0 hours 22 minutes 55 seconds  Findings:                 The digital rectal exam was normal.                           Four sessile polyps were found in the ascending                            colon. The polyps were 3 to 7 mm in size. These                            polyps were removed with a cold snare. Resection                            and retrieval were complete.  A 6 mm polyp was found in the transverse colon. The                            polyp was sessile. The polyp was removed with a                            cold snare. Resection and retrieval were complete.                           A 2 mm polyp was found in the rectum. The polyp was                            sessile. The polyp was removed with a cold snare.                            Resection and retrieval were complete.                           Scattered small-mouthed diverticula were found in                            the sigmoid colon, descending colon and transverse                            colon.                           There was evidence of a prior end-to-side                            colo-colonic anastomosis in the transverse colon.                            This was patent and was characterized by healthy                            appearing mucosa.                            The retroflexed view of the distal rectum and anal                            verge was normal and showed no anal or rectal                            abnormalities. Complications:            No immediate complications. Estimated Blood Loss:     Estimated blood loss was minimal. Impression:               - Four 3 to 7 mm polyps in the ascending colon,                            removed with a cold snare. Resected and retrieved.                           -  One 6 mm polyp in the transverse colon, removed                            with a cold snare. Resected and retrieved.                           - One 2 mm polyp in the rectum, removed with a cold                            snare. Resected and retrieved.                           - Mild diverticulosis in the sigmoid colon, in the                            descending colon and in the transverse colon.                           - Patent end-to-side colo-colonic anastomosis,                            characterized by healthy appearing mucosa.                           - The distal rectum and anal verge are normal on                            retroflexion view. Recommendation:           - Patient has a contact number available for                            emergencies. The signs and symptoms of potential                            delayed complications were discussed with the                            patient. Return to normal activities tomorrow.                            Written discharge instructions were provided to the                            patient.                           - Resume previous diet.                           - Continue present medications.                           - Await pathology results.                           -  Repeat colonoscopy in 1 year for surveillance                            with same prep + metoclopramide before each half of                            prep. Jerene Bears, MD 07/10/2017  8:52:45 AM This report has been signed electronically.

## 2017-07-10 NOTE — Progress Notes (Signed)
Called to room to assist during endoscopic procedure.  Patient ID and intended procedure confirmed with present staff. Received instructions for my participation in the procedure from the performing physician.  

## 2017-07-10 NOTE — Progress Notes (Signed)
No changes in medical or surgical hx since PV per pt 

## 2017-07-10 NOTE — Patient Instructions (Signed)
YOU HAD AN ENDOSCOPIC PROCEDURE TODAY AT [facility]:   Refer to the procedure report that was given to you for any specific questions about what was found during the examination.  If the procedure report does not answer your questions, please call your gastroenterologist to clarify.  If you requested that your care partner not be given the details of your procedure findings, then the procedure report has been included in a sealed envelope for you to review at your convenience later.  YOU SHOULD EXPECT: Some feelings of bloating in the abdomen. Passage of more gas than usual.  Walking can help get rid of the air that was put into your GI tract during the procedure and reduce the bloating. If you had a lower endoscopy (such as a colonoscopy or flexible sigmoidoscopy) you may notice spotting of blood in your stool or on the toilet paper. If you underwent a bowel prep for your procedure, you may not have a normal bowel movement for a few days.  Please Note:  You might notice some irritation and congestion in your nose or some drainage.  This is from the oxygen used during your procedure.  There is no need for concern and it should clear up in a day or so.  SYMPTOMS TO REPORT IMMEDIATELY:   Following lower endoscopy (colonoscopy or flexible sigmoidoscopy):  Excessive amounts of blood in the stool  Significant tenderness or worsening of abdominal pains  Swelling of the abdomen that is new, acute  Fever of 100F or higher  For urgent or emergent issues, a gastroenterologist can be reached at any hour by calling [phone].   DIET:  We do recommend a small meal at first, but then you may proceed to your regular diet.  Drink plenty of fluids but you should avoid alcoholic beverages for 24 hours.  ACTIVITY:  You should plan to take it easy for the rest of today and you should NOT DRIVE or use heavy machinery until tomorrow (because of the sedation medicines used during the test).     FOLLOW UP: Our staff will call the number listed on your records the next business day following your procedure to check on you and address any questions or concerns that you may have regarding the information given to you following your procedure. If we do not reach you, we will leave a message.  However, if you are feeling well and you are not experiencing any problems, there is no need to return our call.  We will assume that you have returned to your regular daily activities without incident.  If any biopsies were taken you will be contacted by phone or by letter within the next 1-3 weeks.  Please call us at [phone] if you have not heard about the biopsies in 3 weeks.    SIGNATURES/CONFIDENTIALITY: You and/or your care partner have signed paperwork which will be entered into your electronic medical record.  These signatures attest to the fact that that the information above on your After Visit Summary has been reviewed and is understood.  Full responsibility of the confidentiality of this discharge information lies with you and/or your care-partner.  Polyp, diverticulosis information given.  Recall colonoscopy 1 year.

## 2017-07-11 ENCOUNTER — Telehealth: Payer: Self-pay

## 2017-07-11 NOTE — Telephone Encounter (Signed)
  Follow up Call-  Call back number 07/10/2017 11/26/2016  Post procedure Call Back phone  # [phone]  Permission to leave phone message Yes Yes  Some recent data might be hidden     Patient questions:  Do you have a fever, pain , or abdominal swelling? No. Pain Score  0 *  Have you tolerated food without any problems? Yes.    Have you been able to return to your normal activities? Yes.    Do you have any questions about your discharge instructions: Diet   No. Medications  No. Follow up visit  No.  Do you have questions or concerns about your Care? No.  Actions: * If pain score is 4 or above: No action needed, pain <4.

## 2017-07-17 ENCOUNTER — Encounter: Payer: Self-pay | Admitting: Internal Medicine

## 2017-08-02 ENCOUNTER — Other Ambulatory Visit: Payer: Self-pay | Admitting: Physician Assistant

## 2018-01-08 ENCOUNTER — Other Ambulatory Visit: Payer: Self-pay | Admitting: Physician Assistant

## 2018-05-15 NOTE — Progress Notes (Signed)
Patient referred by Angela Hart for arm pain and cardiac risk stratification.   Subjective:   @Patient  ID: Angela Hart, female    DOB: [birth year], 68 y.o.   MRN: [id number]  Chief Complaint  Patient presents with  . Chest Pain    arm pain both, left side of neck to jaw  . New Patient (Initial Visit)     Angela Hart is a 68 year old female with history of Lynch syndrome underwent prophylactic hysterectomy- for cancer (Lynch Syndrome: Colon cancer and Uterus and less likely other organs). She has had partial colectomy for colon cancer. She was last seen by [place] is 2014.   She was told to have Printzemal angina in 2014 and were previously occurring every 5-6 months; however, now occurs more frequently. She does not have any nitroglycerin. Has some chest discomfort that radiates up to her jaw. She was started on Atorvastatin in 2014; however, had significant myalgia symptoms with this and is now off. She reports at her last check in 2018, lipids continued to be elevated.   She does exercise with walking 2 1/2 miles daily and strength training every other day. Does not occur exercise.    Past Medical History:  Diagnosis Date  . Abdominal pain   . Anemia   . Anxiety   . Arthritis    hands/hip, gout left ankle  . Asthma    lungs damaged by chemo - uses inhaler for "chemo toxicity "  . Blood transfusion 03/2010   at [facility]  . Cataract   . Colon cancer ([place]) 03/2010  . Concussion    per pt, has had 2-3 concussions due to horse injuries  . Depression   . Fatigue   . GERD (gastroesophageal reflux disease)    occasional  . Heart murmur    hx -no problems  . Hyperlipidemia   . Interstitial cystitis   . Lactose intolerance   . Lynch syndrome 12/22/2011  . Major depression    clinical- sees leslie brown  . MVP (mitral valve prolapse)    mild-seen on echo 2008  . Neuromuscular disorder ([place])    raynaud's disease hands/feet  . Neuropathy   . Obstructive sleep apnea      very mild  . Osteoarthritis   . Pelvic fracture ([place])    while riding a horse in 2008  . Peripheral neuropathy    mild- due to chemotherapy  . Raynaud's syndrome    hands/feet  . Serrated adenoma of colon   . SVD (spontaneous vaginal delivery)    x 1  . Thyroid disease     Past Surgical History:  Procedure Laterality Date  . BUNIONECTOMY     Bil/ hammer toe on right foot  . COLONOSCOPY WITH PROPOFOL N/A 06/10/2012   Procedure: COLONOSCOPY WITH PROPOFOL;  Surgeon: Garlan Fair, MD;  Location: [facility];  Service: Endoscopy;  Laterality: N/A;  . COLONOSCOPY WITH PROPOFOL N/A 09/22/2013   Procedure: COLONOSCOPY WITH PROPOFOL;  Surgeon: Garlan Fair, MD;  Location: [facility];  Service: Endoscopy;  Laterality: N/A;  . COLONOSCOPY WITH PROPOFOL N/A 08/02/2015   Procedure: COLONOSCOPY WITH PROPOFOL;  Surgeon: Garlan Fair, MD;  Location: [facility];  Service: Endoscopy;  Laterality: N/A;  . ESOPHAGOGASTRODUODENOSCOPY (EGD) WITH PROPOFOL N/A 06/10/2012   Procedure: ESOPHAGOGASTRODUODENOSCOPY (EGD) WITH PROPOFOL;  Surgeon: Garlan Fair, MD;  Location: [facility];  Service: Endoscopy;  Laterality: N/A;  . ESOPHAGOGASTRODUODENOSCOPY (EGD) WITH PROPOFOL N/A 09/22/2013   Procedure:  ESOPHAGOGASTRODUODENOSCOPY (EGD) WITH PROPOFOL;  Surgeon: Garlan Fair, MD;  Location: [facility];  Service: Endoscopy;  Laterality: N/A;  . FOOT NEUROMA SURGERY Right   . MENISCUS REPAIR Right    for meniscus tear  . NASAL SINUS SURGERY  1993  . OVARIAN CYST REMOVAL     Laparotomy  . PARTIAL COLECTOMY  04/12/2010  . port a cath insertion  2012   for chemo use  . PORT-A-CATH REMOVAL  05/03/2011  . RETINAL TEAR REPAIR CRYOTHERAPY Right   . TOOTH EXTRACTION     molars  . TOTAL ABDOMINAL HYSTERECTOMY W/ BILATERAL SALPINGOOPHORECTOMY  2013  . VARICOSE VEIN SURGERY Bilateral     Social History   Socioeconomic History  . Marital status: Married    Spouse name: Not on file  . Number  of children: 1  . Years of education: Not on file  . Highest education level: Not on file  Occupational History  . Occupation: retired Tour manager  . Financial resource strain: Not on file  . Food insecurity:    Worry: Not on file    Inability: Not on file  . Transportation needs:    Medical: Not on file    Non-medical: Not on file  Tobacco Use  . Smoking status: Former Smoker    Last attempt to quit: 03/13/1971    Years since quitting: 47.2  . Smokeless tobacco: Never Used  . Tobacco comment: high school years- tried and did not take  Substance and Sexual Activity  . Alcohol use: No  . Drug use: No  . Sexual activity: Yes    Birth control/protection: Post-menopausal  Lifestyle  . Physical activity:    Days per week: Not on file    Minutes per session: Not on file  . Stress: Not on file  Relationships  . Social connections:    Talks on phone: Not on file    Gets together: Not on file    Attends religious service: Not on file    Active member of club or organization: Not on file    Attends meetings of clubs or organizations: Not on file    Relationship status: Not on file  . Intimate partner violence:    Fear of current or ex partner: Not on file    Emotionally abused: Not on file    Physically abused: Not on file    Forced sexual activity: Not on file  Other Topics Concern  . Not on file  Social History Narrative  . Not on file    Current Outpatient Medications on File Prior to Visit  Medication Sig Dispense Refill  . Acetylcysteine (NAC 600 PO) Take 600 mg by mouth daily.    . Ascorbic Acid (VITAMIN C) 1000 MG tablet Take 1,000 mg by mouth 2 (two) times daily.     . ASTAXANTHIN PO Take 6 mg by mouth daily.     Marland Kitchen buPROPion (WELLBUTRIN XL) 300 MG 24 hr tablet Take 300 mg by mouth daily before breakfast.     . calcium carbonate (TUMS - DOSED IN MG ELEMENTAL CALCIUM) 500 MG chewable tablet Chew 1 tablet by mouth 3 (three) times daily as needed for  indigestion or heartburn.     . Cholecalciferol (VITAMIN D) 2000 UNITS tablet Take 2,000 Units by mouth 2 (two) times daily.     . Coenzyme Q10 (CO Q 10) 100 MG CAPS Take 100 mg by mouth daily.    . Cyanocobalamin (VITAMIN B-12) 2500 MCG  SUBL Place 2,500 mcg under the tongue daily.     . cycloSPORINE (RESTASIS OP) Apply to eye. Put one drop in both eyes twice a day    . DULoxetine (CYMBALTA) 60 MG capsule Take 1 capsule by mouth every morning.    . fluticasone (FLONASE) 50 MCG/ACT nasal spray Place 1 spray into both nostrils daily.    . Glucosamine-Chondroit-Vit C-Mn (GLUCOSAMINE CHONDR 1500 COMPLX PO) Take 1 capsule by mouth 2 (two) times daily.     Marland Kitchen ibuprofen (ADVIL,MOTRIN) 200 MG tablet Take 200 mg by mouth every 6 (six) hours as needed for moderate pain.     Marland Kitchen levothyroxine (SYNTHROID, LEVOTHROID) 25 MCG tablet Take 25 mcg by mouth daily before breakfast.    . Magnesium 200 MG TABS Take 400 mg by mouth daily.     . Multiple Vitamin (MULTIVITAMIN WITH MINERALS) TABS Take 1 tablet by mouth daily.    Marland Kitchen NATTOKINASE PO Take 1 capsule by mouth daily.    . Probiotic Product (VISBIOME HIGH POTENCY) CAPS Take 1 tablet by mouth daily.    . ranitidine (ZANTAC) 150 MG tablet Take 150 mg by mouth 2 (two) times daily as needed for heartburn.    . tacrolimus (PROTOPIC) 0.1 % ointment Apply 1 application topically at bedtime. Applies for dermatitis.    Marland Kitchen thyroid (NATURE-THROID) 65 MG tablet Take 65 mg by mouth daily.     . Vitamin K, Phytonadione, 100 MCG TABS Take 100 mcg by mouth daily.    . Efinaconazole (JUBLIA) 10 % SOLN Apply 1 application topically daily.    . Methylcellulose, Laxative, (CITRUCEL PO) Take by mouth. Take 2 daily    . metoCLOPramide (REGLAN) 10 MG tablet Take 1 tablet (10 mg total) by mouth once. (Patient taking differently: Take 10 mg by mouth. Reglan 10 mg 30 minutes prior to each bowel prep #2- Take as directed) 1 tablet 0  . metoCLOPramide (REGLAN) 10 MG tablet Take 1 tablet (10  mg total) by mouth as needed for up to 2 days for nausea. 2 tablet no  . Polyethyl Glycol-Propyl Glycol (SYSTANE) 0.4-0.3 % SOLN Apply 1 drop to eye 3 (three) times daily as needed (dry eyes).     Current Facility-Administered Medications on File Prior to Visit  Medication Dose Route Frequency Provider Last Rate Last Dose  . 0.9 %  sodium chloride infusion  500 mL Intravenous Continuous Pyrtle, Lajuan Lines, MD      . 0.9 %  sodium chloride infusion  500 mL Intravenous Once Jerene Bears, MD       EKG 05/16/2018: Normal sinus rhythm at 81 bpm, normal axis, no evidence of ischemia.   CT chest 10/23/11: Bilateral subcentimetret pulmonary nodules, unchanged from PET 04/17/11.   ECG 11/05/11: NSR @ 65/min. Normal intervals. No ischemia. Normal EKG.  Stress EKG 11/08/11: Negative for ischemia. 8 min. 10 METs. 2 mm ST dep back to baseline < 2 min. Dys  Echo 11/08/11: Normal LVEF. Flat coaption of the MV leaflets without obvious prolapse. Trace MR/TR.  Review of Systems  Constitutional: Positive for activity change and fatigue. Negative for diaphoresis and unexpected weight change.  HENT: Negative for congestion.   Eyes: Negative for visual disturbance.  Respiratory: Positive for shortness of breath.   Cardiovascular: Positive for chest pain and palpitations. Negative for leg swelling.  Gastrointestinal: Negative for abdominal pain, nausea and vomiting.  Endocrine: Negative for cold intolerance.  Genitourinary: Negative for dysuria.  Musculoskeletal: Negative for myalgias and neck pain.  Skin: Negative for color change and rash.  Allergic/Immunologic: Negative for immunocompromised state.  Neurological: Negative for dizziness, syncope, light-headedness and headaches.  Hematological: Does not bruise/bleed easily.  Psychiatric/Behavioral: Negative for decreased concentration. The patient is not nervous/anxious.   All other systems reviewed and are negative.      Objective:   Physical Exam Vitals  signs and nursing note reviewed.  Constitutional:      General: She is not in acute distress.    Appearance: Normal appearance. She is well-developed and normal weight. She is not toxic-appearing.  HENT:     Head: Normocephalic and atraumatic.     Nose: No congestion.     Mouth/Throat:     Mouth: Mucous membranes are dry.  Eyes:     Pupils: Pupils are equal, round, and reactive to light.  Neck:     Musculoskeletal: Normal range of motion and neck supple. No muscular tenderness.     Thyroid: No thyromegaly.  Cardiovascular:     Rate and Rhythm: Normal rate and regular rhythm.     Pulses:          Carotid pulses are 2+ on the right side and 2+ on the left side.      Radial pulses are 2+ on the right side and 2+ on the left side.       Dorsalis pedis pulses are 2+ on the right side and 2+ on the left side.       Posterior tibial pulses are 2+ on the right side and 2+ on the left side.  Pulmonary:     Effort: Pulmonary effort is normal.     Breath sounds: Normal breath sounds.  Abdominal:     General: Abdomen is flat. Bowel sounds are normal. There is no distension.     Palpations: Abdomen is soft.  Musculoskeletal: Normal range of motion.        General: No tenderness.  Lymphadenopathy:     Cervical: No cervical adenopathy.  Skin:    General: Skin is warm and dry.  Neurological:     General: No focal deficit present.     Mental Status: She is alert and oriented to person, place, and time.  Psychiatric:        Mood and Affect: Mood normal.         Assessment & Recommendations:  1. Prinzmetal angina ([place]) Diagnosed in 2014. Symptoms have recently increased in frequency. I have sent in prescription for nitroglycerin spray.  2. Decreased exercise tolerance She has noted fatigue at the end of the day after doing her normal activities. She continues to exercise daily. Will obtain echocardiogram to exclude structural abnormalities.  3. Abnormal cardiovascular stress  test Noted to have EKG changes on stress test in 2013. Given worsening symptoms, risk factors, and previous abnormalities on GXT, will schedule for exercise nuclear stress test for further evaluation.  4. Family history of early CAD Significant family history of early CAD.  5. Mixed hyperlipidemia Do not have lipids, will request from PCP office. She did not tolerate lipitor in the past, will try Simvastatin. She is scheduled for physical with PCP in a few weeks, will ask that he hold off on obtaining lipid profile for several weeks to evaluate her response to Simvastatin.  I will see her back after the test for further recommendations and reevaluation.    Thank you for referring the patient to [place]. Please feel free to contact with any questions.   *I have discussed this  case with Dr. Einar Gip and he personally examined the patient and participated in formulating the plan.Jeri Lager, MSN, APRN, FNP-C [facility]. [place] Pager: [phone] Office: [phone] If no answer Cell [phone]

## 2018-05-16 ENCOUNTER — Ambulatory Visit: Payer: Managed Care, Other (non HMO) | Admitting: Cardiology

## 2018-05-16 ENCOUNTER — Encounter: Payer: Self-pay | Admitting: Cardiology

## 2018-05-16 VITALS — BP 119/72 | HR 74 | Ht 66.5 in | Wt 145.9 lb

## 2018-05-16 DIAGNOSIS — R9439 Abnormal result of other cardiovascular function study: Secondary | ICD-10-CM | POA: Diagnosis not present

## 2018-05-16 DIAGNOSIS — R6889 Other general symptoms and signs: Secondary | ICD-10-CM

## 2018-05-16 DIAGNOSIS — E782 Mixed hyperlipidemia: Secondary | ICD-10-CM

## 2018-05-16 DIAGNOSIS — Z8249 Family history of ischemic heart disease and other diseases of the circulatory system: Secondary | ICD-10-CM

## 2018-05-16 DIAGNOSIS — I201 Angina pectoris with documented spasm: Secondary | ICD-10-CM | POA: Diagnosis not present

## 2018-05-16 MED ORDER — SIMVASTATIN 20 MG PO TABS: 20.0000 mg | ORAL_TABLET | Freq: Every day | ORAL | 2 refills | Status: DC

## 2018-05-16 MED ORDER — NITROGLYCERIN 0.4 MG/SPRAY TL SOLN: 1.0000 | 1 refills | Status: DC | PRN

## 2018-05-26 ENCOUNTER — Telehealth: Payer: Self-pay

## 2018-05-26 NOTE — Telephone Encounter (Signed)
Pt called left vm she cant take her statin she has been having side effects and wants to know if she should stop it or can you switch it; Also pt states she has an echo coming up and it says she cant eat anything 6 hours prior and she cant do that because has low blood sugar what should she do ?

## 2018-05-26 NOTE — Telephone Encounter (Signed)
Can stop simvastatin and we can try Livalo 2mg . Have her wait 1-2 weeks before starting Livalo. When here for testing can pick up samples. She does not need to be NPO for echo. I am assuming she is talking about her stress test. Angela Hart was going to discuss with [facility] and they will call her and let her know.

## 2018-05-26 NOTE — Telephone Encounter (Signed)
Left detailed vm °

## 2018-05-30 ENCOUNTER — Other Ambulatory Visit: Payer: Self-pay

## 2018-05-30 ENCOUNTER — Ambulatory Visit: Payer: Managed Care, Other (non HMO)

## 2018-05-30 DIAGNOSIS — R6889 Other general symptoms and signs: Secondary | ICD-10-CM

## 2018-05-30 DIAGNOSIS — I201 Angina pectoris with documented spasm: Secondary | ICD-10-CM | POA: Diagnosis not present

## 2018-06-04 ENCOUNTER — Other Ambulatory Visit: Payer: Self-pay

## 2018-06-06 ENCOUNTER — Other Ambulatory Visit: Payer: Self-pay

## 2018-06-13 ENCOUNTER — Ambulatory Visit: Payer: Self-pay | Admitting: Cardiology

## 2018-07-09 ENCOUNTER — Other Ambulatory Visit: Payer: Self-pay

## 2018-07-14 ENCOUNTER — Other Ambulatory Visit: Payer: Self-pay

## 2018-07-14 ENCOUNTER — Ambulatory Visit (INDEPENDENT_AMBULATORY_CARE_PROVIDER_SITE_OTHER): Payer: Managed Care, Other (non HMO)

## 2018-07-14 DIAGNOSIS — I7300 Raynaud's syndrome without gangrene: Secondary | ICD-10-CM | POA: Insufficient documentation

## 2018-07-14 DIAGNOSIS — R9439 Abnormal result of other cardiovascular function study: Secondary | ICD-10-CM | POA: Diagnosis not present

## 2018-07-14 DIAGNOSIS — I201 Angina pectoris with documented spasm: Secondary | ICD-10-CM | POA: Insufficient documentation

## 2018-07-14 DIAGNOSIS — E782 Mixed hyperlipidemia: Secondary | ICD-10-CM | POA: Insufficient documentation

## 2018-07-14 NOTE — Progress Notes (Signed)
Primary Physician/Referring:  [name], MD  Patient ID: Angela Hart, female    DOB: [birth year], 68 y.o.   MRN: [id number]  Chief complaint: Patient is here to discuss test results.  This visit type was conducted due to national recommendations for restrictions regarding the COVID-19 Pandemic (e.g. social distancing).  This format is felt to be most appropriate for this patient at this time.  All issues noted in this document were discussed and addressed.  No physical exam was performed (except for noted visual exam findings with Telehealth visits).  The patient has consented to conduct a Telehealth visit and understands insurance will be billed.   I discussed the limitations of evaluation and management by telemedicine and the availability of in person appointments. The patient expressed understanding and agreed to proceed.  Virtual Visit via Video Note is as below  I connected with Angela Hart, on 07/16/18 at [facility] by a video enabled telemedicine application and verified that I am speaking with the correct person using two identifiers.     I have discussed with her regarding the safety during COVID Pandemic and steps and precautions including social distancing with the patient.    HPI: Angela Hart  is a 68 y.o. female with Lynch syndrome underwent prophylactic hysterectomy- for cancer (Lynch Syndrome: Colon cancer and Uterus and  less likely other organs). She has had partial colectomy for colon cancer. Also has printzemal angina diagnosed in 2014, hyperlipidemia, and family history of early CAD.   She was told to have Printzemal angina in 2014 and were previously occurring every 5-6 months; however, had recently noticed more frequent episodes. Chest discomfort that radiates up to her jaw and mostly noted during routine activity or rest. She does exercise with walking 2 1/2 miles daily and strength training every other day. Does not occur exercise. She has noticed that she does  have fatigue with previously well tolerated activities. Underwent echo and nuclear stress and presents for f/u.   Since last seen by [place], she has had 1 episode of angina that awoken her from sleep. She states that she should have taken nitroglycerin; however, decided not to. She was started on Simvastatin at her last office visit that she states that she did not tolerate. States that soon after taking the medication, she would not feel well and would have nausea. She has since stopped. She did not tolerate Lipitor in the past either.   Past Medical History:  Diagnosis Date  . Abdominal pain   . Anemia   . Anxiety   . Arthritis    hands/hip, gout left ankle  . Asthma    lungs damaged by chemo - uses inhaler for "chemo toxicity "  . Blood transfusion 03/2010   at [facility]  . Cataract   . Colon cancer ([place]) 03/2010  . Concussion    per pt, has had 2-3 concussions due to horse injuries  . Depression   . Fatigue   . GERD (gastroesophageal reflux disease)    occasional  . Heart murmur    hx -no problems  . Hyperlipidemia   . Interstitial cystitis   . Lactose intolerance   . Lynch syndrome 12/22/2011  . Major depression    clinical- sees leslie brown  . MVP (mitral valve prolapse)    mild-seen on echo 2008  . Neuromuscular disorder ([place])    raynaud's disease hands/feet  . Neuropathy   . Obstructive sleep apnea    very mild  . Osteoarthritis   .  Pelvic fracture ([place])    while riding a horse in 2008  . Peripheral neuropathy    mild- due to chemotherapy  . Raynaud's syndrome    hands/feet  . Serrated adenoma of colon   . SVD (spontaneous vaginal delivery)    x 1  . Thyroid disease     Past Surgical History:  Procedure Laterality Date  . BUNIONECTOMY     Bil/ hammer toe on right foot  . COLONOSCOPY WITH PROPOFOL N/A 06/10/2012   Procedure: COLONOSCOPY WITH PROPOFOL;  Surgeon: Garlan Fair, MD;  Location: [facility];  Service: Endoscopy;  Laterality: N/A;  . COLONOSCOPY WITH  PROPOFOL N/A 09/22/2013   Procedure: COLONOSCOPY WITH PROPOFOL;  Surgeon: Garlan Fair, MD;  Location: [facility];  Service: Endoscopy;  Laterality: N/A;  . COLONOSCOPY WITH PROPOFOL N/A 08/02/2015   Procedure: COLONOSCOPY WITH PROPOFOL;  Surgeon: Garlan Fair, MD;  Location: [facility];  Service: Endoscopy;  Laterality: N/A;  . ESOPHAGOGASTRODUODENOSCOPY (EGD) WITH PROPOFOL N/A 06/10/2012   Procedure: ESOPHAGOGASTRODUODENOSCOPY (EGD) WITH PROPOFOL;  Surgeon: Garlan Fair, MD;  Location: [facility];  Service: Endoscopy;  Laterality: N/A;  . ESOPHAGOGASTRODUODENOSCOPY (EGD) WITH PROPOFOL N/A 09/22/2013   Procedure: ESOPHAGOGASTRODUODENOSCOPY (EGD) WITH PROPOFOL;  Surgeon: Garlan Fair, MD;  Location: [facility];  Service: Endoscopy;  Laterality: N/A;  . FOOT NEUROMA SURGERY Right   . MENISCUS REPAIR Right    for meniscus tear  . NASAL SINUS SURGERY  1993  . OVARIAN CYST REMOVAL     Laparotomy  . PARTIAL COLECTOMY  04/12/2010  . port a cath insertion  2012   for chemo use  . PORT-A-CATH REMOVAL  05/03/2011  . RETINAL TEAR REPAIR CRYOTHERAPY Right   . TOOTH EXTRACTION     molars  . TOTAL ABDOMINAL HYSTERECTOMY W/ BILATERAL SALPINGOOPHORECTOMY  2013  . VARICOSE VEIN SURGERY Bilateral     Social History   Socioeconomic History  . Marital status: Married    Spouse name: Not on file  . Number of children: 1  . Years of education: Not on file  . Highest education level: Not on file  Occupational History  . Occupation: retired Tour manager  . Financial resource strain: Not on file  . Food insecurity:    Worry: Not on file    Inability: Not on file  . Transportation needs:    Medical: Not on file    Non-medical: Not on file  Tobacco Use  . Smoking status: Former Smoker    Last attempt to quit: 03/13/1971    Years since quitting: 47.3  . Smokeless tobacco: Never Used  . Tobacco comment: high school years- tried and did not take  Substance and Sexual  Activity  . Alcohol use: No  . Drug use: No  . Sexual activity: Yes    Birth control/protection: Post-menopausal  Lifestyle  . Physical activity:    Days per week: Not on file    Minutes per session: Not on file  . Stress: Not on file  Relationships  . Social connections:    Talks on phone: Not on file    Gets together: Not on file    Attends religious service: Not on file    Active member of club or organization: Not on file    Attends meetings of clubs or organizations: Not on file    Relationship status: Not on file  . Intimate partner violence:    Fear of current or ex partner: Not on file  Emotionally abused: Not on file    Physically abused: Not on file    Forced sexual activity: Not on file  Other Topics Concern  . Not on file  Social History Narrative  . Not on file    Current Outpatient Medications on File Prior to Visit  Medication Sig Dispense Refill  . Acetylcysteine (NAC 600 PO) Take 600 mg by mouth daily.    . Glucomannan 1000 MG CAPS Take 1 Can by mouth daily.    . Ascorbic Acid (VITAMIN C) 1000 MG tablet Take 1,000 mg by mouth 2 (two) times daily.     . ASTAXANTHIN PO Take 6 mg by mouth daily.     Marland Kitchen buPROPion (WELLBUTRIN XL) 300 MG 24 hr tablet Take 300 mg by mouth daily before breakfast.     . calcium carbonate (TUMS - DOSED IN MG ELEMENTAL CALCIUM) 500 MG chewable tablet Chew 1 tablet by mouth 3 (three) times daily as needed for indigestion or heartburn.     . Cholecalciferol (VITAMIN D) 2000 UNITS tablet Take 2,000 Units by mouth 2 (two) times daily.     . Coenzyme Q10 (CO Q 10) 100 MG CAPS Take 100 mg by mouth daily.    . Cyanocobalamin (VITAMIN B-12) 2500 MCG SUBL Place 2,500 mcg under the tongue daily.     . cycloSPORINE (RESTASIS OP) Apply to eye. Put one drop in both eyes twice a day    . DULoxetine (CYMBALTA) 60 MG capsule Take 1 capsule by mouth every morning.    . fluticasone (FLONASE) 50 MCG/ACT nasal spray Place 1 spray into both nostrils  daily.    . Glucosamine-Chondroit-Vit C-Mn (GLUCOSAMINE CHONDR 1500 COMPLX PO) Take 1 capsule by mouth 2 (two) times daily.     Marland Kitchen ibuprofen (ADVIL,MOTRIN) 200 MG tablet Take 200 mg by mouth every 6 (six) hours as needed for moderate pain.     Marland Kitchen levothyroxine (SYNTHROID, LEVOTHROID) 25 MCG tablet Take 125 mcg by mouth daily before breakfast.    . Magnesium 200 MG TABS Take 400 mg by mouth daily.     . Multiple Vitamin (MULTIVITAMIN WITH MINERALS) TABS Take 1 tablet by mouth daily.    Marland Kitchen NATTOKINASE PO Take 1 capsule by mouth daily.    . nitroGLYCERIN (NITROLINGUAL) 0.4 MG/SPRAY spray Place 1 spray under the tongue every 5 (five) minutes x 3 doses as needed for chest pain. 4.9 g 1  . Polyethyl Glycol-Propyl Glycol (SYSTANE) 0.4-0.3 % SOLN Apply 1 drop to eye 3 (three) times daily as needed (dry eyes).    . Probiotic Product (VISBIOME HIGH POTENCY) CAPS Take 1 tablet by mouth daily.    . tacrolimus (PROTOPIC) 0.1 % ointment Apply 1 application topically at bedtime. Applies for dermatitis.    . Vitamin K, Phytonadione, 100 MCG TABS Take 100 mcg by mouth daily.     Current Facility-Administered Medications on File Prior to Visit  Medication Dose Route Frequency Provider Last Rate Last Dose  . 0.9 %  sodium chloride infusion  500 mL Intravenous Continuous Pyrtle, Lajuan Lines, MD      . 0.9 %  sodium chloride infusion  500 mL Intravenous Once Pyrtle, Lajuan Lines, MD        Review of Systems  Constitution: Positive for malaise/fatigue. Negative for chills, decreased appetite and weight gain.  Cardiovascular: Positive for chest pain. Negative for dyspnea on exertion, leg swelling, palpitations and syncope.  Respiratory: Negative for shortness of breath.   Endocrine: Negative for cold intolerance.  Hematologic/Lymphatic: Does not bruise/bleed easily.  Skin: Negative for color change and nail changes.  Musculoskeletal: Negative for joint swelling.  Gastrointestinal: Negative for abdominal pain, anorexia and  change in bowel habit.  Neurological: Negative for headaches and light-headedness.  Psychiatric/Behavioral: Negative for depression and substance abuse.  All other systems reviewed and are negative.     Objective  Blood pressure 118/90, height 5' 6.5" (1.689 m), weight 141 lb (64 kg). Body mass index is 22.42 kg/m.  Physical Exam  Constitutional: She is oriented to person, place, and time. She appears well-developed and well-nourished. No distress.  Pulmonary/Chest: Effort normal.  Neurological: She is alert and oriented to person, place, and time.  Psychiatric: She has a normal mood and affect. Her behavior is normal.  Nursing note and vitals reviewed.    Laboratory examination:    CMP Latest Ref Rng & Units 12/22/2011 12/12/2011 05/15/2010  Glucose 70 - 99 mg/dL 88 96 82  BUN 6 - 23 mg/dL 8 13 10   Creatinine 0.50 - 1.10 mg/dL 0.67 0.72 0.71  Sodium 135 - 145 mEq/L 138 138 138  Potassium 3.5 - 5.1 mEq/L 3.7 3.8 4.4  Chloride 96 - 112 mEq/L 101 99 101  CO2 19 - 32 mEq/L 28 32 29  Calcium 8.4 - 10.5 mg/dL 8.9 9.6 9.6  Total Protein 6.0 - 8.3 g/dL 5.4(L) 7.0 -  Total Bilirubin 0.3 - 1.2 mg/dL 0.3 0.2(L) -  Alkaline Phos 39 - 117 U/L 67 82 -  AST 0 - 37 U/L 20 30 -  ALT 0 - 35 U/L 17 28 -   CBC Latest Ref Rng & Units 12/22/2011 12/12/2011 05/15/2010  WBC 4.0 - 10.5 K/uL 7.7 5.1 5.1  Hemoglobin 12.0 - 15.0 g/dL 10.9(L) 12.2 10.7(L)  Hematocrit 36.0 - 46.0 % 32.6(L) 37.4 34.4(L)  Platelets 150 - 400 K/uL 276 318 384    Cardiac Studies:   Exercise Sestamibi Stress Test 07/14/2018 1. The resting electrocardiogram demonstrated normal sinus rhythm, normal resting conduction and normal rest repolarization.  The stress electrocardiogram was positive for ischemia with 2 mm horizontal to up sloping ST depression at peak exercise persisting for >3 min into recovery. No arrhythmias. Normal BP response. Patient exercised on Bruce protocol for 8:29 minutes and achieved 10.16 METS. Stress test  terminated due to dyspnea and 88% MPHR achieved (Target HR >85%).  2. Myocardial perfusion imaging is normal. Overall left ventricular systolic function was normal without regional wall motion abnormalities. The left ventricular ejection fraction was 73%.   3.  This is a low risk study, clinical correlation recommended.  Echocardiogram 05/30/2018: Left ventricle cavity is normal in size. Normal global wall motion. Doppler evidence of grade I (impaired) diastolic dysfunction, normal LAP. Calculated EF 55%. Mild (Grade I) mitral regurgitation. No evidence of pulmonary hypertension.   Stress EKG 11/08/11: Negative for ischemia. 8 min. 10 METs. 2 mm ST dep back to baseline < 2 min. Dyspnea present.    Assessment   Prinzmetal angina (HCC)  Raynaud's disease without gangrene  Mixed hyperlipidemia  EKG 05/16/2018: Normal sinus rhythm at 81 bpm, normal axis, no evidence of ischemia.   Recommendations:   I discussed recently obtained echocardiogram and exercise nuclear stress test with the patient.  Echocardiogram was essentially normal, except for grade 1 diastolic dysfunction.  Normal LVEF.  Fortunately her symptoms of angina have been stable and slightly improved since last seen by [place].  Perfusion imaging was without abnormalities; however, was noted to have concerning EKG changes.  EKG changes were also previously noted on stress test in 2013.  She likely has endothelial dysfunction.  As her symptoms have been overall stable for now, would recommend continued as needed nitroglycerin.  Could consider addition of daily Imdur if needed.  If she continues to have symptoms, could proceed with coronary CTA or coronary angiogram for further evaluation.  She does have risk factors with uncontrolled hyperlipidemia, raynaud's, and family history of early CAD.  She will need aggressive risk factor modifications with controlling her hyperlipidemia.  She has not tolerated Lipitor in the past.  I have discussed  options of trying simvastatin a few days a week in addition with Zetia or Livalo.  She does not wish to go back on simvastatin due to side effects, but is willing to try Livalo.  Samples will be provided, but will also send prescription to evaluate coverage by insurance.  Will have baseline lipid panel performed to establish baseline as most recent lipids were performed in 2017.  We will schedule for follow-up visit in 4 to 6 weeks for close monitoring of her symptoms, but encouraged sooner follow-up for any worsening symptoms or concerns.   *I have discussed this case with Dr. Einar Gip and he participated in formulating the plan.*   Miquel Dunn, MSN, APRN, FNP-C [facility]. [place] Office: [phone] Fax: [phone]

## 2018-07-16 ENCOUNTER — Encounter: Payer: Self-pay | Admitting: Cardiology

## 2018-07-16 ENCOUNTER — Other Ambulatory Visit: Payer: Self-pay

## 2018-07-16 ENCOUNTER — Ambulatory Visit (INDEPENDENT_AMBULATORY_CARE_PROVIDER_SITE_OTHER): Payer: Managed Care, Other (non HMO) | Admitting: Cardiology

## 2018-07-16 VITALS — BP 118/90 | Ht 66.5 in | Wt 141.0 lb

## 2018-07-16 DIAGNOSIS — I7300 Raynaud's syndrome without gangrene: Secondary | ICD-10-CM

## 2018-07-16 DIAGNOSIS — I201 Angina pectoris with documented spasm: Secondary | ICD-10-CM

## 2018-07-16 DIAGNOSIS — E782 Mixed hyperlipidemia: Secondary | ICD-10-CM | POA: Diagnosis not present

## 2018-07-16 DIAGNOSIS — R5383 Other fatigue: Secondary | ICD-10-CM | POA: Diagnosis not present

## 2018-07-17 ENCOUNTER — Encounter: Payer: Self-pay | Admitting: Cardiology

## 2018-07-17 MED ORDER — PITAVASTATIN CALCIUM 2 MG PO TABS: 2.0000 mg | ORAL_TABLET | Freq: Once | ORAL | 1 refills | Status: DC

## 2018-07-22 LAB — LIPID PANEL
Chol/HDL Ratio: 2.6 ratio (ref 0.0–4.4)
Cholesterol, Total: 209 mg/dL — ABNORMAL HIGH (ref 100–199)
HDL: 81 mg/dL (ref >39)
LDL Calculated: 114 mg/dL — ABNORMAL HIGH (ref 0–99)
Triglycerides: 69 mg/dL (ref 0–149)
VLDL Cholesterol Cal: 14 mg/dL (ref 5–40)

## 2018-07-28 ENCOUNTER — Encounter: Payer: Self-pay | Admitting: Internal Medicine

## 2018-08-09 ENCOUNTER — Other Ambulatory Visit: Payer: Self-pay | Admitting: Cardiology

## 2018-08-11 NOTE — Telephone Encounter (Signed)
Please fill

## 2018-08-12 ENCOUNTER — Ambulatory Visit: Payer: Managed Care, Other (non HMO) | Admitting: Cardiology

## 2018-08-12 ENCOUNTER — Other Ambulatory Visit: Payer: Self-pay

## 2018-08-12 ENCOUNTER — Ambulatory Visit (INDEPENDENT_AMBULATORY_CARE_PROVIDER_SITE_OTHER): Payer: Managed Care, Other (non HMO) | Admitting: Cardiology

## 2018-08-12 ENCOUNTER — Encounter: Payer: Self-pay | Admitting: Cardiology

## 2018-08-12 VITALS — Ht 66.0 in | Wt 140.0 lb

## 2018-08-12 DIAGNOSIS — E782 Mixed hyperlipidemia: Secondary | ICD-10-CM | POA: Diagnosis not present

## 2018-08-12 DIAGNOSIS — I7300 Raynaud's syndrome without gangrene: Secondary | ICD-10-CM

## 2018-08-12 DIAGNOSIS — I201 Angina pectoris with documented spasm: Secondary | ICD-10-CM | POA: Diagnosis not present

## 2018-08-12 NOTE — Progress Notes (Signed)
Primary Physician/Referring:  [name], MD  Patient ID: Angela Hart, female    DOB: April 22, 1950, 68 y.o.   MRN: [id number]  Chief complaint: Patient is here to discuss test results.  HPI: Angela Hart  is a 68 y.o. female with Lynch syndrome underwent prophylactic hysterectomy- for cancer (Lynch Syndrome: Colon cancer and Uterus and  less likely other organs). She has had partial colectomy for colon cancer. Also has printzemal angina diagnosed in 2014, hyperlipidemia, and family history of early CAD.   She was told to have Printzemal angina in 2014 and were previously occurring every 5-6 months; however, had recently noticed more frequent episodes. Chest discomfort that radiates up to her jaw and mostly noted during routine activity or rest. She does exercise with walking 2 1/2 miles daily and strength training every other day. Does not occur exercise. She has noticed that she does have fatigue with previously well tolerated activities. Underwent echo and nuclear stress and presents for f/u.   Since last seen by [place], she has had 1 episode of angina that awoken her from sleep. She states that she should have taken nitroglycerin; however, decided not to. She was started on Simvastatin at her last office visit that she states that she did not tolerate. States that soon after taking the medication, she would not feel well and would have nausea. She has since stopped. She did not tolerate Lipitor in the past either.   Past Medical History:  Diagnosis Date  . Abdominal pain   . Allergy   . Anemia   . Anxiety   . Arthritis    hands/hip, gout left ankle  . Asthma    lungs damaged by chemo - uses inhaler for "chemo toxicity "  . Blood transfusion 03/2010   at [facility]  . Cataract   . Colon cancer ([place]) 03/2010  . Concussion    per pt, has had 2-3 concussions due to horse injuries  . Depression   . Fatigue   . GERD (gastroesophageal reflux disease)    occasional  . Heart murmur    hx  -no problems  . Hyperlipidemia   . Interstitial cystitis   . Lactose intolerance   . Lynch syndrome 12/22/2011  . Major depression    clinical- sees leslie brown  . MVP (mitral valve prolapse)    mild-seen on echo 2008  . Neuromuscular disorder ([place])    raynaud's disease hands/feet  . Neuropathy   . Obstructive sleep apnea    very mild  . Osteoarthritis   . Osteoporosis   . Pelvic fracture ([place])    while riding a horse in 2008  . Peripheral neuropathy    mild- due to chemotherapy  . Prinzmetal angina ([place])   . Raynaud's syndrome    hands/feet  . Serrated adenoma of colon   . SVD (spontaneous vaginal delivery)    x 1  . Thyroid disease     Past Surgical History:  Procedure Laterality Date  . BUNIONECTOMY     Bil/ hammer toe on right foot  . COLONOSCOPY    . COLONOSCOPY WITH PROPOFOL N/A 06/10/2012   Procedure: COLONOSCOPY WITH PROPOFOL;  Surgeon: Garlan Fair, MD;  Location: [facility];  Service: Endoscopy;  Laterality: N/A;  . COLONOSCOPY WITH PROPOFOL N/A 09/22/2013   Procedure: COLONOSCOPY WITH PROPOFOL;  Surgeon: Garlan Fair, MD;  Location: [facility];  Service: Endoscopy;  Laterality: N/A;  . COLONOSCOPY WITH PROPOFOL N/A 08/02/2015   Procedure: COLONOSCOPY WITH  PROPOFOL;  Surgeon: Garlan Fair, MD;  Location: [facility];  Service: Endoscopy;  Laterality: N/A;  . ESOPHAGOGASTRODUODENOSCOPY (EGD) WITH PROPOFOL N/A 06/10/2012   Procedure: ESOPHAGOGASTRODUODENOSCOPY (EGD) WITH PROPOFOL;  Surgeon: Garlan Fair, MD;  Location: [facility];  Service: Endoscopy;  Laterality: N/A;  . ESOPHAGOGASTRODUODENOSCOPY (EGD) WITH PROPOFOL N/A 09/22/2013   Procedure: ESOPHAGOGASTRODUODENOSCOPY (EGD) WITH PROPOFOL;  Surgeon: Garlan Fair, MD;  Location: [facility];  Service: Endoscopy;  Laterality: N/A;  . FOOT NEUROMA SURGERY Right   . MENISCUS REPAIR Right    for meniscus tear  . NASAL SINUS SURGERY  1993  . OVARIAN CYST REMOVAL     Laparotomy  . PARTIAL  COLECTOMY  04/12/2010  . POLYPECTOMY    . port a cath insertion  2012   for chemo use  . PORT-A-CATH REMOVAL  05/03/2011  . RETINAL TEAR REPAIR CRYOTHERAPY Right   . TOOTH EXTRACTION     molars  . TOTAL ABDOMINAL HYSTERECTOMY W/ BILATERAL SALPINGOOPHORECTOMY  2013  . UPPER GASTROINTESTINAL ENDOSCOPY    . VARICOSE VEIN SURGERY Bilateral     Social History   Socioeconomic History  . Marital status: Married    Spouse name: Not on file  . Number of children: 1  . Years of education: Not on file  . Highest education level: Not on file  Occupational History  . Occupation: retired Tour manager  . Financial resource strain: Not on file  . Food insecurity    Worry: Not on file    Inability: Not on file  . Transportation needs    Medical: Not on file    Non-medical: Not on file  Tobacco Use  . Smoking status: Never Smoker  . Smokeless tobacco: Never Used  . Tobacco comment: high school years 60 - tried  to smoke and did not take or like it   Substance and Sexual Activity  . Alcohol use: No  . Drug use: No  . Sexual activity: Yes    Birth control/protection: Post-menopausal  Lifestyle  . Physical activity    Days per week: Not on file    Minutes per session: Not on file  . Stress: Not on file  Relationships  . Social Herbalist on phone: Not on file    Gets together: Not on file    Attends religious service: Not on file    Active member of club or organization: Not on file    Attends meetings of clubs or organizations: Not on file    Relationship status: Not on file  . Intimate partner violence    Fear of current or ex partner: Not on file    Emotionally abused: Not on file    Physically abused: Not on file    Forced sexual activity: Not on file  Other Topics Concern  . Not on file  Social History Narrative  . Not on file    Current Outpatient Medications on File Prior to Visit  Medication Sig Dispense Refill  . Acetylcysteine (NAC 600 PO) Take  600 mg by mouth daily.    . Ascorbic Acid (VITAMIN C) 1000 MG tablet Take 1,000 mg by mouth 2 (two) times daily.     . ASTAXANTHIN PO Take 6 mg by mouth daily.     Marland Kitchen buPROPion (WELLBUTRIN XL) 300 MG 24 hr tablet Take 300 mg by mouth daily before breakfast.     . calcium carbonate (TUMS - DOSED IN MG ELEMENTAL CALCIUM) 500 MG  chewable tablet Chew 1 tablet by mouth 3 (three) times daily as needed for indigestion or heartburn.     . Cholecalciferol (VITAMIN D) 2000 UNITS tablet Take 2,000 Units by mouth 2 (two) times a day.     . Coenzyme Q10 (CO Q 10) 100 MG CAPS Take 100 mg by mouth daily.    . Cyanocobalamin (VITAMIN B-12) 2500 MCG SUBL Place 2,500 mcg under the tongue daily.     . cycloSPORINE (RESTASIS OP) Apply to eye. Put one drop in both eyes twice a day    . DULoxetine (CYMBALTA) 60 MG capsule Take 1 capsule by mouth every morning.    . fluticasone (FLONASE) 50 MCG/ACT nasal spray Place 1 spray into both nostrils daily.    . Glucomannan 1000 MG CAPS Take 1 Can by mouth daily.    . Glucosamine-Chondroit-Vit C-Mn (GLUCOSAMINE CHONDR 1500 COMPLX PO) Take 1 capsule by mouth 2 (two) times daily.     Marland Kitchen ibuprofen (ADVIL,MOTRIN) 200 MG tablet Take 200 mg by mouth every 6 (six) hours as needed for moderate pain.     Marland Kitchen levothyroxine (SYNTHROID, LEVOTHROID) 25 MCG tablet Take 125 mcg by mouth daily before breakfast.    . Magnesium 200 MG TABS Take 400 mg by mouth daily.     . Multiple Vitamin (MULTIVITAMIN WITH MINERALS) TABS Take 1 tablet by mouth daily.    Marland Kitchen NATTOKINASE PO Take 1 capsule by mouth daily.    . nitroGLYCERIN (NITROLINGUAL) 0.4 MG/SPRAY spray Place 1 spray under the tongue every 5 (five) minutes x 3 doses as needed for chest pain. 4.9 g 1  . Polyethyl Glycol-Propyl Glycol (SYSTANE) 0.4-0.3 % SOLN Apply 1 drop to eye 3 (three) times daily as needed (dry eyes).    . Probiotic Product (VISBIOME HIGH POTENCY) CAPS Take 1 tablet by mouth daily.    . tacrolimus (PROTOPIC) 0.1 % ointment  Apply 1 application topically at bedtime. Applies for dermatitis.    . Vitamin K, Phytonadione, 100 MCG TABS Take 100 mcg by mouth daily.     No current facility-administered medications on file prior to visit.     Review of Systems  Constitution: Positive for malaise/fatigue. Negative for chills, decreased appetite and weight gain.  Cardiovascular: Negative for chest pain, dyspnea on exertion, leg swelling, palpitations and syncope.  Respiratory: Negative for shortness of breath.   Endocrine: Negative for cold intolerance.  Hematologic/Lymphatic: Does not bruise/bleed easily.  Skin: Negative for color change and nail changes.  Musculoskeletal: Negative for joint swelling.  Gastrointestinal: Negative for abdominal pain, anorexia and change in bowel habit.  Neurological: Negative for headaches and light-headedness.  Psychiatric/Behavioral: Negative for depression and substance abuse.  All other systems reviewed and are negative.     Objective  Height 5\' 6"  (1.676 m), weight 140 lb (63.5 kg). Body mass index is 22.6 kg/m.   Physical Exam  Constitutional: She is oriented to person, place, and time. She appears well-developed and well-nourished. No distress.  Pulmonary/Chest: Effort normal.  Neurological: She is alert and oriented to person, place, and time.  Psychiatric: She has a normal mood and affect. Her behavior is normal.  Nursing note and vitals reviewed.  Laboratory examination:    CMP Latest Ref Rng & Units 12/22/2011 12/12/2011 05/15/2010  Glucose 70 - 99 mg/dL 88 96 82  BUN 6 - 23 mg/dL 8 13 10   Creatinine 0.50 - 1.10 mg/dL 0.67 0.72 0.71  Sodium 135 - 145 mEq/L 138 138 138  Potassium 3.5 -  5.1 mEq/L 3.7 3.8 4.4  Chloride 96 - 112 mEq/L 101 99 101  CO2 19 - 32 mEq/L 28 32 29  Calcium 8.4 - 10.5 mg/dL 8.9 9.6 9.6  Total Protein 6.0 - 8.3 g/dL 5.4(L) 7.0 -  Total Bilirubin 0.3 - 1.2 mg/dL 0.3 0.2(L) -  Alkaline Phos 39 - 117 U/L 67 82 -  AST 0 - 37 U/L 20 30 -  ALT 0  - 35 U/L 17 28 -   CBC Latest Ref Rng & Units 12/22/2011 12/12/2011 05/15/2010  WBC 4.0 - 10.5 K/uL 7.7 5.1 5.1  Hemoglobin 12.0 - 15.0 g/dL 10.9(L) 12.2 10.7(L)  Hematocrit 36.0 - 46.0 % 32.6(L) 37.4 34.4(L)  Platelets 150 - 400 K/uL 276 318 384    Cardiac Studies:   Exercise Sestamibi Stress Test 07/14/2018 1. The resting electrocardiogram demonstrated normal sinus rhythm, normal resting conduction and normal rest repolarization.  The stress electrocardiogram was positive for ischemia with 2 mm horizontal to up sloping ST depression at peak exercise persisting for >3 min into recovery. No arrhythmias. Normal BP response. Patient exercised on Bruce protocol for 8:29 minutes and achieved 10.16 METS. Stress test terminated due to dyspnea and 88% MPHR achieved (Target HR >85%).  2. Myocardial perfusion imaging is normal. Overall left ventricular systolic function was normal without regional wall motion abnormalities. The left ventricular ejection fraction was 73%.   3.  This is a low risk study, clinical correlation recommended.  Echocardiogram 05/30/2018: Left ventricle cavity is normal in size. Normal global wall motion. Doppler evidence of grade I (impaired) diastolic dysfunction, normal LAP. Calculated EF 55%. Mild (Grade I) mitral regurgitation. No evidence of pulmonary hypertension.   Stress EKG 11/08/11: Negative for ischemia. 8 min. 10 METs. 2 mm ST dep back to baseline < 2 min. Dyspnea present.  Assessment   1. Prinzmetal angina ([place])   2. Raynaud's disease without gangrene   3. Mixed hyperlipidemia    EKG 05/16/2018: Normal sinus rhythm at 81 bpm, normal axis, no evidence of ischemia.   Recommendations:   I discussed recently obtained echocardiogram and exercise nuclear stress test with the patient.  Echocardiogram was essentially normal, except for grade 1 diastolic dysfunction.  Normal LVEF.  Fortunately her symptoms of angina have been stable and slightly improved since last  seen by [place].  Perfusion imaging was without abnormalities; however, was noted to have concerning EKG changes.  EKG changes were also previously noted on stress test in 2013.  She likely has endothelial dysfunction.  As her symptoms have been overall stable for now, would recommend continued as needed nitroglycerin.    Could consider addition of daily Imdur if needed which will also help with Raynaud's disease as well. .  If she continues to have symptoms, could proceed with coronary CTA or coronary angiogram for further evaluation.  She does have risk factors with uncontrolled hyperlipidemia, raynaud's, and family history of early CAD.  She will need aggressive risk factor modifications with controlling her hyperlipidemia.  She has not tolerated Lipitor in the past.  Samples of Livalo given.  I will see her back in 4-6 weeks.   Adrian Prows, MD, [facility] 09/21/2018, 2:01 PM [facility]. [place] Pager: [phone] Office: [phone] If no answer Cell [phone]

## 2018-08-13 ENCOUNTER — Encounter: Payer: Self-pay | Admitting: Internal Medicine

## 2018-08-18 ENCOUNTER — Other Ambulatory Visit: Payer: Self-pay

## 2018-08-18 ENCOUNTER — Other Ambulatory Visit: Payer: Self-pay | Admitting: *Deleted

## 2018-08-18 ENCOUNTER — Ambulatory Visit: Payer: Self-pay | Admitting: *Deleted

## 2018-08-18 ENCOUNTER — Telehealth: Payer: Self-pay | Admitting: *Deleted

## 2018-08-18 VITALS — Ht 66.5 in | Wt 140.0 lb

## 2018-08-18 DIAGNOSIS — Z85038 Personal history of other malignant neoplasm of large intestine: Secondary | ICD-10-CM

## 2018-08-18 DIAGNOSIS — Z86010 Personal history of colonic polyps: Secondary | ICD-10-CM

## 2018-08-18 MED ORDER — NA SULFATE-K SULFATE-MG SULF 17.5-3.13-1.6 GM/177ML PO SOLN: 1.0000 | Freq: Once | ORAL | 0 refills | Status: DC

## 2018-08-18 MED ORDER — PEG-KCL-NACL-NASULF-NA ASC-C 140 G PO SOLR: 1.0000 | ORAL | 0 refills | Status: DC

## 2018-08-18 MED ORDER — METOCLOPRAMIDE HCL 10 MG PO TABS: 10.0000 mg | ORAL_TABLET | ORAL | 0 refills | Status: DC

## 2018-08-18 NOTE — Telephone Encounter (Signed)
Pt aware- Script to pharmacy - Angela Hart pV

## 2018-08-18 NOTE — Progress Notes (Signed)
Pt states the 2nd week in March she had COVID 19- she did NOT have a Covid 19 test at that time- she had cough,  No taste, diarrhea, muscle weakness, excessive vertigo,red eyes,  no fever- she will see her PCP this week, [facility]  and he will test her - pt instructed to call with results once she receives instructions- pt has NO symptoms   No egg or soy allergy known to patient  No issues with past sedation with any surgeries  or procedures, no intubation problems  No diet pills per patient No home 02 use per patient  No blood thinners per patient  Pt denies issues with constipation  No A fib or A flutter  EMMI video sent to pt's e mail    Pt verified name, DOB, address and insurance during PV today. Pt mailed instruction packet to included paper to complete and mail back to [facility] with addressed and stamped envelope, Emmi video, copy of consent form to read and not return, and instructions. Suprep $15  coupon mailed in packet. PV completed over the phone. Pt encouraged to call with questions or issues    Pt is aware that care partner will wait in the car during parking lot; if they feel like they will be too hot to wait in the car; they may wait in the lobby.  We want them to wear a mask (we do not have any that we can provide them), practice social distancing, and we will check their temperatures when they get here.  I did remind patient that their care partner needs to stay in the parking lot the entire time. Pt will wear mask into building  TE to JMP asking for Reglan for pt prior to prep

## 2018-08-18 NOTE — Telephone Encounter (Signed)
Pt states she had Reglan last year prior to her colon for her prep . CAn she have the Reglan again.      Please advise, Angela Hart

## 2018-08-18 NOTE — Telephone Encounter (Signed)
Yes, okay for reglan 10 mg 1 hour before each 1/2 of bowel prep

## 2018-08-20 ENCOUNTER — Telehealth: Payer: Self-pay | Admitting: Internal Medicine

## 2018-08-20 NOTE — Telephone Encounter (Signed)
Patient called in requesting to speak with someone about the prep solution that she has to pick up.

## 2018-08-20 NOTE — Telephone Encounter (Signed)
Patient states she cannot use the Plenvu coupon due to being 68 yrs old. plenvu cost $150. Sample of Plenvu (lot [phone], 12/2018) at 3rd floor front desk for patient-pt is aware.

## 2018-08-22 ENCOUNTER — Ambulatory Visit (INDEPENDENT_AMBULATORY_CARE_PROVIDER_SITE_OTHER): Payer: Managed Care, Other (non HMO) | Admitting: Cardiology

## 2018-08-22 ENCOUNTER — Other Ambulatory Visit: Payer: Self-pay

## 2018-08-22 ENCOUNTER — Encounter: Payer: Self-pay | Admitting: Cardiology

## 2018-08-22 VITALS — BP 125/70 | Ht 66.0 in | Wt 140.0 lb

## 2018-08-22 DIAGNOSIS — I201 Angina pectoris with documented spasm: Secondary | ICD-10-CM | POA: Diagnosis not present

## 2018-08-22 DIAGNOSIS — R6889 Other general symptoms and signs: Secondary | ICD-10-CM | POA: Diagnosis not present

## 2018-08-22 DIAGNOSIS — I7300 Raynaud's syndrome without gangrene: Secondary | ICD-10-CM | POA: Diagnosis not present

## 2018-08-22 DIAGNOSIS — E7800 Pure hypercholesterolemia, unspecified: Secondary | ICD-10-CM

## 2018-08-22 NOTE — Progress Notes (Signed)
Virtual Visit via Video Note: This visit type was conducted due to national recommendations for restrictions regarding the COVID-19 Pandemic (e.g. social distancing).  This format is felt to be most appropriate for this patient at this time.  All issues noted in this document were discussed and addressed.  No physical exam was performed (except for noted visual exam findings with Telehealth visits).  The patient has consented to conduct a Telehealth visit and understands insurance will be billed.   I connected with@, on 08/23/18 at  by a video enabled telemedicine application and verified that I am speaking with the correct person using two identifiers.   I discussed the limitations of evaluation and management by telemedicine and the availability of in person appointments. The patient expressed understanding and agreed to proceed.   I have discussed with patient regarding the safety during COVID Pandemic and steps and precautions to be taken including social distancing, frequent hand wash and use of detergent soap, gels with the patient. I asked the patient to avoid touching mouth, nose, eyes, ears with the hands. I encouraged regular walking around the neighborhood and exercise and regular diet, as long as social distancing can be maintained.  Primary Physician/Referring:  [name], MD  Patient ID: Angela Hart, female    DOB: April 03, 1950, 68 y.o.   MRN: [id number]  Chief complaint: Patient is here to discuss test results.  HPI: Angela Hart  is a 68 y.o. female with Lynch syndrome underwent prophylactic hysterectomy- for cancer (Lynch Syndrome: Colon cancer and Uterus and  less likely other organs). She has had partial colectomy for colon cancer. Also has printzemal angina diagnosed in 2014, hyperlipidemia, and family history of early CAD.   She was told to have Printzemal angina in 2014 and were previously occurring every 5-6 months; Chest discomfort that radiates up to her jaw and  mostly noted during routine activity or rest.   Due to recent increase in her anginal symptoms, underwent nuclear stress test and echocardiogram to evaluate both dyspnea and anginal symptoms, now this is a virtual visit.  Past Medical History:  Diagnosis Date  . Abdominal pain   . Allergy   . Anemia   . Anxiety   . Arthritis    hands/hip, gout left ankle  . Asthma    lungs damaged by chemo - uses inhaler for "chemo toxicity "  . Blood transfusion 03/2010   at [facility]  . Cataract   . Colon cancer ([place]) 03/2010  . Concussion    per pt, has had 2-3 concussions due to horse injuries  . Depression   . Fatigue   . GERD (gastroesophageal reflux disease)    occasional  . Heart murmur    hx -no problems  . Hyperlipidemia   . Interstitial cystitis   . Lactose intolerance   . Lynch syndrome 12/22/2011  . Major depression    clinical- sees leslie brown  . MVP (mitral valve prolapse)    mild-seen on echo 2008  . Neuromuscular disorder ([place])    raynaud's disease hands/feet  . Neuropathy   . Obstructive sleep apnea    very mild  . Osteoarthritis   . Osteoporosis   . Pelvic fracture ([place])    while riding a horse in 2008  . Peripheral neuropathy    mild- due to chemotherapy  . Prinzmetal angina ([place])   . Raynaud's syndrome    hands/feet  . Serrated adenoma of colon   . SVD (spontaneous vaginal delivery)  x 1  . Thyroid disease     Past Surgical History:  Procedure Laterality Date  . BUNIONECTOMY     Bil/ hammer toe on right foot  . COLONOSCOPY    . COLONOSCOPY WITH PROPOFOL N/A 06/10/2012   Procedure: COLONOSCOPY WITH PROPOFOL;  Surgeon: Garlan Fair, MD;  Location: [facility];  Service: Endoscopy;  Laterality: N/A;  . COLONOSCOPY WITH PROPOFOL N/A 09/22/2013   Procedure: COLONOSCOPY WITH PROPOFOL;  Surgeon: Garlan Fair, MD;  Location: [facility];  Service: Endoscopy;  Laterality: N/A;  . COLONOSCOPY WITH PROPOFOL N/A 08/02/2015   Procedure: COLONOSCOPY WITH  PROPOFOL;  Surgeon: Garlan Fair, MD;  Location: [facility];  Service: Endoscopy;  Laterality: N/A;  . ESOPHAGOGASTRODUODENOSCOPY (EGD) WITH PROPOFOL N/A 06/10/2012   Procedure: ESOPHAGOGASTRODUODENOSCOPY (EGD) WITH PROPOFOL;  Surgeon: Garlan Fair, MD;  Location: [facility];  Service: Endoscopy;  Laterality: N/A;  . ESOPHAGOGASTRODUODENOSCOPY (EGD) WITH PROPOFOL N/A 09/22/2013   Procedure: ESOPHAGOGASTRODUODENOSCOPY (EGD) WITH PROPOFOL;  Surgeon: Garlan Fair, MD;  Location: [facility];  Service: Endoscopy;  Laterality: N/A;  . FOOT NEUROMA SURGERY Right   . MENISCUS REPAIR Right    for meniscus tear  . NASAL SINUS SURGERY  1993  . OVARIAN CYST REMOVAL     Laparotomy  . PARTIAL COLECTOMY  04/12/2010  . POLYPECTOMY    . port a cath insertion  2012   for chemo use  . PORT-A-CATH REMOVAL  05/03/2011  . RETINAL TEAR REPAIR CRYOTHERAPY Right   . TOOTH EXTRACTION     molars  . TOTAL ABDOMINAL HYSTERECTOMY W/ BILATERAL SALPINGOOPHORECTOMY  2013  . UPPER GASTROINTESTINAL ENDOSCOPY    . VARICOSE VEIN SURGERY Bilateral     Social History   Socioeconomic History  . Marital status: Married    Spouse name: Not on file  . Number of children: 1  . Years of education: Not on file  . Highest education level: Not on file  Occupational History  . Occupation: retired Tour manager  . Financial resource strain: Not on file  . Food insecurity    Worry: Not on file    Inability: Not on file  . Transportation needs    Medical: Not on file    Non-medical: Not on file  Tobacco Use  . Smoking status: Never Smoker  . Smokeless tobacco: Never Used  . Tobacco comment: high school years 15 - tried  to smoke and did not take or like it   Substance and Sexual Activity  . Alcohol use: No  . Drug use: No  . Sexual activity: Yes    Birth control/protection: Post-menopausal  Lifestyle  . Physical activity    Days per week: Not on file    Minutes per session: Not on file  .  Stress: Not on file  Relationships  . Social Herbalist on phone: Not on file    Gets together: Not on file    Attends religious service: Not on file    Active member of club or organization: Not on file    Attends meetings of clubs or organizations: Not on file    Relationship status: Not on file  . Intimate partner violence    Fear of current or ex partner: Not on file    Emotionally abused: Not on file    Physically abused: Not on file    Forced sexual activity: Not on file  Other Topics Concern  . Not on file  Social  History Narrative  . Not on file    Current Outpatient Medications on File Prior to Visit  Medication Sig Dispense Refill  . Acetylcysteine (NAC 600 PO) Take 600 mg by mouth daily.    . Ascorbic Acid (VITAMIN C) 1000 MG tablet Take 1,000 mg by mouth 2 (two) times daily.     . ASTAXANTHIN PO Take 6 mg by mouth daily.     Marland Kitchen buPROPion (WELLBUTRIN XL) 300 MG 24 hr tablet Take 300 mg by mouth daily before breakfast.     . calcium carbonate (TUMS - DOSED IN MG ELEMENTAL CALCIUM) 500 MG chewable tablet Chew 1 tablet by mouth 3 (three) times daily as needed for indigestion or heartburn.     . Cholecalciferol (VITAMIN D) 2000 UNITS tablet Take 2,000 Units by mouth 2 (two) times a day.     . Coenzyme Q10 (CO Q 10) 100 MG CAPS Take 100 mg by mouth daily.    . Cyanocobalamin (VITAMIN B-12) 2500 MCG SUBL Place 2,500 mcg under the tongue daily.     . cycloSPORINE (RESTASIS OP) Apply to eye. Put one drop in both eyes twice a day    . DULoxetine (CYMBALTA) 60 MG capsule Take 1 capsule by mouth every morning.    . fluticasone (FLONASE) 50 MCG/ACT nasal spray Place 1 spray into both nostrils daily.    . Glucomannan 1000 MG CAPS Take 1 Can by mouth daily.    . Glucosamine-Chondroit-Vit C-Mn (GLUCOSAMINE CHONDR 1500 COMPLX PO) Take 1 capsule by mouth 2 (two) times daily.     Marland Kitchen ibuprofen (ADVIL,MOTRIN) 200 MG tablet Take 200 mg by mouth every 6 (six) hours as needed for  moderate pain.     Marland Kitchen levothyroxine (SYNTHROID, LEVOTHROID) 25 MCG tablet Take 125 mcg by mouth daily before breakfast.    . Magnesium 200 MG TABS Take 400 mg by mouth daily.     . metoCLOPramide (REGLAN) 10 MG tablet Take 1 tablet (10 mg total) by mouth as directed. Take 1 hour prior to each prep dose 2 tablet 0  . Multiple Vitamin (MULTIVITAMIN WITH MINERALS) TABS Take 1 tablet by mouth daily.    Marland Kitchen NATTOKINASE PO Take 1 capsule by mouth daily.    . nitroGLYCERIN (NITROLINGUAL) 0.4 MG/SPRAY spray Place 1 spray under the tongue every 5 (five) minutes x 3 doses as needed for chest pain. 4.9 g 1  . PEG-KCl-NaCl-NaSulf-Na Asc-C (PLENVU) 140 g SOLR Take 1 kit by mouth as directed. 1 each 0  . Polyethyl Glycol-Propyl Glycol (SYSTANE) 0.4-0.3 % SOLN Apply 1 drop to eye 3 (three) times daily as needed (dry eyes).    . Probiotic Product (VISBIOME HIGH POTENCY) CAPS Take 1 tablet by mouth daily.    . tacrolimus (PROTOPIC) 0.1 % ointment Apply 1 application topically at bedtime. Applies for dermatitis.    . Vitamin K, Phytonadione, 100 MCG TABS Take 100 mcg by mouth daily.     Current Facility-Administered Medications on File Prior to Visit  Medication Dose Route Frequency Provider Last Rate Last Dose  . 0.9 %  sodium chloride infusion  500 mL Intravenous Continuous Pyrtle, Lajuan Lines, MD      . 0.9 %  sodium chloride infusion  500 mL Intravenous Once Pyrtle, Lajuan Lines, MD        Review of Systems  Constitution: Positive for malaise/fatigue. Negative for chills, decreased appetite and weight gain.  Cardiovascular: Positive for chest pain. Negative for dyspnea on exertion, leg swelling, palpitations and syncope.  Respiratory: Negative for shortness of breath.   Endocrine: Negative for cold intolerance.  Hematologic/Lymphatic: Does not bruise/bleed easily.  Skin: Negative for color change and nail changes.  Musculoskeletal: Negative for joint swelling.  Gastrointestinal: Negative for abdominal pain, anorexia  and change in bowel habit.  Neurological: Negative for headaches and light-headedness.  Psychiatric/Behavioral: Negative for depression and substance abuse.  All other systems reviewed and are negative.     Objective  Blood pressure 125/70, height 5' 6"  (1.676 m), weight 140 lb (63.5 kg). Body mass index is 22.6 kg/m. Physical exam not performed or limited due to virtual visit.  Patient appeared to be in no distress, Neck was supple, respiration was not labored.  Please see exam details from prior visit is as below.  Physical Exam  Constitutional: She is oriented to person, place, and time. She appears well-developed and well-nourished. No distress.  Pulmonary/Chest: Effort normal.  Neurological: She is alert and oriented to person, place, and time.  Psychiatric: She has a normal mood and affect. Her behavior is normal.  Nursing note and vitals reviewed.   Laboratory examination:   Lipid Panel     Component Value Date/Time   CHOL 209 (H) 07/21/2018 0732   TRIG 69 07/21/2018 0732   HDL 81 07/21/2018 0732   CHOLHDL 2.6 07/21/2018 0732   LDLCALC 114 (H) 07/21/2018 0732   Lipid panel 04/26/11 T. Chol of 146, LDL 134, HDL 68,  non-HDL 178.,  Labs 06/09/2012: Total cholesterol 177, triglycerides 99, HDL 57, LDL 100.  LDL particle number is moderately elevated at 1440.  LP-IR score was normal.  TSH was normal at 3.100.  Vitamin D was elevated at 84.1  Cardiac Studies:   Exercise Sestamibi Stress Test 07/14/2018 1. The resting electrocardiogram demonstrated normal sinus rhythm, normal resting conduction and normal rest repolarization.  The stress electrocardiogram was positive for ischemia with 2 mm horizontal to up sloping ST depression at peak exercise persisting for >3 min into recovery. No arrhythmias. Normal BP response. Patient exercised on Bruce protocol for 8:29 minutes and achieved 10.16 METS. Stress test terminated due to dyspnea and 88% MPHR achieved (Target HR >85%).  2.  Myocardial perfusion imaging is normal. Overall left ventricular systolic function was normal without regional wall motion abnormalities. The left ventricular ejection fraction was 73%.   3.  This is a low risk study, clinical correlation recommended.  Echocardiogram 05/30/2018: Left ventricle cavity is normal in size. Normal global wall motion. Doppler evidence of grade I (impaired) diastolic dysfunction, normal LAP. Calculated EF 55%. Mild (Grade I) mitral regurgitation. No evidence of pulmonary hypertension.   Stress EKG 11/08/11: Negative for ischemia. 8 min. 10 METs. 2 mm ST dep back to baseline < 2 min. Dyspnea present.  Assessment   Prinzmetal angina (HCC)   Raynaud's disease without gangrene   Pure hypercholesterolemia   Decreased exercise tolerance   EKG 05/16/2018: Normal sinus rhythm at 81 bpm, normal axis, no evidence of ischemia.   Recommendations:   I again discussed recently obtained echocardiogram and exercise nuclear stress test with the patient.  Echocardiogram was essentially normal, except for grade 1 diastolic dysfunction.  Normal LVEF.  No evidence of pulmonary hypertension to explain her dyspnea.  Perfusion was normal.  Fortunately her symptoms of angina have been stable and slightly improved since last seen by [place].   She does have risk factors with uncontrolled hyperlipidemia, raynaud's, and family history of early CAD.    She will need aggressive risk factor modifications with  controlling her hyperlipidemia.  She has not tolerated Lipitor in the past. Her last office visit and given her Livalo samples which she did not start.  She did not tolerate simvastatin and atorvastatin in the past.  She does not want to be on statins.  I have discussed with her regarding possible use of amlodipine if her symptoms of angina were to recur especially as winter months, long in view of underlying Raynaud's disease.  I'll see her back in 6 months.  Adrian Prows, MD, [facility]  08/23/2018, 7:55 AM [facility] [place] Pager: [phone] Office: [phone] If no answer Cell [phone]

## 2018-09-03 ENCOUNTER — Telehealth: Payer: Self-pay | Admitting: Internal Medicine

## 2018-09-03 NOTE — Telephone Encounter (Signed)

## 2018-09-04 ENCOUNTER — Encounter: Payer: Self-pay | Admitting: Internal Medicine

## 2018-09-04 ENCOUNTER — Telehealth: Payer: Self-pay | Admitting: *Deleted

## 2018-09-04 ENCOUNTER — Other Ambulatory Visit: Payer: Self-pay

## 2018-09-04 ENCOUNTER — Ambulatory Visit (AMBULATORY_SURGERY_CENTER): Payer: Managed Care, Other (non HMO) | Admitting: Internal Medicine

## 2018-09-04 VITALS — BP 80/44 | HR 64 | Temp 97.5°F | Resp 10 | Ht 66.5 in | Wt 140.0 lb

## 2018-09-04 DIAGNOSIS — D122 Benign neoplasm of ascending colon: Secondary | ICD-10-CM

## 2018-09-04 DIAGNOSIS — D125 Benign neoplasm of sigmoid colon: Secondary | ICD-10-CM | POA: Diagnosis not present

## 2018-09-04 DIAGNOSIS — D123 Benign neoplasm of transverse colon: Secondary | ICD-10-CM | POA: Diagnosis not present

## 2018-09-04 DIAGNOSIS — Z85038 Personal history of other malignant neoplasm of large intestine: Secondary | ICD-10-CM | POA: Diagnosis not present

## 2018-09-04 DIAGNOSIS — K635 Polyp of colon: Secondary | ICD-10-CM

## 2018-09-04 DIAGNOSIS — D124 Benign neoplasm of descending colon: Secondary | ICD-10-CM | POA: Diagnosis not present

## 2018-09-04 DIAGNOSIS — Z1509 Genetic susceptibility to other malignant neoplasm: Secondary | ICD-10-CM

## 2018-09-04 MED ORDER — SODIUM CHLORIDE 0.9 % IV SOLN: 500.0000 mL | INTRAVENOUS | Status: DC

## 2018-09-04 NOTE — Op Note (Signed)
[facility] Patient Name: Angela Hart Procedure Date: 09/04/2018 10:31 AM MRN: [id number] Endoscopist: Jerene Bears , MD Age: 68 Referring MD:  Date of Birth: 1951-01-30 Gender: Female Account #: [id number] Procedure:                Colonoscopy Indications:              High risk colon cancer surveillance: Personal                            history of hereditary nonpolyposis colorectal                            cancer (Lynch Syndrome) with personal history of                            colon cancer, sessile serrated polyps and                            adenomatous polyps, Last colonoscopy 1 year ago Medicines:                Monitored Anesthesia Care Procedure:                Pre-Anesthesia Assessment:                           - Prior to the procedure, a History and Physical                            was performed, and patient medications and                            allergies were reviewed. The patient's tolerance of                            previous anesthesia was also reviewed. The risks                            and benefits of the procedure and the sedation                            options and risks were discussed with the patient.                            All questions were answered, and informed consent                            was obtained. Prior Anticoagulants: The patient has                            taken no previous anticoagulant or antiplatelet                            agents. ASA Grade Assessment: II - A patient with  mild systemic disease. After reviewing the risks                            and benefits, the patient was deemed in                            satisfactory condition to undergo the procedure.                           After obtaining informed consent, the colonoscope                            was passed under direct vision. Throughout the                            procedure, the patient's blood  pressure, pulse, and                            oxygen saturations were monitored continuously. The                            Model PCF-[id number]) scope was introduced                            through the anus and advanced to the cecum,                            identified by appendiceal orifice and ileocecal                            valve. The colonoscopy was performed without                            difficulty. The patient tolerated the procedure                            well. The quality of the bowel preparation was                            good. The ileocecal valve, appendiceal orifice, and                            rectum were photographed. The bowel preparation                            used was Plenvu via split dose instruction. Scope In: 10:39:57 AM Scope Out: 11:06:36 AM Scope Withdrawal Time: 0 hours 21 minutes 13 seconds  Total Procedure Duration: 0 hours 26 minutes 39 seconds  Findings:                 The digital rectal exam was normal.                           Three sessile polyps were found in the ascending  colon. The polyps were 3 to 4 mm in size. These                            polyps were removed with a cold snare. Resection                            and retrieval were complete.                           Two sessile polyps were found in the transverse                            colon. The polyps were 3 to 4 mm in size. These                            polyps were removed with a cold snare. Resection                            and retrieval were complete.                           Three sessile polyps were found in the descending                            colon. The polyps were 4 to 6 mm in size. These                            polyps were removed with a cold snare. Resection                            and retrieval were complete.                           A 7 mm polyp was found in the sigmoid colon. The                             polyp was sessile. The polyp was removed with a                            cold snare. Resection and retrieval were complete.                           There was evidence of a prior end-to-side                            colo-colonic anastomosis in the transverse colon.                            This was patent and was characterized by healthy                            appearing mucosa.  Multiple small-mouthed diverticula were found in                            the sigmoid colon, descending colon and transverse                            colon.                           The retroflexed view of the distal rectum and anal                            verge was normal and showed no anal or rectal                            abnormalities. Complications:            No immediate complications. Estimated Blood Loss:     Estimated blood loss was minimal. Impression:               - Three 3 to 4 mm polyps in the ascending colon,                            removed with a cold snare. Resected and retrieved.                           - Two 3 to 4 mm polyps in the transverse colon,                            removed with a cold snare. Resected and retrieved.                           - Three 4 to 6 mm polyps in the descending colon,                            removed with a cold snare. Resected and retrieved.                           - One 7 mm polyp in the sigmoid colon, removed with                            a cold snare. Resected and retrieved.                           - Patent end-to-side colo-colonic anastomosis,                            characterized by healthy appearing mucosa.                           - Diverticulosis in the sigmoid colon, in the                            descending colon and in the transverse colon.                           -  The distal rectum and anal verge are normal on                            retroflexion  view. Recommendation:           - Patient has a contact number available for                            emergencies. The signs and symptoms of potential                            delayed complications were discussed with the                            patient. Return to normal activities tomorrow.                            Written discharge instructions were provided to the                            patient.                           - Resume previous diet.                           - Continue present medications.                           - Await pathology results.                           - Repeat colonoscopy in 1 year for surveillance. Jerene Bears, MD 09/04/2018 11:12:19 AM This report has been signed electronically.

## 2018-09-04 NOTE — Telephone Encounter (Signed)
-----   Message from Jerene Bears, MD sent at 09/04/2018 11:23 AM EDT ----- VCE - lynch syndrome Not urgent Thanks JMP

## 2018-09-04 NOTE — Patient Instructions (Addendum)
Handout given for diverticulosis, High fiber diet and polyps.  YOU HAD AN ENDOSCOPIC PROCEDURE TODAY AT [facility]:   Refer to the procedure report that was given to you for any specific questions about what was found during the examination.  If the procedure report does not answer your questions, please call your gastroenterologist to clarify.  If you requested that your care partner not be given the details of your procedure findings, then the procedure report has been included in a sealed envelope for you to review at your convenience later.  YOU SHOULD EXPECT: Some feelings of bloating in the abdomen. Passage of more gas than usual.  Walking can help get rid of the air that was put into your GI tract during the procedure and reduce the bloating. If you had a lower endoscopy (such as a colonoscopy or flexible sigmoidoscopy) you may notice spotting of blood in your stool or on the toilet paper. If you underwent a bowel prep for your procedure, you may not have a normal bowel movement for a few days.  Please Note:  You might notice some irritation and congestion in your nose or some drainage.  This is from the oxygen used during your procedure.  There is no need for concern and it should clear up in a day or so.  SYMPTOMS TO REPORT IMMEDIATELY:   Following lower endoscopy (colonoscopy or flexible sigmoidoscopy):  Excessive amounts of blood in the stool  Significant tenderness or worsening of abdominal pains  Swelling of the abdomen that is new, acute  Fever of 100F or higher  For urgent or emergent issues, a gastroenterologist can be reached at any hour by calling [phone].   DIET:  We do recommend a small meal at first, but then you may proceed to your regular diet.  Drink plenty of fluids but you should avoid alcoholic beverages for 24 hours.  ACTIVITY:  You should plan to take it easy for the rest of today and you should NOT DRIVE or use heavy machinery until  tomorrow (because of the sedation medicines used during the test).    FOLLOW UP: Our staff will call the number listed on your records 48-72 hours following your procedure to check on you and address any questions or concerns that you may have regarding the information given to you following your procedure. If we do not reach you, we will leave a message.  We will attempt to reach you two times.  During this call, we will ask if you have developed any symptoms of COVID 19. If you develop any symptoms (ie: fever, flu-like symptoms, shortness of breath, cough etc.) before then, please call [phone].  If you test positive for Covid 19 in the 2 weeks post procedure, please call and report this information to [place].    If any biopsies were taken you will be contacted by phone or by letter within the next 1-3 weeks.  Please call us at [phone] if you have not heard about the biopsies in 3 weeks.    SIGNATURES/CONFIDENTIALITY: You and/or your care partner have signed paperwork which will be entered into your electronic medical record.  These signatures attest to the fact that that the information above on your After Visit Summary has been reviewed and is understood.  Full responsibility of the confidentiality of this discharge information lies with you and/or your care-partner.

## 2018-09-04 NOTE — Telephone Encounter (Signed)
Patient has been scheduled for capsocam on 09/22/18 at 830 am. She ha been advised of time/date/location of test and has been mailed detailed instructions to her home address as well as mychart. She is asked to call me back with any specific questions she may have regarding prep. Patient is in agreement and verbalizes understanding.

## 2018-09-04 NOTE — Progress Notes (Signed)
Pt's states no medical or surgical changes since previsit or office visit. 

## 2018-09-04 NOTE — Progress Notes (Signed)
Called to room to assist during endoscopic procedure.  Patient ID and intended procedure confirmed with present staff. Received instructions for my participation in the procedure from the performing physician.  

## 2018-09-04 NOTE — Progress Notes (Signed)
To PACU, VSS. Report to RN.tb 

## 2018-09-08 ENCOUNTER — Telehealth: Payer: Self-pay | Admitting: *Deleted

## 2018-09-08 ENCOUNTER — Encounter: Payer: Self-pay | Admitting: Internal Medicine

## 2018-09-08 NOTE — Telephone Encounter (Signed)
Left message on f/u call 

## 2018-09-10 ENCOUNTER — Other Ambulatory Visit: Payer: Self-pay | Admitting: Cardiology

## 2018-09-21 ENCOUNTER — Encounter: Payer: Self-pay | Admitting: Cardiology

## 2018-09-30 NOTE — Addendum Note (Signed)
Addended by: Kela Millin on: 09/30/2018 09:37 PM   Modules accepted: Level of Service

## 2018-10-11 HISTORY — PX: ABDOMINO-VAGINAL VESICAL NECK SUSPENSION: SUR10

## 2018-11-04 ENCOUNTER — Other Ambulatory Visit: Payer: Self-pay | Admitting: Physician Assistant

## 2018-12-11 HISTORY — PX: OTHER SURGICAL HISTORY: SHX169

## 2019-01-28 ENCOUNTER — Other Ambulatory Visit: Payer: Self-pay | Admitting: Physician Assistant

## 2019-03-16 ENCOUNTER — Other Ambulatory Visit: Payer: Self-pay | Admitting: Cardiology

## 2019-03-27 ENCOUNTER — Other Ambulatory Visit: Payer: Self-pay

## 2019-03-27 ENCOUNTER — Ambulatory Visit (INDEPENDENT_AMBULATORY_CARE_PROVIDER_SITE_OTHER): Payer: Medicare Other | Admitting: Cardiology

## 2019-03-27 ENCOUNTER — Encounter: Payer: Self-pay | Admitting: Cardiology

## 2019-03-27 VITALS — BP 118/66 | HR 74 | Ht 66.5 in | Wt 144.0 lb

## 2019-03-27 DIAGNOSIS — E7800 Pure hypercholesterolemia, unspecified: Secondary | ICD-10-CM

## 2019-03-27 DIAGNOSIS — I201 Angina pectoris with documented spasm: Secondary | ICD-10-CM

## 2019-03-27 DIAGNOSIS — I7300 Raynaud's syndrome without gangrene: Secondary | ICD-10-CM | POA: Diagnosis not present

## 2019-03-27 MED ORDER — NEXLETOL 180 MG PO TABS: 1.0000 | ORAL_TABLET | Freq: Every day | ORAL | 1 refills | Status: DC

## 2019-03-27 MED ORDER — NITROGLYCERIN 0.4 MG/SPRAY TL SOLN: 1.0000 | 1 refills | Status: DC | PRN

## 2019-03-27 NOTE — Progress Notes (Signed)
Primary Physician/Referring:  [name], MD  Patient ID: Angela Hart, female    DOB: [birth year], 69 y.o.   MRN: [id number]  No chief complaint on file.  HPI:    Angela Hart  is a 69 y.o. female with Lynch syndrome underwent prophylactic hysterectomy- for cancer (Lynch Syndrome: Colon cancer and Uterus and  less likely other organs). She has had partial colectomy for colon cancer. Also has printzemal angina diagnosed in 2014, hyperlipidemia, and family history of early CAD.   She was told to have Printzemal angina in 2014. Chest discomfort that radiates up to her jaw and mostly noted during routine activity or rest. She is having anginal episodes more frequently as expected in winter and requests NTG. She is also having more Raynaud's phenomenon and wears warm gloves.   Past Medical History:  Diagnosis Date  . Abdominal pain   . Allergy   . Anemia   . Anxiety   . Arthritis    hands/hip, gout left ankle  . Asthma    lungs damaged by chemo - uses inhaler for "chemo toxicity "  . Blood transfusion 03/2010   at [facility]  . Cataract   . Colon cancer ([place]) 03/2010  . Concussion    per pt, has had 2-3 concussions due to horse injuries  . Depression   . Fatigue   . GERD (gastroesophageal reflux disease)    occasional  . Heart murmur    hx -no problems  . Hyperlipidemia   . Interstitial cystitis   . Lactose intolerance   . Lynch syndrome 12/22/2011  . Major depression    clinical- sees leslie brown  . MVP (mitral valve prolapse)    mild-seen on echo 2008  . Neuromuscular disorder ([place])    raynaud's disease hands/feet  . Neuropathy   . Obstructive sleep apnea    very mild  . Osteoarthritis   . Osteoporosis   . Pelvic fracture ([place])    while riding a horse in 2008  . Peripheral neuropathy    mild- due to chemotherapy  . Prinzmetal angina ([place])   . Raynaud's syndrome    hands/feet  . Serrated adenoma of colon   . SVD (spontaneous vaginal delivery)    x 1  .  Thyroid disease    Past Surgical History:  Procedure Laterality Date  . BUNIONECTOMY     Bil/ hammer toe on right foot  . COLONOSCOPY    . COLONOSCOPY WITH PROPOFOL N/A 06/10/2012   Procedure: COLONOSCOPY WITH PROPOFOL;  Surgeon: Garlan Fair, MD;  Location: [facility];  Service: Endoscopy;  Laterality: N/A;  . COLONOSCOPY WITH PROPOFOL N/A 09/22/2013   Procedure: COLONOSCOPY WITH PROPOFOL;  Surgeon: Garlan Fair, MD;  Location: [facility];  Service: Endoscopy;  Laterality: N/A;  . COLONOSCOPY WITH PROPOFOL N/A 08/02/2015   Procedure: COLONOSCOPY WITH PROPOFOL;  Surgeon: Garlan Fair, MD;  Location: [facility];  Service: Endoscopy;  Laterality: N/A;  . ESOPHAGOGASTRODUODENOSCOPY (EGD) WITH PROPOFOL N/A 06/10/2012   Procedure: ESOPHAGOGASTRODUODENOSCOPY (EGD) WITH PROPOFOL;  Surgeon: Garlan Fair, MD;  Location: [facility];  Service: Endoscopy;  Laterality: N/A;  . ESOPHAGOGASTRODUODENOSCOPY (EGD) WITH PROPOFOL N/A 09/22/2013   Procedure: ESOPHAGOGASTRODUODENOSCOPY (EGD) WITH PROPOFOL;  Surgeon: Garlan Fair, MD;  Location: [facility];  Service: Endoscopy;  Laterality: N/A;  . FOOT NEUROMA SURGERY Right   . MENISCUS REPAIR Right    for meniscus tear  . NASAL SINUS SURGERY  1993  . OVARIAN CYST REMOVAL  Laparotomy  . PARTIAL COLECTOMY  04/12/2010  . POLYPECTOMY    . port a cath insertion  2012   for chemo use  . PORT-A-CATH REMOVAL  05/03/2011  . RETINAL TEAR REPAIR CRYOTHERAPY Right   . TOOTH EXTRACTION     molars  . TOTAL ABDOMINAL HYSTERECTOMY W/ BILATERAL SALPINGOOPHORECTOMY  2013  . UPPER GASTROINTESTINAL ENDOSCOPY    . VARICOSE VEIN SURGERY Bilateral    Social History   Tobacco Use  . Smoking status: Never Smoker  . Smokeless tobacco: Never Used  . Tobacco comment: high school years 57 - tried  to smoke and did not take or like it   Substance Use Topics  . Alcohol use: No    ROS  Review of Systems  Constitution: Negative for weight gain.   Cardiovascular: Positive for chest pain. Negative for dyspnea on exertion, leg swelling and syncope.  Respiratory: Negative for hemoptysis.   Endocrine: Negative for cold intolerance.  Hematologic/Lymphatic: Does not bruise/bleed easily.  Gastrointestinal: Negative for hematochezia and melena.  Neurological: Negative for headaches and light-headedness.   Objective  There were no vitals taken for this visit.  Vitals with BMI 09/04/2018 09/04/2018 09/04/2018  Height - - -  Weight - - -  BMI - - -  Systolic 80 [id number]  Diastolic 44 50 53  Pulse 64 63 62     Physical Exam  Constitutional: She appears well-developed and well-nourished.  Neck: No thyromegaly present.  Cardiovascular: Normal rate, regular rhythm, normal heart sounds and intact distal pulses. Exam reveals no gallop.  No murmur heard. No leg edema, no JVD.  Pulmonary/Chest: Effort normal and breath sounds normal.  Abdominal: Soft. Bowel sounds are normal.  Musculoskeletal:     Cervical back: Neck supple.  Skin: Skin is warm and dry.   Laboratory examination:   No results for input(s): NA, K, CL, CO2, GLUCOSE, BUN, CREATININE, CALCIUM, GFRNONAA, GFRAA in the last 8760 hours. CrCl cannot be calculated (Patient's most recent lab result is older than the maximum 21 days allowed.).  CMP Latest Ref Rng & Units 12/22/2011 12/12/2011 05/15/2010  Glucose 70 - 99 mg/dL 88 96 82  BUN 6 - 23 mg/dL 8 13 10   Creatinine 0.50 - 1.10 mg/dL 0.67 0.72 0.71  Sodium 135 - 145 mEq/L 138 138 138  Potassium 3.5 - 5.1 mEq/L 3.7 3.8 4.4  Chloride 96 - 112 mEq/L 101 99 101  CO2 19 - 32 mEq/L 28 32 29  Calcium 8.4 - 10.5 mg/dL 8.9 9.6 9.6  Total Protein 6.0 - 8.3 g/dL 5.4(L) 7.0 -  Total Bilirubin 0.3 - 1.2 mg/dL 0.3 0.2(L) -  Alkaline Phos 39 - 117 U/L 67 82 -  AST 0 - 37 U/L 20 30 -  ALT 0 - 35 U/L 17 28 -   CBC Latest Ref Rng & Units 12/22/2011 12/12/2011 05/15/2010  WBC 4.0 - 10.5 K/uL 7.7 5.1 5.1  Hemoglobin 12.0 - 15.0 g/dL 10.9(L) 12.2  10.7(L)  Hematocrit 36.0 - 46.0 % 32.6(L) 37.4 34.4(L)  Platelets 150 - 400 K/uL 276 318 384   Lipid Panel     Component Value Date/Time   CHOL 209 (H) 07/21/2018 0732   TRIG 69 07/21/2018 0732   HDL 81 07/21/2018 0732   CHOLHDL 2.6 07/21/2018 0732   LDLCALC 114 (H) 07/21/2018 0732   HEMOGLOBIN A1C No results found for: HGBA1C, MPG TSH No results for input(s): TSH in the last 8760 hours.  Medications and allergies  No Known  Allergies   Current Outpatient Medications  Medication Instructions  . Acetylcysteine (NAC 600 PO) 600 mg, Daily  . ASTAXANTHIN PO 6 mg, Daily  . buPROPion (WELLBUTRIN XL) 300 mg, Daily before breakfast  . calcium carbonate (TUMS - DOSED IN MG ELEMENTAL CALCIUM) 500 MG chewable tablet 1 tablet, 3 times daily PRN  . Co Q 10 100 mg, Daily  . cycloSPORINE (RESTASIS OP) Ophthalmic, Put one drop in both eyes twice a day   . DULoxetine (CYMBALTA) 60 MG capsule 1 capsule, Oral, BH-each morning  . fluticasone (FLONASE) 50 MCG/ACT nasal spray 1 spray, Each Nare, Daily  . Glucomannan 1000 MG CAPS 1 Can, Oral, Daily  . Glucosamine-Chondroit-Vit C-Mn (GLUCOSAMINE CHONDR 1500 COMPLX PO) 1 capsule, Oral, 2 times daily  . ibuprofen (ADVIL) 200 mg, Every 6 hours PRN  . levothyroxine (SYNTHROID) 125 mcg, Oral, Daily before breakfast  . Magnesium 400 mg, Oral, Daily  . metoCLOPramide (REGLAN) 10 mg, Oral, As directed, Take 1 hour prior to each prep dose  . Multiple Vitamin (MULTIVITAMIN WITH MINERALS) TABS 1 tablet, Daily  . NATTOKINASE PO 1 capsule, Oral, Daily  . nitroGLYCERIN (NITROLINGUAL) 0.4 MG/SPRAY spray 1 spray, Sublingual, Every 5 min x3 PRN  . Polyethyl Glycol-Propyl Glycol (SYSTANE) 0.4-0.3 % SOLN 1 drop, 3 times daily PRN  . Probiotic Product (VISBIOME HIGH POTENCY) CAPS 1 tablet, Oral, Daily  . simvastatin (ZOCOR) 20 MG tablet TAKE 1 TABLET BY MOUTH EVERYDAY AT BEDTIME  . tacrolimus (PROTOPIC) 0.1 % ointment 1 application, Topical, Daily at bedtime, Applies  for dermatitis.  . Vitamin B-12 2,500 mcg, Sublingual, Daily  . vitamin C 1,000 mg, 2 times daily  . Vitamin D 2,000 Units, Oral, 2 times daily  . Vitamin K (Phytonadione) 100 mcg, Daily    Radiology:  No results found.  Cardiac Studies:   Exercise Sestamibi Stress Test 07/14/2018 1. The resting electrocardiogram demonstrated normal sinus rhythm, normal resting conduction and normal rest repolarization.  The stress electrocardiogram was positive for ischemia with 2 mm horizontal to up sloping ST depression at peak exercise persisting for >3 min into recovery. No arrhythmias. Normal BP response. Patient exercised on Bruce protocol for 8:29 minutes and achieved 10.16 METS. Stress test terminated due to dyspnea and 88% MPHR achieved (Target HR >85%).  2. Myocardial perfusion imaging is normal. Overall left ventricular systolic function was normal without regional wall motion abnormalities. The left ventricular ejection fraction was 73%.   3.  This is a low risk study, clinical correlation recommended.  Echocardiogram 05/30/2018: Left ventricle cavity is normal in size. Normal global wall motion. Doppler evidence of grade I (impaired) diastolic dysfunction, normal LAP. Calculated EF 55%. Mild (Grade I) mitral regurgitation. No evidence of pulmonary hypertension.   Stress EKG 11/08/11: Negative for ischemia. 8 min. 10 METs. 2 mm ST dep back to baseline < 2 min. Dyspnea present.  Assessment     ICD-10-CM   1. Prinzmetal angina (HCC)  I20.1   2. Raynaud's disease without gangrene  I73.00   3. Pure hypercholesterolemia  E78.00     EKG 03/26/2018: Normal sinus rhythm at rate of 70 bpm, left atrial enlargement, normal axis.  Nonspecific ST depression in inferolateral leads, consider LVH.  Normal QT interval.   No orders of the defined types were placed in this encounter.   There are no discontinued medications.   Recommendations:   Angela Hart  is a 69 y.o. female with Lynch syndrome  underwent prophylactic hysterectomy- for cancer (Lynch Syndrome: Colon  cancer and Uterus and  less likely other organs). She has had partial colectomy for colon cancer. Also has printzemal angina diagnosed in 2014, hyperlipidemia, and family history of early CAD. She does not want to be on statins.   She does have risk factors with uncontrolled hyperlipidemia, raynaud's, and family history of early CAD.  She has not tolerated Lipitor and simvastatin in the past, was given Livalo samples which she did not start. She is willing to try Bempidoic acid (Nexletol)   More frequent Prinzmetal's angina and also Raynaud's phenomenon is to be expected. I was planning to start Amlodipine, patient states she did not tolerate this but not sure if this was amlodipine.   I have refilled the prescription for nitroglycerin, I will see her back in a year or sooner if problems.  She'll contact me and let me know if she did tolerate Nexletol  Adrian Prows, MD, [facility] 03/27/2019, [facility]. [place] Office: [phone]

## 2019-04-26 ENCOUNTER — Other Ambulatory Visit: Payer: Self-pay | Admitting: Cardiology

## 2019-04-26 DIAGNOSIS — I201 Angina pectoris with documented spasm: Secondary | ICD-10-CM

## 2019-04-26 DIAGNOSIS — I7300 Raynaud's syndrome without gangrene: Secondary | ICD-10-CM

## 2019-07-06 ENCOUNTER — Encounter: Payer: Self-pay | Admitting: *Deleted

## 2019-07-07 ENCOUNTER — Other Ambulatory Visit: Payer: Self-pay

## 2019-07-07 ENCOUNTER — Ambulatory Visit (INDEPENDENT_AMBULATORY_CARE_PROVIDER_SITE_OTHER): Payer: Medicare Other | Admitting: Physician Assistant

## 2019-07-07 ENCOUNTER — Encounter: Payer: Self-pay | Admitting: Physician Assistant

## 2019-07-07 DIAGNOSIS — Z1283 Encounter for screening for malignant neoplasm of skin: Secondary | ICD-10-CM | POA: Diagnosis not present

## 2019-07-07 DIAGNOSIS — Z85828 Personal history of other malignant neoplasm of skin: Secondary | ICD-10-CM | POA: Diagnosis not present

## 2019-07-07 DIAGNOSIS — L570 Actinic keratosis: Secondary | ICD-10-CM

## 2019-07-07 NOTE — Progress Notes (Addendum)
   Follow-Up Visit   Subjective  Angela Hart is a 69 y.o. female who presents for the following: Follow-up (2 month f/u- face-tx 5FU-  right ear & right cheek-scaly spot (10 appilication so far))  She has a history of numerous NMSC. The following portions of the chart were reviewed this encounter and updated as appropriate: Tobacco  Allergies  Meds  Problems  Med Hx  Surg Hx  Fam Hx      Objective  Well appearing patient in no apparent distress; mood and affect are within normal limits.  A focused examination was performed including face, legs. Relevant physical exam findings are noted in the Assessment and Plan.  Objective  Right Scaphoid Fossa, Right Temple: Erythematous patches with gritty scale.  Objective  Left Lower Leg - Anterior, Right Lower Leg - Anterior: All scars clear  Objective  face and legs: All scars clear  Assessment & Plan  AK (actinic keratosis) (2) Right Scaphoid Fossa; Right Temple    Destruction of lesion - Right Scaphoid Fossa, Right Temple Complexity: simple   Destruction method: cryotherapy   Informed consent: discussed and consent obtained   Timeout:  patient name, date of birth, surgical site, and procedure verified Lesion destroyed using liquid nitrogen: Yes   Cryotherapy cycles:  1 Outcome: patient tolerated procedure well with no complications   Post-procedure details: wound care instructions given    History of SCC (squamous cell carcinoma) of skin (2) Left Lower Leg - Anterior; Right Lower Leg - Anterior  observe  Screening exam for skin cancer face and legs  observe

## 2019-07-24 ENCOUNTER — Other Ambulatory Visit: Payer: Self-pay | Admitting: Orthopedic Surgery

## 2019-07-24 DIAGNOSIS — G8929 Other chronic pain: Secondary | ICD-10-CM

## 2019-08-11 NOTE — Addendum Note (Signed)
Addended by: [name] R on: 08/11/2019 12:33 PM   Modules accepted: Level of Service

## 2019-08-21 ENCOUNTER — Other Ambulatory Visit: Payer: Medicare Other

## 2019-08-22 ENCOUNTER — Ambulatory Visit
Admission: RE | Admit: 2019-08-22 | Discharge: 2019-08-22 | Disposition: A | Discharge status: Home / Self Care | Payer: Medicare Other | Source: Ambulatory Visit | Attending: Orthopedic Surgery | Admitting: Orthopedic Surgery

## 2019-08-22 ENCOUNTER — Other Ambulatory Visit: Payer: Self-pay

## 2019-08-22 DIAGNOSIS — G8929 Other chronic pain: Secondary | ICD-10-CM

## 2019-08-22 DIAGNOSIS — M25562 Pain in left knee: Secondary | ICD-10-CM

## 2019-08-26 ENCOUNTER — Other Ambulatory Visit: Payer: Self-pay

## 2019-08-26 DIAGNOSIS — I7300 Raynaud's syndrome without gangrene: Secondary | ICD-10-CM

## 2019-08-26 DIAGNOSIS — I201 Angina pectoris with documented spasm: Secondary | ICD-10-CM

## 2019-08-26 MED ORDER — NITROGLYCERIN 0.4 MG SL SUBL: SUBLINGUAL_TABLET | SUBLINGUAL | 3 refills | Status: DC

## 2019-09-04 ENCOUNTER — Other Ambulatory Visit: Payer: Self-pay | Admitting: *Deleted

## 2019-09-15 ENCOUNTER — Encounter: Payer: Self-pay | Admitting: Internal Medicine

## 2019-09-16 ENCOUNTER — Ambulatory Visit (AMBULATORY_SURGERY_CENTER): Payer: Self-pay | Admitting: *Deleted

## 2019-09-16 ENCOUNTER — Other Ambulatory Visit: Payer: Self-pay

## 2019-09-16 VITALS — Ht 66.5 in | Wt 148.0 lb

## 2019-09-16 DIAGNOSIS — Z1509 Genetic susceptibility to other malignant neoplasm: Secondary | ICD-10-CM

## 2019-09-16 DIAGNOSIS — Z86010 Personal history of colonic polyps: Secondary | ICD-10-CM

## 2019-09-16 MED ORDER — SUTAB 1479-225-188 MG PO TABS: 1.0000 | ORAL_TABLET | ORAL | 0 refills | Status: DC

## 2019-09-16 MED ORDER — METOCLOPRAMIDE HCL 10 MG PO TABS: 10.0000 mg | ORAL_TABLET | ORAL | 0 refills | Status: DC

## 2019-09-16 NOTE — Progress Notes (Signed)
No egg or soy allergy known to patient  No issues with past sedation with any surgeries  or procedures, no intubation problems  No diet pills per patient No home 02 use per patient  No blood thinners per patient  Pt denies issues with constipation  No A fib or A flutter   COVID 19 guidelines implemented in PV today   COVID vaccine completed on 06/2019  Given RX for Reglan per patient request as she has this for her last colon Sutab coupon given to patient at pre visit Due to the COVID-19 pandemic we are asking patients to follow these guidelines. Please only bring one care partner. Please be aware that your care partner may wait in the car in the parking lot or if they feel like they will be too hot to wait in the car, they may wait in the lobby on the 4th floor. All care partners are required to wear a mask the entire time (we do not have any that we can provide them), they need to practice social distancing, and we will do a Covid check for all patient's and care partners when you arrive. Also we will check their temperature and your temperature. If the care partner waits in their car they need to stay in the parking lot the entire time and we will call them on their cell phone when the patient is ready for discharge so they can bring the car to the front of the building. Also all patient's will need to wear a mask into building.

## 2019-09-30 ENCOUNTER — Telehealth: Payer: Self-pay | Admitting: Internal Medicine

## 2019-09-30 NOTE — Telephone Encounter (Signed)
Pt is scheduled for a colon next Monday 7/26 with Dr. Hilarie Fredrickson. She called requesting if she could have a pill cam on the same day. She stated that her oncologist wants her to have pill cam done. Pt said that she has no issues swallowing the pill. However, going through the prep is very challenging so since she will be prepping for the colonoscopy, she is wondering if she could do the pill cam after. Pls call her.

## 2019-09-30 NOTE — Telephone Encounter (Signed)
Spoke with pt and let her know that both procedures cannot be done that day due to insurance. Discussed with prep for capsule endo and let her know it is not as bad as the colon prep and she felt better after hearing that information. Pt will take care of capsule endo at a later date.

## 2019-10-05 ENCOUNTER — Encounter: Payer: Self-pay | Admitting: Internal Medicine

## 2019-10-05 ENCOUNTER — Ambulatory Visit (AMBULATORY_SURGERY_CENTER): Payer: Medicare Other | Admitting: Internal Medicine

## 2019-10-05 ENCOUNTER — Other Ambulatory Visit: Payer: Self-pay

## 2019-10-05 VITALS — BP 116/60 | HR 61 | Temp 97.4°F | Resp 11 | Ht 66.5 in | Wt 148.0 lb

## 2019-10-05 DIAGNOSIS — Z1509 Genetic susceptibility to other malignant neoplasm: Secondary | ICD-10-CM | POA: Diagnosis not present

## 2019-10-05 DIAGNOSIS — Z86010 Personal history of colonic polyps: Secondary | ICD-10-CM | POA: Diagnosis not present

## 2019-10-05 DIAGNOSIS — D123 Benign neoplasm of transverse colon: Secondary | ICD-10-CM

## 2019-10-05 DIAGNOSIS — Z85038 Personal history of other malignant neoplasm of large intestine: Secondary | ICD-10-CM | POA: Diagnosis not present

## 2019-10-05 MED ORDER — SODIUM CHLORIDE 0.9 % IV SOLN: 500.0000 mL | Freq: Once | INTRAVENOUS | Status: DC

## 2019-10-05 NOTE — Progress Notes (Signed)
PT taken to PACU. Monitors in place. VSS. Report given to RN. 

## 2019-10-05 NOTE — Op Note (Signed)
[place] Patient Name: Angela Hart Procedure Date: 10/05/2019 1:20 PM MRN: [id number] Endoscopist: Jerene Bears , MD Age: 69 Referring MD:  Date of Birth: 03-19-1950 Gender: Female Account #: [id number] Procedure:                Colonoscopy Indications:              High risk colon cancer surveillance: Personal                            history of Lynch syndrome, colon cancer s/p partial                            colectomy and personal history of adenomatous and                            sessile-serrated colon polyps, Last colonoscopy 1                            year ago Medicines:                Monitored Anesthesia Care Procedure:                Pre-Anesthesia Assessment:                           - Prior to the procedure, a History and Physical                            was performed, and patient medications and                            allergies were reviewed. The patient's tolerance of                            previous anesthesia was also reviewed. The risks                            and benefits of the procedure and the sedation                            options and risks were discussed with the patient.                            All questions were answered, and informed consent                            was obtained. Prior Anticoagulants: The patient has                            taken no previous anticoagulant or antiplatelet                            agents. ASA Grade Assessment: II - A patient with  mild systemic disease. After reviewing the risks                            and benefits, the patient was deemed in                            satisfactory condition to undergo the procedure.                           After obtaining informed consent, the colonoscope                            was passed under direct vision. Throughout the                            procedure, the patient's blood pressure, pulse, and                             oxygen saturations were monitored continuously. The                            Colonoscope was introduced through the anus and                            advanced to the cecum, identified by appendiceal                            orifice and ileocecal valve. The colonoscopy was                            performed without difficulty. The patient tolerated                            the procedure well. The quality of the bowel                            preparation was fair clearing to good with copious                            irrigation and lavage (SuTab was used, Plenvu                            worked better in 2020). The ileocecal valve,                            appendiceal orifice, and rectum were photographed. Scope In: 1:37:42 PM Scope Out: 1:59:40 PM Scope Withdrawal Time: 0 hours 16 minutes 32 seconds  Total Procedure Duration: 0 hours 21 minutes 58 seconds  Findings:                 The digital rectal exam was normal.                           A 5 mm polyp was found in the transverse  colon. The                            polyp was sessile. The polyp was removed with a                            cold snare. Resection and retrieval were complete.                           There was evidence of a prior end-to-side                            colo-colonic anastomosis in the transverse colon.                            This was patent and was characterized by healthy                            appearing mucosa.                           Multiple small-mouthed diverticula were found in                            the sigmoid colon, descending colon and transverse                            colon.                           Retroflexion in the rectum was not performed due to                            narrow rectal vault. Normal appearing rectum and                            anal canal on forward views. Complications:            No immediate  complications. Estimated Blood Loss:     Estimated blood loss was minimal. Impression:               - One 5 mm polyp in the transverse colon, removed                            with a cold snare. Resected and retrieved.                           - Patent end-to-side colo-colonic anastomosis,                            characterized by healthy appearing mucosa.                           - Diverticulosis in the sigmoid colon, in the  descending colon and in the transverse colon. Recommendation:           - Patient has a contact number available for                            emergencies. The signs and symptoms of potential                            delayed complications were discussed with the                            patient. Return to normal activities tomorrow.                            Written discharge instructions were provided to the                            patient.                           - Resume previous diet.                           - Continue present medications.                           - Await pathology results.                           - Repeat colonoscopy in 1 year for surveillance                            (liquid prep).                           - To visualize the small bowel in the setting of                            Lynch syndrome, perform video capsule endoscopy at                            appointment to be scheduled next available. Jerene Bears, MD 10/05/2019 2:09:37 PM This report has been signed electronically.

## 2019-10-05 NOTE — Progress Notes (Signed)
Pt's states no medical or surgical changes since previsit or office visit.  CW - vitals 

## 2019-10-05 NOTE — Progress Notes (Signed)
Called to room to assist during endoscopic procedure.  Patient ID and intended procedure confirmed with present staff. Received instructions for my participation in the procedure from the performing physician.  

## 2019-10-05 NOTE — Patient Instructions (Signed)
YOU HAD AN ENDOSCOPIC PROCEDURE TODAY AT [facility]:   Refer to the procedure report that was given to you for any specific questions about what was found during the examination.  If the procedure report does not answer your questions, please call your gastroenterologist to clarify.  If you requested that your care partner not be given the details of your procedure findings, then the procedure report has been included in a sealed envelope for you to review at your convenience later.  YOU SHOULD EXPECT: Some feelings of bloating in the abdomen. Passage of more gas than usual.  Walking can help get rid of the air that was put into your GI tract during the procedure and reduce the bloating. If you had a lower endoscopy (such as a colonoscopy or flexible sigmoidoscopy) you may notice spotting of blood in your stool or on the toilet paper. If you underwent a bowel prep for your procedure, you may not have a normal bowel movement for a few days.  Please Note:  You might notice some irritation and congestion in your nose or some drainage.  This is from the oxygen used during your procedure.  There is no need for concern and it should clear up in a day or so.  SYMPTOMS TO REPORT IMMEDIATELY:   Following lower endoscopy (colonoscopy or flexible sigmoidoscopy):  Excessive amounts of blood in the stool  Significant tenderness or worsening of abdominal pains  Swelling of the abdomen that is new, acute  Fever of 100F or higher   For urgent or emergent issues, a gastroenterologist can be reached at any hour by calling [phone]. Do not use MyChart messaging for urgent concerns.    DIET:  We do recommend a small meal at first, but then you may proceed to your regular diet.  Drink plenty of fluids but you should avoid alcoholic beverages for 24 hours.  MEDICATIONS: Continue present medications.  Please see handouts given to you by your recovery nurse.  FOLLOW UP: In order to  visualize the small bowel in the setting of Lynch syndrome, Dr. Hilarie Fredrickson recommends we perform a video capsule endoscopy at an appointment to be scheduled. Dr. Vena Rua nurse will call you to schedule this at the next available appointment.  Procedure report faxed to Dr. Renda Rolls (oncology).  ACTIVITY:  You should plan to take it easy for the rest of today and you should NOT DRIVE or use heavy machinery until tomorrow (because of the sedation medicines used during the test).    FOLLOW UP: Our staff will call the number listed on your records 48-72 hours following your procedure to check on you and address any questions or concerns that you may have regarding the information given to you following your procedure. If we do not reach you, we will leave a message.  We will attempt to reach you two times.  During this call, we will ask if you have developed any symptoms of COVID 19. If you develop any symptoms (ie: fever, flu-like symptoms, shortness of breath, cough etc.) before then, please call [phone].  If you test positive for Covid 19 in the 2 weeks post procedure, please call and report this information to [place].    If any biopsies were taken you will be contacted by phone or by letter within the next 1-3 weeks.  Please call us at [phone] if you have not heard about the biopsies in 3 weeks.   Thank you for allowing [place] to  provide for your healthcare needs today.   SIGNATURES/CONFIDENTIALITY: You and/or your care partner have signed paperwork which will be entered into your electronic medical record.  These signatures attest to the fact that that the information above on your After Visit Summary has been reviewed and is understood.  Full responsibility of the confidentiality of this discharge information lies with you and/or your care-partner.

## 2019-10-07 ENCOUNTER — Telehealth: Payer: Self-pay

## 2019-10-07 NOTE — Telephone Encounter (Signed)
Second post procedure follow up call, no answer 

## 2019-10-07 NOTE — Telephone Encounter (Signed)
Called [phone] and left a message we tried to reach pt for a follow up call. maw

## 2019-10-09 ENCOUNTER — Encounter: Payer: Self-pay | Admitting: Internal Medicine

## 2019-12-29 ENCOUNTER — Ambulatory Visit (INDEPENDENT_AMBULATORY_CARE_PROVIDER_SITE_OTHER): Payer: Medicare Other | Admitting: Dermatology

## 2019-12-29 ENCOUNTER — Encounter: Payer: Self-pay | Admitting: Dermatology

## 2019-12-29 ENCOUNTER — Other Ambulatory Visit: Payer: Self-pay

## 2019-12-29 DIAGNOSIS — L309 Dermatitis, unspecified: Secondary | ICD-10-CM

## 2019-12-29 DIAGNOSIS — Z85828 Personal history of other malignant neoplasm of skin: Secondary | ICD-10-CM

## 2019-12-29 DIAGNOSIS — L299 Pruritus, unspecified: Secondary | ICD-10-CM

## 2019-12-29 DIAGNOSIS — Z86018 Personal history of other benign neoplasm: Secondary | ICD-10-CM | POA: Diagnosis not present

## 2019-12-29 DIAGNOSIS — I201 Angina pectoris with documented spasm: Secondary | ICD-10-CM | POA: Diagnosis not present

## 2019-12-29 DIAGNOSIS — Z1283 Encounter for screening for malignant neoplasm of skin: Secondary | ICD-10-CM | POA: Diagnosis not present

## 2019-12-29 MED ORDER — BETAMETHASONE DIPROPIONATE AUG 0.05 % EX CREA: TOPICAL_CREAM | Freq: Every day | CUTANEOUS | 1 refills | Status: DC

## 2019-12-31 NOTE — Progress Notes (Signed)
   Follow-Up Visit   Subjective  Angela Hart is a 68 y.o. female who presents for the following: Follow-up (Patient here today for 6  month follow up.  Patient states that she wants to schedule light therapy for her face but she wants to know what else she can use besided the topical creams for the rest of her body becasue she gets very sick when using the topicals for AK's.  Patient also wants a prescription for Retin-A).  6 Month follow up Location:  Duration:  Quality:  Associated Signs/Symptoms: Modifying Factors:  Severity:  Timing: Context:   Objective  Well appearing patient in no apparent distress; mood and affect are within normal limits.  Skin examination of the face, back and lower legs.   Assessment & Plan    History of squamous cell carcinoma of skin Right Lower Leg - Anterior  Annual skin exam  History of dysplastic nevus Left Axilla  Yearly skin checks  Itchy skin Mid Back  Over the counter Cerave anti itch- sample and coupon given  Dermatitis Neck - Anterior  Ordered Medications: augmented betamethasone dipropionate (DIPROLENE-AF) 0.05 % cream     I, Lavonna Monarch, MD, have reviewed all documentation for this visit.  The documentation on 12/31/19 for the exam, diagnosis, procedures, and orders are all accurate and complete.

## 2020-01-05 ENCOUNTER — Ambulatory Visit: Payer: Medicare Other | Admitting: Physician Assistant

## 2020-01-25 ENCOUNTER — Other Ambulatory Visit (HOSPITAL_BASED_OUTPATIENT_CLINIC_OR_DEPARTMENT_OTHER): Payer: Self-pay

## 2020-01-25 DIAGNOSIS — G4710 Hypersomnia, unspecified: Secondary | ICD-10-CM

## 2020-02-06 ENCOUNTER — Encounter: Payer: Self-pay | Admitting: Dermatology

## 2020-02-22 ENCOUNTER — Ambulatory Visit: Payer: Medicare Other

## 2020-02-29 ENCOUNTER — Ambulatory Visit (HOSPITAL_BASED_OUTPATIENT_CLINIC_OR_DEPARTMENT_OTHER): Payer: Medicare Other | Attending: Internal Medicine | Admitting: Internal Medicine

## 2020-02-29 ENCOUNTER — Other Ambulatory Visit: Payer: Self-pay

## 2020-02-29 DIAGNOSIS — G4710 Hypersomnia, unspecified: Secondary | ICD-10-CM | POA: Diagnosis present

## 2020-03-01 ENCOUNTER — Ambulatory Visit (HOSPITAL_BASED_OUTPATIENT_CLINIC_OR_DEPARTMENT_OTHER): Payer: Medicare Other | Attending: Internal Medicine | Admitting: Internal Medicine

## 2020-03-01 DIAGNOSIS — G4711 Idiopathic hypersomnia with long sleep time: Secondary | ICD-10-CM | POA: Diagnosis not present

## 2020-03-01 DIAGNOSIS — G4710 Hypersomnia, unspecified: Secondary | ICD-10-CM | POA: Diagnosis present

## 2020-03-01 NOTE — Procedures (Signed)
   NAME: Angela Hart DATE OF BIRTH:  June 30, 1950 MEDICAL RECORD NUMBER [id number]  LOCATION: [facility]  PHYSICIAN: Marius Ditch  DATE OF STUDY: 02/29/2020  SLEEP STUDY TYPE: Nocturnal Polysomnogram               REFERRING PHYSICIAN: Marius Ditch, MD  EPWORTH SLEEPINESS SCORE:  16 HEIGHT: 5' 6.5" (168.9 cm)  WEIGHT: 142 lb (64.4 kg)    Body mass index is 22.58 kg/m.  NECK SIZE: 14 in.  CLINICAL INFORMATION The patient was referred to the sleep center for evaluation of excessive daytime sleepiness that is life-altering. Although this started later in life, she did have several concussions after falls while horseback riding. She has had a HSAT that showed very mild OSA. This test done prior to planned MSLT.   MEDICATIONS Patient self administered medications include: none. No sleep medications administered during study.  SLEEP STUDY TECHNIQUE A multi-channel overnight Polysomnography study was performed. The channels recorded and monitored were central and occipital EEG, electrooculogram (EOG), submentalis EMG (chin), nasal and oral airflow, thoracic and abdominal wall motion, anterior tibialis EMG, snore microphone, electrocardiogram, and a pulse oximetry.  TECHNICAL COMMENTS Comments added by Technician: ONE REST ROOM VISIT Comments added by Scorer: N/A  SLEEP ARCHITECTURE The study was initiated at 10:30:23 PM and terminated at 6:06:23 AM. The total recorded time was 456 minutes. EEG confirmed total sleep time was 380 minutes yielding a sleep efficiency of 83.3%%. Sleep onset after lights out was 20.7 minutes with a REM latency of 178.0 minutes. The patient spent 4.6%% of the night in stage N1 sleep, 73.4%% in stage N2 sleep, 1.1%% in stage N3 and 20.9% in REM. Wake after sleep onset (WASO) was 55.3 minutes. The Arousal Index was 19.7/hour.  RESPIRATORY PARAMETERS There were a total of 6 respiratory disturbances out of which 1 were apneas ( 0  obstructive, 0 mixed, 1 central) and 5 hypopneas. The apnea/hypopnea index (AHI) was 0.9 events/hour. The central sleep apnea index was 0.2 events/hour. The REM AHI was 2.3 events/hour and NREM AHI was 0.6 events/hour. The supine AHI was 2.7 events/hour and the non supine AHI was 0.6 events/hour. Respiratory disturbance index (RDI) was 3.9 events/hour; REM RDI was 4.5 events/hour. Respiratory disturbances were associated with oxygen desaturation down to a nadir of 88.0% during sleep. The mean oxygen saturation during the study was 95.1%. The cumulative time under 88% oxygen saturation was 0 minutes.  LEG MOVEMENT DATA The total leg movements were 0 with a resulting leg movement index of 0.0/hr . Associated arousal with leg movement index was 0.0/hr.  CARDIAC DATA The underlying cardiac rhythm was most consistent with sinus rhythm. Mean heart rate during sleep was 62.0 bpm. Additional rhythm abnormalities include None.  IMPRESSIONS - No Significant Obstructive Sleep apnea(OSA) - No Significant Upper Airway Resistance Syndrome(UARS). - No significant periodic leg movements(PLMs) during sleep.   DIAGNOSIS - Excessive daytime sleepiness  RECOMMENDATIONS - May proceed with MSLT  Marius Ditch Sleep specialist, American Board of Internal Medicine  ELECTRONICALLY SIGNED ON:  03/01/2020, 8:44 PM [place] PH: [phone]   FX: [phone] [place]

## 2020-03-02 ENCOUNTER — Other Ambulatory Visit (HOSPITAL_BASED_OUTPATIENT_CLINIC_OR_DEPARTMENT_OTHER): Payer: Self-pay

## 2020-03-02 DIAGNOSIS — G4710 Hypersomnia, unspecified: Secondary | ICD-10-CM

## 2020-03-23 ENCOUNTER — Ambulatory Visit (INDEPENDENT_AMBULATORY_CARE_PROVIDER_SITE_OTHER): Payer: Medicare Other | Admitting: *Deleted

## 2020-03-23 ENCOUNTER — Other Ambulatory Visit: Payer: Self-pay

## 2020-03-23 DIAGNOSIS — L570 Actinic keratosis: Secondary | ICD-10-CM

## 2020-03-23 MED ORDER — AMINOLEVULINIC ACID HCL 10 % EX GEL
2000.0000 mg | Freq: Once | CUTANEOUS | Status: AC
  Administered 2020-03-23: 2000 mg via TOPICAL

## 2020-03-23 NOTE — Progress Notes (Signed)
Photodynamic Therapy Procedure Note Diagnosis: Actinic keratosis Location: face Informed Consent: Discussed risks (burning, pain, redness, peeling, severe sunburn-like reaction, blistering, discoloration, lack of resolution) and benefits of the procedure, as well as the alternatives. Informed consent was obtained. Preparation: After cleansing the skin, the area to be treated was coated with Levulan.  This was allowed to sit on the skin for 1.5 hours. Procedure Details: The patient was placed under the light source with appropriate eye protection for 16 minutes and 42 seconds.  minutes. After completing the thereatment, the patient applied sunscreen to the treated areas.  Plan: Avoid any sun exposure for the next 24 hours. Wear sunscreen daily for the next week. Observe normal sun precautions thereafter. Recommend OTC analgesia as needed for pain. Follow-up in 10-12  weeks.

## 2020-03-23 NOTE — Patient Instructions (Signed)

## 2020-03-25 ENCOUNTER — Ambulatory Visit: Payer: Medicare Other | Admitting: Cardiology

## 2020-03-25 ENCOUNTER — Other Ambulatory Visit: Payer: Self-pay

## 2020-03-25 ENCOUNTER — Encounter: Payer: Self-pay | Admitting: Cardiology

## 2020-03-25 VITALS — BP 136/64 | HR 51 | Temp 97.9°F | Resp 16 | Ht 66.5 in | Wt 145.6 lb

## 2020-03-25 DIAGNOSIS — I201 Angina pectoris with documented spasm: Secondary | ICD-10-CM

## 2020-03-25 DIAGNOSIS — I7300 Raynaud's syndrome without gangrene: Secondary | ICD-10-CM

## 2020-03-25 DIAGNOSIS — E7800 Pure hypercholesterolemia, unspecified: Secondary | ICD-10-CM

## 2020-03-25 NOTE — Progress Notes (Signed)
Primary Physician/Referring:  [name], MD  Patient ID: Angela Hart, female    DOB: 02-03-1951, 70 y.o.   MRN: [id number]  Chief Complaint  Patient presents with  . Prinzmetals Angina  . Raynaud's disease  . Follow-up    1 year   HPI:    Angela Hart  is a 70 y.o. female with Lynch syndrome underwent prophylactic hysterectomy- for cancer (Lynch Syndrome: Colon cancer and Uterus and  less likely other organs). She has had partial colectomy for colon cancer. Also has printzemal angina diagnosed in 2014, hyperlipidemia, and family history of early CAD.   She was told to have Printzemal angina in 2014. Chest discomfort that radiates up to her jaw and mostly noted during routine activity or rest.  She has not been able to tolerate any other statins, states that Nexletol caused her to have gout.  She has not had any recurrence of angina this year.  However she is also having more Raynaud's phenomenon and wears warm gloves.   Past Medical History:  Diagnosis Date  . Abdominal pain   . Allergy   . Anemia   . Anxiety   . Arthritis    hands/hip, gout left ankle  . Asthma    lungs damaged by chemo - uses inhaler for "chemo toxicity "  . Atypical nevus 10/04/2003   slight-mod-left post axilla  . Blood transfusion 03/2010   at [facility]  . Cataract   . Colon cancer ([place]) 03/2010  . Concussion    per pt, has had 2-3 concussions due to horse injuries  . Depression   . Fatigue   . GERD (gastroesophageal reflux disease)    occasional  . Heart murmur    hx -no problems  . Hyperlipidemia   . Interstitial cystitis   . Lactose intolerance   . Lynch syndrome 12/22/2011  . Major depression    clinical- sees leslie brown  . MVP (mitral valve prolapse)    mild-seen on echo 2008  . Neuromuscular disorder ([place])    raynaud's disease hands/feet  . Neuropathy   . Obstructive sleep apnea    very mild  . Osteoarthritis   . Osteoporosis   . Pelvic fracture ([place])    while riding a  horse in 2008  . Peripheral neuropathy    mild- due to chemotherapy  . Prinzmetal angina ([place])   . Raynaud's syndrome    hands/feet  . Serrated adenoma of colon   . Squamous cell carcinoma of skin 10/27/2014   KA- right inner shin,ant  (txpbx), in situ- left top shin (txpbx)  . SVD (spontaneous vaginal delivery)    x 1  . Thyroid disease    Past Surgical History:  Procedure Laterality Date  . ABDOMINO-VAGINAL VESICAL NECK SUSPENSION  10/2018  . BUNIONECTOMY     Bil/ hammer toe on right foot  . COLONOSCOPY    . COLONOSCOPY WITH PROPOFOL N/A 06/10/2012   Procedure: COLONOSCOPY WITH PROPOFOL;  Surgeon: Garlan Fair, MD;  Location: [facility];  Service: Endoscopy;  Laterality: N/A;  . COLONOSCOPY WITH PROPOFOL N/A 09/22/2013   Procedure: COLONOSCOPY WITH PROPOFOL;  Surgeon: Garlan Fair, MD;  Location: [facility];  Service: Endoscopy;  Laterality: N/A;  . COLONOSCOPY WITH PROPOFOL N/A 08/02/2015   Procedure: COLONOSCOPY WITH PROPOFOL;  Surgeon: Garlan Fair, MD;  Location: [facility];  Service: Endoscopy;  Laterality: N/A;  . ESOPHAGOGASTRODUODENOSCOPY (EGD) WITH PROPOFOL N/A 06/10/2012   Procedure: ESOPHAGOGASTRODUODENOSCOPY (EGD) WITH PROPOFOL;  Surgeon:  Garlan Fair, MD;  Location: [facility];  Service: Endoscopy;  Laterality: N/A;  . ESOPHAGOGASTRODUODENOSCOPY (EGD) WITH PROPOFOL N/A 09/22/2013   Procedure: ESOPHAGOGASTRODUODENOSCOPY (EGD) WITH PROPOFOL;  Surgeon: Garlan Fair, MD;  Location: [facility];  Service: Endoscopy;  Laterality: N/A;  . FOOT NEUROMA SURGERY Right   . interstem  12/2018  . MENISCUS REPAIR Right    for meniscus tear  . NASAL SINUS SURGERY  1993  . OVARIAN CYST REMOVAL     Laparotomy  . PARTIAL COLECTOMY  04/12/2010  . POLYPECTOMY    . port a cath insertion  2012   for chemo use  . PORT-A-CATH REMOVAL  05/03/2011  . RETINAL TEAR REPAIR CRYOTHERAPY Right   . TOOTH EXTRACTION     molars  . TOTAL ABDOMINAL HYSTERECTOMY W/ BILATERAL  SALPINGOOPHORECTOMY  2013  . UPPER GASTROINTESTINAL ENDOSCOPY    . VARICOSE VEIN SURGERY Bilateral    Social History   Tobacco Use  . Smoking status: Never Smoker  . Smokeless tobacco: Never Used  . Tobacco comment: high school years 55 - tried  to smoke and did not take or like it   Substance Use Topics  . Alcohol use: No    ROS  Review of Systems  Constitutional: Negative for weight gain.  Cardiovascular: Negative for chest pain, dyspnea on exertion, leg swelling and syncope.  Respiratory: Negative for hemoptysis.   Endocrine: Negative for cold intolerance.  Hematologic/Lymphatic: Does not bruise/bleed easily.  Musculoskeletal: Positive for arthritis.  Gastrointestinal: Negative for hematochezia and melena.  Neurological: Negative for headaches and light-headedness.   Objective  Blood pressure 136/64, pulse (!) 51, temperature 97.9 F (36.6 C), resp. rate 16, height 5' 6.5" (1.689 m), weight 145 lb 9.6 oz (66 kg), SpO2 96 %.  Vitals with BMI 03/25/2020 02/29/2020 10/05/2019  Height 5' 6.5" 5' 6.5" -  Weight 145 lbs 10 oz 142 lbs -  BMI [id number] -  Systolic [id number] - [id number]  Diastolic 64 - 60  Pulse 51 - 61     Physical Exam Constitutional:      Appearance: She is well-developed.  Neck:     Thyroid: No thyromegaly.  Cardiovascular:     Rate and Rhythm: Normal rate and regular rhythm.     Pulses: Intact distal pulses.     Heart sounds: Normal heart sounds. No murmur heard. No gallop.      Comments: No leg edema, no JVD. Pulmonary:     Effort: Pulmonary effort is normal.     Breath sounds: Normal breath sounds.  Abdominal:     General: Bowel sounds are normal.     Palpations: Abdomen is soft.  Musculoskeletal:     Cervical back: Neck supple.  Skin:    General: Skin is warm and dry.    Laboratory examination:   External labs:  Lab 10/05/2019:  TSH normal.  A1c 5.6%.  Hb 12.6/HCT 36.7, platelets 389.  Total cholesterol 254, triglycerides 144, HDL 94, LDL  132, non-HDL cholesterol 159.  Medications and allergies  No Known Allergies  Current Outpatient Medications on File Prior to Visit  Medication Sig Dispense Refill  . Acetylcysteine (NAC 600 PO) Take 600 mg by mouth daily.    . Ascorbic Acid (VITAMIN C) 1000 MG tablet Take 1,000 mg by mouth 2 (two) times daily.    . ASTAXANTHIN PO Take 6 mg by mouth daily.    Marland Kitchen augmented betamethasone dipropionate (DIPROLENE-AF) 0.05 % cream Apply topically daily. 50 g 1  .  buPROPion (WELLBUTRIN XL) 300 MG 24 hr tablet Take 300 mg by mouth daily before breakfast.    . Calcium Carb-Cholecalciferol 500-100 MG-UNIT CHEW Chew by mouth daily.    . calcium carbonate (OS-CAL) 1250 (500 Ca) MG chewable tablet Chew 1 tablet by mouth daily as needed.     . Cholecalciferol (VITAMIN D) 2000 UNITS tablet Take 2,000 Units by mouth 2 (two) times a day.     . Coenzyme Q10 (CO Q 10) 100 MG CAPS Take 100 mg by mouth daily.    . Cyanocobalamin (VITAMIN B-12) 2500 MCG SUBL Place 2,500 mcg under the tongue daily.     . cycloSPORINE (RESTASIS OP) Apply to eye. Put one drop in both eyes twice a day    . dicyclomine (BENTYL) 10 MG/5ML solution Take by mouth.    . DULoxetine (CYMBALTA) 60 MG capsule Take 1 capsule by mouth every morning.    . fluticasone (FLONASE) 50 MCG/ACT nasal spray Place 1 spray into both nostrils daily.    . Glucosamine-Chondroit-Vit C-Mn (GLUCOSAMINE CHONDR 1500 COMPLX PO) Take 1 capsule by mouth 2 (two) times daily.     Marland Kitchen ibuprofen (ADVIL) 600 MG tablet Take 600 mg by mouth every 6 (six) hours as needed.    Marland Kitchen ibuprofen (ADVIL,MOTRIN) 200 MG tablet Take 200 mg by mouth every 6 (six) hours as needed for moderate pain.    Marland Kitchen levothyroxine (SYNTHROID) 100 MCG tablet Take 100 mcg by mouth every other day. With 88 mcg on opposing days    . levothyroxine (SYNTHROID) 88 MCG tablet Take 88 mcg by mouth every morning.    . Loperamide HCl 1 MG/7.5ML LIQD Take by mouth.    . Magnesium 200 MG TABS Take 400 mg by mouth  daily.     . metoCLOPramide (REGLAN) 10 MG tablet Take 1 tablet (10 mg total) by mouth as directed. 2 tablet 0  . Multiple Vitamin (MULTIVITAMIN WITH MINERALS) TABS Take 1 tablet by mouth daily.    Marland Kitchen NATTOKINASE PO Take 1 capsule by mouth daily.    . nitroGLYCERIN (NITROSTAT) 0.4 MG SL tablet Place 1 tablet on the tongue ever 5 minutes for max 3 tabs in 15 mins. Call 911 if no relief. 25 tablet 3  . oxyCODONE (OXY IR/ROXICODONE) 5 MG immediate release tablet Take 5 mg by mouth 2 (two) times daily as needed.    Vladimir Faster Glycol-Propyl Glycol 0.4-0.3 % SOLN Apply 1 drop to eye 3 (three) times daily as needed (dry eyes).    . Probiotic Product (VISBIOME HIGH POTENCY) CAPS Take 1 tablet by mouth daily.    Marland Kitchen PROVIGIL 100 MG tablet Take 100 mg by mouth every morning.    . RESTASIS 0.05 % ophthalmic emulsion     . tacrolimus (PROTOPIC) 0.1 % ointment Apply 1 application topically at bedtime. Applies for dermatitis.    . Vitamin K, Phytonadione, 100 MCG TABS Take 100 mcg by mouth daily.    . White Petrolatum-Mineral Oil (TEARS AGAIN) OINT Apply to eye.    Marland Kitchen XIIDRA 5 % SOLN      Current Facility-Administered Medications on File Prior to Visit  Medication Dose Route Frequency Provider Last Rate Last Admin  . 0.9 %  sodium chloride infusion  500 mL Intravenous Once Pyrtle, Lajuan Lines, MD         Radiology:  No results found.  Cardiac Studies:   Exercise Sestamibi Stress Test 07/14/2018 1. The resting electrocardiogram demonstrated normal sinus rhythm, normal resting conduction and  normal rest repolarization.  The stress electrocardiogram was positive for ischemia with 2 mm horizontal to up sloping ST depression at peak exercise persisting for >3 min into recovery. No arrhythmias. Normal BP response. Patient exercised on Bruce protocol for 8:29 minutes and achieved 10.16 METS. Stress test terminated due to dyspnea and 88% MPHR achieved (Target HR >85%).  2. Myocardial perfusion imaging is normal. Overall  left ventricular systolic function was normal without regional wall motion abnormalities. The left ventricular ejection fraction was 73%.   3.  This is a low risk study, clinical correlation recommended.  Echocardiogram 05/30/2018: Left ventricle cavity is normal in size. Normal global wall motion. Doppler evidence of grade I (impaired) diastolic dysfunction, normal LAP. Calculated EF 55%. Mild (Grade I) mitral regurgitation. No evidence of pulmonary hypertension.   Stress EKG 11/08/11: Negative for ischemia. 8 min. 10 METs. 2 mm ST dep back to baseline < 2 min. Dyspnea present.  EKG:    EKG 03/25/2020: Normal sinus rhythm at rate of 74 bpm, normal axis.  No evidence of ischemia, normal EKG. no significant change from prior EKG, minimal inferior ST depression no longer present.  Assessment     ICD-10-CM   1. Prinzmetal angina (HCC)  I20.1 EKG 12-Lead    No orders of the defined types were placed in this encounter.   There are no discontinued medications.   Recommendations:   Angela Hart  is a 70 y.o. female with Lynch syndrome underwent prophylactic hysterectomy- for cancer (Lynch Syndrome: Colon cancer and Uterus and  less likely other organs). She has had partial colectomy for colon cancer. Also has printzemal angina diagnosed in 2014, hyperlipidemia, OSA and family history of early CAD. She does not want to be on statins.   She does have risk factors with hyperlipidemia, raynaud's, and family history of early CAD.  She has not been able to tolerate any other statins, states that Nexletol caused her to have gout.  She has not had any recurrence of angina this year.  Advised her she could try red yeast rice or even vinegar to see if her lipids would improve.  I reviewed her external labs, LDL is minimally elevated.  As she remains stable from cardiac standpoint, physical examination and EKG are normal, and she has not had any recurrence of angina, I will see her back on a as  needed basis.   Adrian Prows, MD, [facility] 03/25/2020, 9:34 AM Office: [phone] Pager: [phone]

## 2020-06-15 ENCOUNTER — Encounter: Payer: Self-pay | Admitting: Physician Assistant

## 2020-06-15 ENCOUNTER — Other Ambulatory Visit: Payer: Self-pay

## 2020-06-15 ENCOUNTER — Ambulatory Visit (INDEPENDENT_AMBULATORY_CARE_PROVIDER_SITE_OTHER): Payer: Medicare Other | Admitting: Physician Assistant

## 2020-06-15 DIAGNOSIS — L239 Allergic contact dermatitis, unspecified cause: Secondary | ICD-10-CM

## 2020-06-15 DIAGNOSIS — I201 Angina pectoris with documented spasm: Secondary | ICD-10-CM | POA: Diagnosis not present

## 2020-06-15 NOTE — Progress Notes (Signed)
    Follow-Up Visit   Subjective  Angela Hart is a 70 y.o. female who presents for the following:  Follow-up Here for Pdt follow up on face. Patient states she had a good reaction to the light therapy.  Patient states she is panicking about lesions that are basal cell all over her body. Records reviewed history of non mole skin cancers.   Her rash occurs mostly in the middle of the summer.( every summer) She thinks it is exacerbated by the sun. . She has numerous dogs for years. This is most likely not related to her [facility] in the past. It was severe and she was miserable.  According to her, she did not have any systemic complaints at the time. She used triamcinalone and anti-histamines and it did subside.  Records review did reveal some anemia and hypercholesterolemia. I was unable to find a current TSH evaluation.    The following portions of the chart were reviewed this encounter and updated as appropriate:  Tobacco  Allergies  Meds  Problems  Med Hx  Surg Hx  Fam Hx      Objective  Well appearing patient in no apparent distress; mood and affect are within normal limits.  A full examination was performed including scalp, head, eyes, ears, nose, lips, neck, chest, axillae, abdomen, back, buttocks, bilateral upper extremities, bilateral lower extremities, hands, feet, fingers, toes, fingernails, and toenails. All findings within normal limits unless otherwise noted below.  Objective  Chest - Medial [facility]): Completely clear today with slight dermatographism. No whelps. No lymphadenopathy.   Assessment & Plan  Allergic dermatitis Chest - Medial (Center), legs, shoulders  Ambulatory referral to Allergy - Chest - Medial [facility]), legs, shoulders  Rule out sun allergy vs others.  I, Braylynn Lewing, PA-C, have reviewed all documentation's for this visit.  The documentation on 07/06/20 for the exam, diagnosis, procedures and orders are all accurate and complete.

## 2020-07-19 ENCOUNTER — Telehealth: Payer: Self-pay | Admitting: Physician Assistant

## 2020-07-19 NOTE — Telephone Encounter (Signed)
Patient left message on office voice mail that seborrheic dermatitis has spread to her scalp.  She has tried a few shampoos as well as Ketoconazole, but they have not helped.  What does she need to do?  Patient uses [facility] on [address].

## 2020-07-20 NOTE — Telephone Encounter (Signed)
Ciclopirox solution qhs 3-5x per week////3 rf

## 2020-07-21 MED ORDER — CICLOPIROX OLAMINE 0.77 % EX SUSP: 1.0000 "application " | Freq: Two times a day (BID) | CUTANEOUS | 3 refills | Status: DC

## 2020-07-21 NOTE — Telephone Encounter (Signed)
Phone call to patient to let her know that we called in another medication for her scalp. [facility] [place]. Ok per [employer].

## 2020-07-26 ENCOUNTER — Telehealth: Payer: Self-pay | Admitting: Physician Assistant

## 2020-07-26 MED ORDER — CICLOPIROX OLAMINE 0.77 % EX SUSP: 1.0000 "application " | Freq: Two times a day (BID) | CUTANEOUS | 3 refills | Status: DC

## 2020-07-26 NOTE — Telephone Encounter (Signed)
Patient calling concerned about the amount of medication in the bottle. She states that she does have refills but there is no way one bottle is going to last her a month because of the large area on top of her scalp that she is treating 2 times per day. She asked if this medication comes in a larger amount?

## 2020-07-26 NOTE — Telephone Encounter (Signed)
Patient insurance wont cover rx- so patient was wanting new prescription sent to [facility] to use goodrx.

## 2020-08-16 ENCOUNTER — Other Ambulatory Visit: Payer: Self-pay

## 2020-08-16 ENCOUNTER — Encounter: Payer: Self-pay | Admitting: Allergy and Immunology

## 2020-08-16 ENCOUNTER — Ambulatory Visit (INDEPENDENT_AMBULATORY_CARE_PROVIDER_SITE_OTHER): Payer: Medicare Other | Admitting: Allergy and Immunology

## 2020-08-16 VITALS — BP 122/80 | HR 75 | Temp 97.5°F | Resp 16 | Ht 66.5 in | Wt 142.8 lb

## 2020-08-16 DIAGNOSIS — L989 Disorder of the skin and subcutaneous tissue, unspecified: Secondary | ICD-10-CM | POA: Diagnosis not present

## 2020-08-16 DIAGNOSIS — I201 Angina pectoris with documented spasm: Secondary | ICD-10-CM

## 2020-08-16 DIAGNOSIS — B360 Pityriasis versicolor: Secondary | ICD-10-CM | POA: Diagnosis not present

## 2020-08-16 DIAGNOSIS — L299 Pruritus, unspecified: Secondary | ICD-10-CM | POA: Diagnosis not present

## 2020-08-16 MED ORDER — FLUCONAZOLE 150 MG PO TABS: ORAL_TABLET | ORAL | 1 refills | Status: DC

## 2020-08-16 MED ORDER — CETIRIZINE HCL 10 MG PO TABS: 10.0000 mg | ORAL_TABLET | Freq: Two times a day (BID) | ORAL | 5 refills | Status: DC

## 2020-08-16 NOTE — Progress Notes (Signed)
[name]   Dear Angela Hart,  Thank you for referring Angela Hart to the [facility] on 08/16/2020.   Below is a summation of this patient's evaluation and recommendations.  Thank you for your referral. I will keep you informed about this patient's response to treatment.   If you have any questions please do not hesitate to contact me.   Sincerely,  Angela Prows, MD Allergy / Immunology [place] [facility]   ______________________________________________________________________    NEW PATIENT NOTE  Referring Provider: Starlyn Hart Primary Provider: [name], MD Date of office visit: 08/16/2020    Subjective:   Chief Complaint:  Angela Hart (DOB: 1950/04/13) is a 70 y.o. female who presents to the clinic on 08/16/2020 with a chief complaint of Allergic Rhinitis  (States itching, skin lesions that happen more during the summer months.) .     HPI: Angela Hart presents to this clinic in evaluation of summertime dermatosis.  She has a very complex cutaneous history with multiple skin infections including squamous cell and dysplastic nevi and seborrheic keratoses and actinic keratoses and contact dermatitis to various plastics.    The big issue for Angela Hart is every summer for the past 8 summers or so she has developed intense pruritic dermatitis that she describes as a circular lesions that develop as a crop across her extremities and trunk that is intensely red that she excoriates to the point where she rips off the skin and ulcerates the skin.  She will end up developing very significant patches of inflamed and irritated and excoriated skin.  She never has any associated systemic or constitutional symptoms with this issue.  It should be noted that she applies 5-FU to the skin lesions as soon as they start.  She has been  using 5-FU for actinic keratoses in the past.  Past Medical History:  Diagnosis Date  . Abdominal pain   . Allergy   . Anemia   . Anxiety   . Arthritis    hands/hip, gout left ankle  . Atypical nevus 10/04/2003   slight-mod-left post axilla  . Blood transfusion 03/2010   at [facility]  . Cataract   . Colon cancer ([place]) 03/2010  . Concussion    per pt, has had 2-3 concussions due to horse injuries  . Depression   . Fatigue   . GERD (gastroesophageal reflux disease)    occasional  . Heart murmur    hx -no problems  . Hyperlipidemia   . Interstitial cystitis   . Lactose intolerance   . Lynch syndrome 12/22/2011  . Major depression    clinical- sees leslie brown  . MVP (mitral valve prolapse)    mild-seen on echo 2008  . Neuromuscular disorder ([place])    raynaud's disease hands/feet  . Neuropathy   . Obstructive sleep apnea    very mild  . Osteoarthritis   . Osteoporosis   . Pelvic fracture ([place])    while riding a horse in 2008  . Peripheral neuropathy    mild- due to chemotherapy  . Prinzmetal angina ([place])   . Raynaud's syndrome    hands/feet  . Serrated adenoma of colon   . Squamous cell carcinoma of skin 10/27/2014   [facility]- right inner shin,ant  (txpbx), in situ- left top shin (txpbx)  . SVD (spontaneous vaginal delivery)    x 1  .  Thyroid disease     Past Surgical History:  Procedure Laterality Date  . ABDOMINO-VAGINAL VESICAL NECK SUSPENSION  10/2018  . BUNIONECTOMY     Bil/ hammer toe on right foot  . COLONOSCOPY    . COLONOSCOPY WITH PROPOFOL N/A 06/10/2012   Procedure: COLONOSCOPY WITH PROPOFOL;  Surgeon: Garlan Fair, MD;  Location: [facility];  Service: Endoscopy;  Laterality: N/A;  . COLONOSCOPY WITH PROPOFOL N/A 09/22/2013   Procedure: COLONOSCOPY WITH PROPOFOL;  Surgeon: Garlan Fair, MD;  Location: [facility];  Service: Endoscopy;  Laterality: N/A;  . COLONOSCOPY WITH PROPOFOL N/A 08/02/2015   Procedure: COLONOSCOPY WITH PROPOFOL;  Surgeon: Garlan Fair, MD;  Location: [facility];  Service: Endoscopy;  Laterality: N/A;  . ESOPHAGOGASTRODUODENOSCOPY (EGD) WITH PROPOFOL N/A 06/10/2012   Procedure: ESOPHAGOGASTRODUODENOSCOPY (EGD) WITH PROPOFOL;  Surgeon: Garlan Fair, MD;  Location: [facility];  Service: Endoscopy;  Laterality: N/A;  . ESOPHAGOGASTRODUODENOSCOPY (EGD) WITH PROPOFOL N/A 09/22/2013   Procedure: ESOPHAGOGASTRODUODENOSCOPY (EGD) WITH PROPOFOL;  Surgeon: Garlan Fair, MD;  Location: [facility];  Service: Endoscopy;  Laterality: N/A;  . FOOT NEUROMA SURGERY Right   . interstem  12/2018  . MENISCUS REPAIR Right    for meniscus tear  . NASAL SINUS SURGERY  1993  . OVARIAN CYST REMOVAL     Laparotomy  . PARTIAL COLECTOMY  04/12/2010  . POLYPECTOMY    . port a cath insertion  2012   for chemo use  . PORT-A-CATH REMOVAL  05/03/2011  . RETINAL TEAR REPAIR CRYOTHERAPY Right   . TOOTH EXTRACTION     molars  . TOTAL ABDOMINAL HYSTERECTOMY W/ BILATERAL SALPINGOOPHORECTOMY  2013  . UPPER GASTROINTESTINAL ENDOSCOPY    . VARICOSE VEIN SURGERY Bilateral     Allergies as of 08/16/2020      Reactions   Atorvastatin    Other reaction(s): myalgias   Lactose    Other reaction(s): bloated   Other    Other reaction(s): rash   Pitavastatin    Other reaction(s): myalgias   Simvastatin    Other reaction(s): myalgias      Medication List    ASTAXANTHIN PO Take 6 mg by mouth daily.   augmented betamethasone dipropionate 0.05 % cream Commonly known as: DIPROLENE-AF Apply topically daily.   buPROPion 300 MG 24 hr tablet Commonly known as: WELLBUTRIN XL Take 300 mg by mouth daily before breakfast.   Calcium Carb-Cholecalciferol 500-100 MG-UNIT Chew Chew by mouth daily.   calcium carbonate 1250 (500 Ca) MG chewable tablet Commonly known as: OS-CAL Chew 1 tablet by mouth daily as needed.   ciclopirox 0.77 % Susp Commonly known as: LOPROX Apply 1 application topically 2 (two) times daily.   Co Q 10 100 MG  Caps Take 100 mg by mouth daily.   dicyclomine 10 MG/5ML solution Commonly known as: BENTYL Take by mouth.   DULoxetine 60 MG capsule Commonly known as: CYMBALTA Take 1 capsule by mouth every morning.   fluticasone 50 MCG/ACT nasal spray Commonly known as: FLONASE Place 1 spray into both nostrils daily.   GLUCOSAMINE CHONDR 1500 COMPLX PO Take 1 capsule by mouth 2 (two) times daily.   ibuprofen 200 MG tablet Commonly known as: ADVIL Take 200 mg by mouth every 6 (six) hours as needed for moderate pain.   levothyroxine 100 MCG tablet Commonly known as: SYNTHROID Take 100 mcg by mouth every other day. With 88 mcg on opposing days   levothyroxine 88 MCG tablet Commonly known as: SYNTHROID  Take 88 mcg by mouth every morning.   Magnesium 200 MG Tabs Take 400 mg by mouth daily.   metoCLOPramide 10 MG tablet Commonly known as: Reglan Take 1 tablet (10 mg total) by mouth as directed.   modafinil 100 MG tablet Commonly known as: PROVIGIL Take 100 mg by mouth in the morning and at bedtime. 2 tablet (200 mg) in the morning and 1 tablet 100 mg at afternoon   multivitamin with minerals Tabs tablet Take 1 tablet by mouth daily.   Polyethyl Glycol-Propyl Glycol 0.4-0.3 % Soln Apply 1 drop to eye 3 (three) times daily as needed (dry eyes).   RESTASIS OP Apply to eye. Put one drop in both eyes twice a day   Restasis 0.05 % ophthalmic emulsion Generic drug: cycloSPORINE   tacrolimus 0.1 % ointment Commonly known as: PROTOPIC Apply 1 application topically at bedtime. Applies for dermatitis.   Visbiome High Potency Caps Take 1 tablet by mouth daily.   Vitamin B-12 2500 MCG Subl Place 2,500 mcg under the tongue daily.   vitamin C 1000 MG tablet Take 1,000 mg by mouth 2 (two) times daily.   Vitamin D 50 MCG (2000 UT) tablet Take 2,000 Units by mouth 2 (two) times a day.       Review of systems negative except as noted in HPI / PMHx or noted below:  Review of Systems   Constitutional: Negative.   HENT: Negative.   Eyes: Negative.   Respiratory: Negative.   Cardiovascular: Negative.   Gastrointestinal: Negative.   Genitourinary: Negative.   Musculoskeletal: Negative.   Skin: Negative.   Neurological: Negative.   Endo/Heme/Allergies: Negative.   Psychiatric/Behavioral: Negative.     Family History  Problem Relation Age of Onset  . Heart disease Mother   . Heart attack Mother   . Heart disease Father   . Heart attack Father   . Heart disease Brother   . Heart attack Brother   . Other Brother        Lynch syndrome-PMS2  . Cancer - Other Brother   . Colon polyps Brother   . Other Brother        Lynch syndrome-PMS2  . Sleep apnea Brother   . Other Son        lynch syndrome-PMS2  . Colon polyps Son   . Heart disease Maternal Grandmother   . Heart attack Maternal Grandmother   . Heart disease Maternal Grandfather   . Heart attack Maternal Grandfather   . Alzheimer's disease Paternal Grandmother   . Emphysema Paternal Grandfather   . Colon cancer Neg Hx   . Esophageal cancer Neg Hx   . Stomach cancer Neg Hx   . Rectal cancer Neg Hx     Social History   Socioeconomic History  . Marital status: Married    Spouse name: Not on file  . Number of children: 1  . Years of education: Not on file  . Highest education level: Not on file  Occupational History  . Occupation: retired Pharmacist, hospital  Tobacco Use  . Smoking status: Never Smoker  . Smokeless tobacco: Never Used  . Tobacco comment: high school years 64 - tried  to smoke and did not take or like it   Vaping Use  . Vaping Use: Never used  Substance and Sexual Activity  . Alcohol use: No  . Drug use: No  . Sexual activity: Yes    Birth control/protection: Post-menopausal  Other Topics Concern  . Not on file  Social History Narrative  . Not on file   Environmental and Social history  Lives in a house with a dry environment, bird and dog located inside the household, hardwood  in the bedroom, no plastic on the bed, no plastic on the pillow, no smoking ongoing with inside the household.  Objective:   Vitals:   08/16/20 0944  BP: 122/80  Pulse: 75  Resp: 16  Temp: (!) 97.5 F (36.4 C)  SpO2: 98%   Height: 5' 6.5" (168.9 cm) Weight: 142 lb 12.8 oz (64.8 kg)  Physical Exam Constitutional:      Appearance: She is not diaphoretic.  HENT:     Head: Normocephalic.     Right Ear: Tympanic membrane, ear canal and external ear normal.     Left Ear: Tympanic membrane, ear canal and external ear normal.     Nose: Nose normal. No mucosal edema or rhinorrhea.     Mouth/Throat:     Pharynx: Uvula midline. No oropharyngeal exudate.  Eyes:     Conjunctiva/sclera: Conjunctivae normal.  Neck:     Thyroid: No thyromegaly.     Trachea: Trachea normal. No tracheal tenderness or tracheal deviation.  Cardiovascular:     Rate and Rhythm: Normal rate and regular rhythm.     Heart sounds: Normal heart sounds, S1 normal and S2 normal. No murmur heard.   Pulmonary:     Effort: No respiratory distress.     Breath sounds: Normal breath sounds. No stridor. No wheezing or rales.  Lymphadenopathy:     Head:     Right side of head: No tonsillar adenopathy.     Left side of head: No tonsillar adenopathy.     Cervical: No cervical adenopathy.  Skin:    Findings: Rash (Multiple well-healed hyperpigmented scars and seborrheic keratoses and diffuse hyperpigmented macules without urtication across trunk and extremities) present. No erythema.     Nails: There is no clubbing.  Neurological:     Mental Status: She is alert.     Diagnostics: Allergy skin tests were not performed.   Assessment and Plan:    1. Pruritic disorder   2. Inflammatory dermatosis   3. Tinea versicolor     1.  Start fluconazole 150 - 1 tablet EVERY WEEK June-July-August  2.  DO NOT apply 5-FU to skin at onset of flare-up  3. Start Cetirizine 10 mg - 1-2 tablets 1-2 times per day (MAX=90m/day)   IF NEEDED for itching  4. Make sure to use a 70-100 SPF sun block daily during summer.  5. Return to clinic during flare-up  6. Further evaluation???   STerracehas a very unusual cutaneous disorder that develops during the hot sweaty months of the year.  I am going to make the assumption that she is getting a tinea versicolor issue on her skin that gives rise to her intense itching and then she subsequently complicates the issue by the application of 5-FU and excoriation.  I am going to treat her empirically for this condition by having her use 12 doses of fluconazole throughout the summer administered on a weekly basis and she has the option of using cetirizine to raise her itch threshold.  I have asked her to keep in contact with me noting her response to this approach.  EJiles Prows MD Allergy / Immunology [place]

## 2020-08-16 NOTE — Patient Instructions (Addendum)
  1.  Start fluconazole 150 - 1 tablet EVERY WEEK June-July-August  2.  DO NOT apply 5-FU to skin at onset of flare-up  3. Start Cetirizine 10 mg - 1-2 tablets 1-2 times per day (MAX=40mg /day)  IF NEEDED for itching  4. Make sure to use a 70-100 SPF sun block daily during summer.  5. Return to clinic during flare-up  6. Further evaluation???

## 2020-08-17 ENCOUNTER — Encounter: Payer: Self-pay | Admitting: Allergy and Immunology

## 2020-08-17 ENCOUNTER — Telehealth: Payer: Self-pay | Admitting: Allergy and Immunology

## 2020-08-17 NOTE — Telephone Encounter (Signed)
Pt [facility] [place] [facility] off [address]. Pt sates it was sent to [facility] and [facility] cover it there.

## 2020-08-17 NOTE — Telephone Encounter (Signed)
Patient states she spoke to [employer] and the prescription was being filled for more than she needed. This was the reason for the price increase. Patient would like for the prescription to stay with [facility] as she was able to have them fix her issue.

## 2020-09-05 ENCOUNTER — Other Ambulatory Visit: Payer: Self-pay | Admitting: Internal Medicine

## 2020-09-05 DIAGNOSIS — E7800 Pure hypercholesterolemia, unspecified: Secondary | ICD-10-CM

## 2020-09-21 ENCOUNTER — Encounter: Payer: Self-pay | Admitting: Physician Assistant

## 2020-09-21 ENCOUNTER — Ambulatory Visit (INDEPENDENT_AMBULATORY_CARE_PROVIDER_SITE_OTHER): Payer: Medicare Other | Admitting: Physician Assistant

## 2020-09-21 ENCOUNTER — Other Ambulatory Visit: Payer: Self-pay

## 2020-09-21 DIAGNOSIS — I201 Angina pectoris with documented spasm: Secondary | ICD-10-CM

## 2020-09-21 DIAGNOSIS — L309 Dermatitis, unspecified: Secondary | ICD-10-CM | POA: Diagnosis not present

## 2020-09-21 DIAGNOSIS — L219 Seborrheic dermatitis, unspecified: Secondary | ICD-10-CM

## 2020-09-21 MED ORDER — TRIAMCINOLONE ACETONIDE 10 MG/ML IJ SUSP
10.0000 mg | Freq: Once | INTRAMUSCULAR | Status: AC
  Administered 2020-09-21: 10 mg

## 2020-09-21 MED ORDER — LEVOCETIRIZINE DIHYDROCHLORIDE 5 MG PO TABS: 5.0000 mg | ORAL_TABLET | Freq: Every evening | ORAL | 3 refills | Status: DC

## 2020-09-21 NOTE — Progress Notes (Signed)
   Follow-Up Visit   Subjective  Angela Hart is a 70 y.o. female who presents for the following: Follow-up (On the possible sun reaction. Patient did go see the allergist and wants to discuss what they said. [name] has seborrheic dermatitis on scalp using ketoconazole shampoo, clobetasol solution, and fluocinonide solution. None if its working. [name] did give patient fluconazole x 1 a week x 10 weeks because he thinks the skin could possible be yeast/fungal related. ). Patient admits to chronic pruritis with subsequent and constant scratching.    The following portions of the chart were reviewed this encounter and updated as appropriate:  Tobacco  Allergies  Meds  Problems  Med Hx  Surg Hx  Fam Hx       Objective  Well appearing patient in no apparent distress; mood and affect are within normal limits.  All skin waist up examined.    scalp and face Thick scale in scalp with excoriations scattered.   Assessment & Plan  Seborrheic dermatitis scalp and face  Continue treatment with diflucan. Due to hair dryness, use shampoo once weekly.   Intralesional injection - scalp and face Location: scalp  Informed Consent: Discussed risks (infection, pain, bleeding, bruising, thinning of the skin, loss of skin pigment,  Indentation, lack of resolution, and recurrence of lesion) and benefits of the procedure, as well as the alternatives. Informed consent was obtained. Preparation: The area was prepared in a standard fashion.   Procedure Details: An intralesional injection was performed with Kenalog 10 mg/cc. 2 cc in total were injected.  Total number of injections: 10  Plan: The patient was instructed on post-op care. Recommend OTC analgesia as needed for pain.   triamcinolone acetonide (KENALOG) 10 MG/ML injection 10 mg - scalp and face  Temovate prn for pruritis. Attempt to discontinue constant scratching.    Dermatitis  levocetirizine (XYZAL) 5 MG tablet Take 1  tablet (5 mg total) by mouth every evening.  Related Medications augmented betamethasone dipropionate (DIPROLENE-AF) 0.05 % cream Apply topically daily.    I, Tanis Burnley, PA-C, have reviewed all documentation's for this visit.  The documentation on 09/21/20 for the exam, diagnosis, procedures and orders are all accurate and complete.

## 2020-09-22 NOTE — Procedures (Signed)
    NAME: Angela Hart DATE OF BIRTH:  02/06/1951 MEDICAL RECORD NUMBER [id number]  LOCATION: [facility]  PHYSICIAN: Marius Ditch  DATE OF STUDY: 03/01/2020  SLEEP STUDY TYPE: Multiple Sleep Latency Test               REFERRING PHYSICIAN: Marius Ditch, MD  EPWORTH SLEEPINESS SCORE:  NA HEIGHT:    WEIGHT:      There is no height or weight on file to calculate BMI.  NECK SIZE:   in.  CLINICAL INFORMATION The patient was referred to the sleep center for evaluation of life-altering excessive daytime sleepiness.   MEDICATIONS Patient self administered medications include: none. Note that this study was done while the patient is on duloxetine. The patient was informed that this would reduce the likelihood of diagnosing narcolepsy if it is present. However, the patient did not think she could stop duloxetine safely.   SLEEP STUDY TECHNIQUE A multiple sleep latency test was performed. The channels recorded and monitored were central and occipital EEG, electrooculogram (EOG), submentalis EMG (chin), and electrocardiogram.  TECHNICAL COMMENTS Comments added by Technician: None Comments added by Scorer: N/A  IMPRESSIONS - Pathologic sleepiness is suggested by short mean sleep latency of 6:30 minutes - No sleep onset REMs present. This study does not suggest narcolepsy. - Total number of naps attempted: 5 . Total number of naps with sleep attained: 5  DIAGNOSIS - Idiopathic hypersomnia (G47.11)  RECOMMENDATIONS - Return for follow up and management of Idiopathic Hypersomnia, possibly related to history of head trauma.  - Review evaluation for other causes of hypersomnia.  Marius Ditch Sleep specialist, [place] Board of [employer]  ELECTRONICALLY SIGNED ON:  09/22/2020, 8:27 AM [place] PH: [phone]   FX: [phone] [place]

## 2020-09-29 ENCOUNTER — Ambulatory Visit
Admission: RE | Admit: 2020-09-29 | Discharge: 2020-09-29 | Disposition: A | Discharge status: Home / Self Care | Payer: Medicare Other | Source: Ambulatory Visit | Attending: Internal Medicine | Admitting: Internal Medicine

## 2020-09-29 DIAGNOSIS — E7800 Pure hypercholesterolemia, unspecified: Secondary | ICD-10-CM

## 2020-09-30 ENCOUNTER — Encounter: Payer: Self-pay | Admitting: Internal Medicine

## 2020-09-30 ENCOUNTER — Telehealth: Payer: Self-pay | Admitting: Internal Medicine

## 2020-09-30 NOTE — Telephone Encounter (Signed)
Patient called and wanted to follow up with the office for she said its been awhile and has not heard from [place] said she was suppose to get a call to get a test done for her small intestine not sure what the name of test is. I already scheduled her recall colon. Patient awaiting call

## 2020-10-03 NOTE — Telephone Encounter (Signed)
VCE in setting of Lynch syndrome

## 2020-10-03 NOTE — Telephone Encounter (Signed)
Dr. Hilarie Fredrickson see note below. Do you know what test she is referring to?

## 2020-10-04 NOTE — Telephone Encounter (Signed)
Left message for pt to call back. Pt was called last July to schedule VCE and she did not want to schedule at that time, stated she would get back with [place] when she was ready. Left message for her to call back if she is ready to schedule the capsule endoscopy.  Pt scheduled for capsule endo 11/23/20 at 8:30am. Prep instructons sent to pt. Amb ref in epic.

## 2020-10-06 ENCOUNTER — Other Ambulatory Visit: Payer: Self-pay

## 2020-10-06 DIAGNOSIS — Z1509 Genetic susceptibility to other malignant neoplasm: Secondary | ICD-10-CM

## 2020-10-20 ENCOUNTER — Ambulatory Visit (INDEPENDENT_AMBULATORY_CARE_PROVIDER_SITE_OTHER): Payer: Medicare Other | Admitting: Physician Assistant

## 2020-10-20 ENCOUNTER — Other Ambulatory Visit: Payer: Self-pay

## 2020-10-20 ENCOUNTER — Encounter: Payer: Self-pay | Admitting: Physician Assistant

## 2020-10-20 DIAGNOSIS — D485 Neoplasm of uncertain behavior of skin: Secondary | ICD-10-CM

## 2020-10-20 DIAGNOSIS — L308 Other specified dermatitis: Secondary | ICD-10-CM

## 2020-10-20 DIAGNOSIS — I201 Angina pectoris with documented spasm: Secondary | ICD-10-CM | POA: Diagnosis not present

## 2020-10-20 DIAGNOSIS — L928 Other granulomatous disorders of the skin and subcutaneous tissue: Secondary | ICD-10-CM

## 2020-10-20 DIAGNOSIS — L570 Actinic keratosis: Secondary | ICD-10-CM | POA: Diagnosis not present

## 2020-10-20 NOTE — Patient Instructions (Signed)

## 2020-10-25 ENCOUNTER — Encounter: Payer: Self-pay | Admitting: Physician Assistant

## 2020-10-25 NOTE — Progress Notes (Signed)
   Follow-Up Visit   Subjective  Angela Hart is a 70 y.o. female who presents for the following: Cyst (Frontal scalp x months and she stated it was painful). There are numerous lesions. She attempted to use a "drawing salve" and it got severely irritated but persistent.    The following portions of the chart were reviewed this encounter and updated as appropriate:  Tobacco  Allergies  Meds  Problems  Med Hx  Surg Hx  Fam Hx      Objective  Well appearing patient in no apparent distress; mood and affect are within normal limits.  A focused examination was performed including face and scalp. Relevant physical exam findings are noted in the Assessment and Plan.  Left Mid Frontal Scalp  Hyperkeratotic scale with pink base         Mid Frontal Scalp Hyperkeratotic scale with pink base         Assessment & Plan  Neoplasm of uncertain behavior of skin (2) Left Mid Frontal Scalp  Skin / nail biopsy Type of biopsy: tangential   Informed consent: discussed and consent obtained   Timeout: patient name, date of birth, surgical site, and procedure verified   Procedure prep:  Patient was prepped and draped in usual sterile fashion (Non sterile) Prep type:  Chlorhexidine Anesthesia: the lesion was anesthetized in a standard fashion   Anesthetic:  1% lidocaine w/ epinephrine 1-100,000 local infiltration Instrument used: flexible razor blade   Outcome: patient tolerated procedure well   Post-procedure details: wound care instructions given    Specimen 1 - Surgical pathology Differential Diagnosis: R/O BCC vs SCC  Check Margins:Yes  Mid Frontal Scalp  Skin / nail biopsy Type of biopsy: tangential   Informed consent: discussed and consent obtained   Timeout: patient name, date of birth, surgical site, and procedure verified   Anesthesia: the lesion was anesthetized in a standard fashion   Anesthetic:  1% lidocaine w/ epinephrine 1-100,000 local  infiltration Instrument used: flexible razor blade   Hemostasis achieved with: aluminum chloride and electrodesiccation   Outcome: patient tolerated procedure well   Post-procedure details: wound care instructions given    Specimen 2 - Surgical pathology Differential Diagnosis: R/O BCC vs SCC  Check Margins: Yes    I, Hunt Zajicek, PA-C, have reviewed all documentation's for this visit.  The documentation on 10/25/20 for the exam, diagnosis, procedures and orders are all accurate and complete.

## 2020-11-01 ENCOUNTER — Telehealth: Payer: Self-pay | Admitting: Physician Assistant

## 2020-11-01 NOTE — Telephone Encounter (Signed)
Results, KRS 

## 2020-11-01 NOTE — Telephone Encounter (Signed)
Pathology to patient- 3 month recheck scheduled.

## 2020-11-08 ENCOUNTER — Telehealth: Payer: Self-pay

## 2020-11-08 ENCOUNTER — Ambulatory Visit: Payer: Medicare Other | Admitting: Neurology

## 2020-11-08 NOTE — Telephone Encounter (Signed)
Pt called to cancel Capsule Endoscopy: Pt stated that you would call and reschedule later. Order canceled in [facility]

## 2020-11-29 ENCOUNTER — Ambulatory Visit (INDEPENDENT_AMBULATORY_CARE_PROVIDER_SITE_OTHER): Payer: Medicare Other | Admitting: Allergy and Immunology

## 2020-11-29 ENCOUNTER — Other Ambulatory Visit: Payer: Self-pay

## 2020-11-29 ENCOUNTER — Encounter: Payer: Self-pay | Admitting: Allergy and Immunology

## 2020-11-29 VITALS — BP 110/68 | HR 79 | Temp 97.3°F | Ht 66.5 in | Wt 143.2 lb

## 2020-11-29 DIAGNOSIS — I201 Angina pectoris with documented spasm: Secondary | ICD-10-CM | POA: Diagnosis not present

## 2020-11-29 DIAGNOSIS — B360 Pityriasis versicolor: Secondary | ICD-10-CM

## 2020-11-29 NOTE — Progress Notes (Signed)
[place]   Follow-up Note  Referring Provider: [name], MD Primary Provider: [name], MD Date of Office Visit: 11/29/2020  Subjective:   Angela Hart (DOB: Feb 10, 1951) is a 70 y.o. female who returns to the [facility] [place] on 11/29/2020 in re-evaluation of the following:  HPI: Angela Hart returns to this clinic in evaluation of tinea versicolor.  I last saw her in this clinic on 16 August 2020.   During her last visit she appeared to have a skin condition very consistent with tenia versicolor and we gave her Diflucan to use on a consistent basis from June through August 2022  Her skin has improved significantly and she never developed any of her "outbreaks" and she never had to use any topical 5-FU.  As well, she was having a problem with recurrent vaginal candidiasis since she has been using doxycycline for her rosacea and that completely cleared up with Diflucan administration.  And, she was having a problem with pretty resistant seborrheic dermatitis that completely cleared up with Diflucan administration.  She is interested in staying on Diflucan to treat and prevent her tinea versicolor, vaginal candidiasis, and her seborrheic dermatitis.  Allergies as of 11/29/2020       Reactions   Atorvastatin    Other reaction(s): myalgias   Other    Other reaction(s): rash   Pitavastatin    Other reaction(s): myalgias   Simvastatin    Other reaction(s): myalgias        Medication List    ASTAXANTHIN PO Take 6 mg by mouth daily.   augmented betamethasone dipropionate 0.05 % cream Commonly known as: DIPROLENE-AF Apply topically daily.   buPROPion 300 MG 24 hr tablet Commonly known as: WELLBUTRIN XL Take 300 mg by mouth daily before breakfast.   Calcium Carb-Cholecalciferol 500-100 MG-UNIT Chew Chew by mouth daily.   calcium carbonate 1250 (500 Ca) MG chewable tablet Commonly known as: OS-CAL Chew 1  tablet by mouth daily as needed.   cetirizine 10 MG tablet Commonly known as: ZYRTEC Take 1 tablet (10 mg total) by mouth 2 (two) times daily.   chlorhexidine 0.12 % solution Commonly known as: PERIDEX SMARTSIG:By Mouth   Co Q 10 100 MG Caps Take 100 mg by mouth daily.   dexamethasone 4 MG tablet Commonly known as: DECADRON Take by mouth.   doxycycline 100 MG tablet Commonly known as: VIBRA-TABS Take 100 mg by mouth daily.   DULoxetine 60 MG capsule Commonly known as: CYMBALTA Take 1 capsule by mouth every morning.   fluconazole 150 MG tablet Commonly known as: DIFLUCAN Take one tablet once a week in the months on June, July, and August   fluocinonide 0.05 % external solution Commonly known as: LIDEX Apply topically.   fluticasone 50 MCG/ACT nasal spray Commonly known as: FLONASE Place 1 spray into both nostrils daily.   GLUCOSAMINE CHONDR 1500 COMPLX PO Take 1 capsule by mouth 2 (two) times daily.   HYDROcodone-acetaminophen 5-325 MG tablet Commonly known as: NORCO/VICODIN Take by mouth.   ibuprofen 200 MG tablet Commonly known as: ADVIL Take 200 mg by mouth every 6 (six) hours as needed for moderate pain.   ibuprofen 400 MG tablet Commonly known as: ADVIL Take by mouth.   ketoconazole 2 % shampoo Commonly known as: NIZORAL Apply topically.   levocetirizine 5 MG tablet Commonly known as: XYZAL Take 1 tablet (5 mg total) by mouth every evening.   levothyroxine 100 MCG  tablet Commonly known as: SYNTHROID Take 100 mcg by mouth every other day. With 88 mcg on opposing days   levothyroxine 88 MCG tablet Commonly known as: SYNTHROID Take 88 mcg by mouth every morning.   Magnesium 200 MG Tabs Take 400 mg by mouth daily.   metoCLOPramide 10 MG tablet Commonly known as: Reglan Take 1 tablet (10 mg total) by mouth as directed.   modafinil 100 MG tablet Commonly known as: PROVIGIL Take 100 mg by mouth in the morning and at bedtime. 2 tablet (200 mg)  in the morning and 1 tablet 100 mg at afternoon   multivitamin with minerals Tabs tablet Take 1 tablet by mouth daily.   Polyethyl Glycol-Propyl Glycol 0.4-0.3 % Soln Apply 1 drop to eye 3 (three) times daily as needed (dry eyes).   Prolensa 0.07 % Soln Generic drug: Bromfenac Sodium PLACE ONE DROP INTO OPERATIVE EYE ONCE DAILY, BEGINNING ONE DAY PRIOR TO SURGERY   RESTASIS OP Apply to eye. Put one drop in both eyes twice a day   tacrolimus 0.1 % ointment Commonly known as: PROTOPIC Apply 1 application topically at bedtime. Applies for dermatitis.   Visbiome High Potency Caps Take 1 tablet by mouth daily.   Vitamin B-12 2500 MCG Subl Place 2,500 mcg under the tongue daily.   vitamin C 1000 MG tablet Take 1,000 mg by mouth 2 (two) times daily.   Vitamin D 50 MCG (2000 UT) tablet Take 2,000 Units by mouth 2 (two) times a day.     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   Major depression    clinical- sees leslie brown   MVP (mitral valve prolapse)    mild-seen on echo 2008   Neuromuscular disorder ([place])    raynaud's disease hands/feet   Neuropathy    Obstructive sleep apnea    very mild   Osteoarthritis    Osteoporosis    Pelvic fracture ([facility])    while riding a horse in 2008   Peripheral neuropathy    mild- due to chemotherapy   Prinzmetal angina ([facility])    Raynaud's syndrome    hands/feet   Serrated adenoma of colon    Squamous cell carcinoma of skin  10/27/2014   [facility]- right inner shin,ant  (txpbx), in situ- left top shin (txpbx)   SVD (spontaneous vaginal delivery)    x 1   Thyroid disease     Past Surgical History:  Procedure Laterality Date   ABDOMINO-VAGINAL VESICAL NECK SUSPENSION  10/2018   BUNIONECTOMY     Bil/ hammer toe on right foot   COLONOSCOPY     COLONOSCOPY WITH PROPOFOL N/A 06/10/2012   Procedure: COLONOSCOPY WITH PROPOFOL;  Surgeon: Garlan Fair, MD;  Location: [facility];  Service: Endoscopy;  Laterality: N/A;   COLONOSCOPY WITH PROPOFOL N/A  09/22/2013   Procedure: COLONOSCOPY WITH PROPOFOL;  Surgeon: Garlan Fair, MD;  Location: [facility];  Service: Endoscopy;  Laterality: N/A;   COLONOSCOPY WITH PROPOFOL N/A 08/02/2015   Procedure: COLONOSCOPY WITH PROPOFOL;  Surgeon: Garlan Fair, MD;  Location: [facility];  Service: Endoscopy;  Laterality: N/A;   ESOPHAGOGASTRODUODENOSCOPY (EGD) WITH PROPOFOL N/A 06/10/2012   Procedure: ESOPHAGOGASTRODUODENOSCOPY (EGD) WITH PROPOFOL;  Surgeon: Garlan Fair, MD;  Location: [facility];  Service: Endoscopy;  Laterality: N/A;   ESOPHAGOGASTRODUODENOSCOPY (EGD) WITH PROPOFOL N/A 09/22/2013   Procedure: ESOPHAGOGASTRODUODENOSCOPY (EGD) WITH PROPOFOL;  Surgeon: Garlan Fair, MD;  Location: [facility];  Service: Endoscopy;  Laterality: N/A;   FOOT NEUROMA SURGERY Right    interstem  12/2018   MENISCUS REPAIR Right    for meniscus tear   NASAL SINUS SURGERY  1993   OVARIAN CYST REMOVAL     Laparotomy   PARTIAL COLECTOMY  04/12/2010   POLYPECTOMY     port a cath insertion  2012   for chemo use   PORT-A-CATH REMOVAL  05/03/2011   RETINAL TEAR REPAIR CRYOTHERAPY Right    TOOTH EXTRACTION     molars   TOTAL ABDOMINAL HYSTERECTOMY W/ BILATERAL SALPINGOOPHORECTOMY  2013   UPPER GASTROINTESTINAL ENDOSCOPY     VARICOSE VEIN SURGERY Bilateral     Review of systems negative except as noted in HPI / PMHx or noted below:  Review of Systems  Constitutional:  Negative.   HENT: Negative.    Eyes: Negative.   Respiratory: Negative.    Cardiovascular: Negative.   Gastrointestinal: Negative.   Genitourinary: Negative.   Musculoskeletal: Negative.   Skin: Negative.   Neurological: Negative.   Endo/Heme/Allergies: Negative.   Psychiatric/Behavioral: Negative.      Objective:   Vitals:   11/29/20 1131  BP: 110/68  Pulse: 79  Temp: (!) 97.3 F (36.3 C)  SpO2: 96%   Height: 5' 6.5" (168.9 cm)  Weight: 143 lb 3.2 oz (65 kg)   Physical Exam Skin:    Findings: No rash.    Diagnostics: none  Assessment and Plan:   1. Tinea versicolor    1.  Fluconazole 150 - 1 tablet 1 time a month  2.  Return to clinic in 12 months or earlier if problem  3.  Obtain fall flu vaccine  Angela Hart has had an excellent response to the use of fluconazole regarding her tinea versicolor and she did not have her "big skin outbreak" that usually develops every summer.  As well, her vaginal candidiasis and her seborrheic keratosis completely resolved while utilizing this agent.  She is interested in continuing on an antifungal agents.  We will try to have her use fluconazole 1 time per month and see what happens over the course of the next year with this approach.  Allena Katz, MD Allergy / Immunology [place]

## 2020-11-29 NOTE — Patient Instructions (Addendum)
  1.  Fluconazole 150 - 1 tablet 1 time a month  2.  Return to clinic in 12 months or earlier if problem  3. Obtain fall flu vaccine

## 2020-11-30 ENCOUNTER — Encounter: Payer: Self-pay | Admitting: Allergy and Immunology

## 2020-12-13 ENCOUNTER — Ambulatory Visit: Payer: Medicare Other | Admitting: Physician Assistant

## 2020-12-21 ENCOUNTER — Ambulatory Visit (AMBULATORY_SURGERY_CENTER): Payer: Medicare Other

## 2020-12-21 ENCOUNTER — Other Ambulatory Visit: Payer: Self-pay

## 2020-12-21 VITALS — Ht 66.5 in | Wt 142.0 lb

## 2020-12-21 DIAGNOSIS — Z86010 Personal history of colonic polyps: Secondary | ICD-10-CM

## 2020-12-21 DIAGNOSIS — Z1509 Genetic susceptibility to other malignant neoplasm: Secondary | ICD-10-CM

## 2020-12-21 MED ORDER — PLENVU 140 G PO SOLR: 1.0000 | ORAL | 0 refills | Status: DC

## 2020-12-21 MED ORDER — ONDANSETRON HCL 4 MG PO TABS: 4.0000 mg | ORAL_TABLET | ORAL | 0 refills | Status: DC

## 2020-12-21 NOTE — Progress Notes (Signed)
   Patient's pre-visit was done today over the phone with the patient   Name,DOB and address verified.   Patient denies any allergies to Eggs and Soy.  Patient denies any problems with anesthesia/sedation. Patient denies taking diet pills or blood thinners.  Denies atrial flutter or atrial fib Denies chronic constipation No home Oxygen.   Packet of Prep instructions mailed to patient including a copy of a consent form-pt is aware.  Patient understands to call us back with any questions or concerns.  Patient is aware of our care-partner policy and [id number] safety protocol.  Pt refused all preps except Plenvu.  Coupon given, but patient understands it may be an large OOP expense if coupon not valid.  Also requests Zofran for nausea.  She notes that she had a very hard time with preps but this works the best.

## 2020-12-22 ENCOUNTER — Ambulatory Visit (INDEPENDENT_AMBULATORY_CARE_PROVIDER_SITE_OTHER): Payer: Medicare Other | Admitting: Physician Assistant

## 2020-12-22 ENCOUNTER — Other Ambulatory Visit: Payer: Self-pay

## 2020-12-22 ENCOUNTER — Encounter: Payer: Self-pay | Admitting: Physician Assistant

## 2020-12-22 DIAGNOSIS — L219 Seborrheic dermatitis, unspecified: Secondary | ICD-10-CM | POA: Diagnosis not present

## 2020-12-22 DIAGNOSIS — S0100XA Unspecified open wound of scalp, initial encounter: Secondary | ICD-10-CM

## 2020-12-22 DIAGNOSIS — S0120XA Unspecified open wound of nose, initial encounter: Secondary | ICD-10-CM | POA: Diagnosis not present

## 2020-12-22 DIAGNOSIS — L089 Local infection of the skin and subcutaneous tissue, unspecified: Secondary | ICD-10-CM | POA: Diagnosis not present

## 2020-12-22 DIAGNOSIS — I201 Angina pectoris with documented spasm: Secondary | ICD-10-CM | POA: Diagnosis not present

## 2020-12-22 MED ORDER — KETOCONAZOLE 2 % EX SHAM: MEDICATED_SHAMPOO | CUTANEOUS | 2 refills | Status: DC

## 2020-12-22 MED ORDER — KETOCONAZOLE 2 % EX CREA: 1.0000 "application " | TOPICAL_CREAM | Freq: Two times a day (BID) | CUTANEOUS | 1 refills | Status: DC

## 2020-12-22 MED ORDER — ALCLOMETASONE DIPROPIONATE 0.05 % EX CREA: TOPICAL_CREAM | CUTANEOUS | 1 refills | Status: DC

## 2020-12-22 NOTE — Patient Instructions (Signed)
Mix the creams equal parts and apply to face Shampoo apply let it set 3-5 minutes and rinse

## 2020-12-23 ENCOUNTER — Encounter: Payer: Self-pay | Admitting: Physician Assistant

## 2020-12-23 NOTE — Progress Notes (Signed)
   Follow-Up Visit   Subjective  Angela Hart is a 70 y.o. female who presents for the following: Skin Problem (Patient here today with a list of skin issues that she would like addressed. ). Mostly her face is getting red and scaly. She takes low dose doxycycline everyday. Place on her scalp where we performed a biopsy is getting crusty and she is getting bald spots.   The following portions of the chart were reviewed this encounter and updated as appropriate:  Tobacco  Allergies  Meds  Problems  Med Hx  Surg Hx  Fam Hx      Objective  Well appearing patient in no apparent distress; mood and affect are within normal limits.  All skin waist up examined.  central facial Patient is currently on 100 mg doxycycline for inflammatory rosacea.  Left Alar Crease, Scalp Scalp- yellow crusted excoriations on an erythematous base.  There is regrowth of hair with white discoloration from the biopsy. That site is clear of scale. Significant scale on central face.   Assessment & Plan  Seborrheic dermatitis central facial  ketoconazole (NIZORAL) 2 % cream - central facial Apply 1 application topically 2 (two) times daily. Apply once to twice a day  alclomethasone (ACLOVATE) 0.05 % cream - central facial Apply once to twice a day  ketoconazole (NIZORAL) 2 % shampoo - central facial Apply to scalp let it set 3-5 min and rinse out  Wound infection (2) Left Alar Crease; Scalp  Anaerobic and Aerobic Culture - Left Alar Crease, Scalp  Anaerobic and Aerobic Culture - Left Alar Crease, Scalp  PDT was set up for diffuse actinic keratoses on her face.   I, Konnie Noffsinger, PA-C, have reviewed all documentation's for this visit.  The documentation on 12/23/20 for the exam, diagnosis, procedures and orders are all accurate and complete.

## 2020-12-28 LAB — ANAEROBIC AND AEROBIC CULTURE
MICRO NUMBER:: 12498992
MICRO NUMBER:: 12498993
SPECIMEN QUALITY:: ADEQUATE
SPECIMEN QUALITY:: ADEQUATE

## 2020-12-29 LAB — ANAEROBIC AND AEROBIC CULTURE
MICRO NUMBER:: 12498990
MICRO NUMBER:: 12498991
SPECIMEN QUALITY:: ADEQUATE
SPECIMEN QUALITY:: ADEQUATE

## 2021-01-02 ENCOUNTER — Emergency Department (HOSPITAL_BASED_OUTPATIENT_CLINIC_OR_DEPARTMENT_OTHER): Payer: Medicare Other

## 2021-01-02 ENCOUNTER — Other Ambulatory Visit: Payer: Self-pay

## 2021-01-02 ENCOUNTER — Emergency Department (HOSPITAL_BASED_OUTPATIENT_CLINIC_OR_DEPARTMENT_OTHER)
Admission: EM | Admit: 2021-01-02 | Discharge: 2021-01-02 | Disposition: A | Discharge status: Home / Self Care | Payer: Medicare Other | Attending: Emergency Medicine | Admitting: Emergency Medicine

## 2021-01-02 ENCOUNTER — Encounter (HOSPITAL_BASED_OUTPATIENT_CLINIC_OR_DEPARTMENT_OTHER): Payer: Self-pay

## 2021-01-02 DIAGNOSIS — S0003XA Contusion of scalp, initial encounter: Secondary | ICD-10-CM

## 2021-01-02 DIAGNOSIS — W01198A Fall on same level from slipping, tripping and stumbling with subsequent striking against other object, initial encounter: Secondary | ICD-10-CM | POA: Diagnosis not present

## 2021-01-02 DIAGNOSIS — S0990XA Unspecified injury of head, initial encounter: Secondary | ICD-10-CM | POA: Diagnosis present

## 2021-01-02 DIAGNOSIS — W19XXXA Unspecified fall, initial encounter: Secondary | ICD-10-CM

## 2021-01-02 NOTE — ED Triage Notes (Signed)
Fell and hit head on cement, denies LOC. A/o x4. C/o head pain 8/10, starts as swelling to posterior head, pain continues to neck.

## 2021-01-02 NOTE — ED Provider Notes (Signed)
[facility] DEPT Provider Note   CSN: [id number] Arrival date & time: 01/02/21  1252     History Chief Complaint  Patient presents with   Angela Hart is a 70 y.o. female presenting to Ed with fall and head injury.  Reports she tripped running backwards (away from a snake) today and landed striking her back of head on the concrete.  No LOC.  This occurred prior to arrival in Ed.  Not on A/C.  She reports hx of "head trauma" and concussions from horse injuries in the past, and follows with a specialist for this.  Reporting mild-moderate throbbing headache.  Took ibuprofen 400 mg prior to arrival.  Some neck soreness.  No other pain in extremities.  Ambulating into ED.  Here with husband.  HPI     Past Medical History:  Diagnosis Date   Abdominal pain    Allergy    Anemia    Anxiety    Arthritis    hands/hip, gout left ankle   Atypical nevus 10/04/2003   slight-mod-left post axilla   Blood transfusion 03/2010   at [facility]   Cataract    Colon cancer ([place]) 03/2010   Concussion    per pt, has had 2-3 concussions due to horse injuries   Depression    Fatigue    GERD (gastroesophageal reflux disease)    occasional   Heart murmur    hx -no problems   Hyperlipidemia    Interstitial cystitis    Lactose intolerance    Lynch syndrome 12/22/2011   Major depression    clinical- sees leslie brown   MVP (mitral valve prolapse)    mild-seen on echo 2008   Neuromuscular disorder ([place])    raynaud's disease hands/feet   Neuropathy    Obstructive sleep apnea    very mild   Osteoarthritis    Osteoporosis    Pelvic fracture (HCC)    while riding a horse in 2008   Peripheral neuropathy    mild- due to chemotherapy   Prinzmetal angina (HCC)    Raynaud's syndrome    hands/feet   Serrated adenoma of colon    Squamous cell carcinoma of skin 10/27/2014   KA- right inner shin,ant  (txpbx), in situ- left top shin (txpbx)   SVD (spontaneous vaginal  delivery)    x 1   Thyroid disease     Patient Active Problem List   Diagnosis Date Noted   Prinzmetal angina ([place]) 07/14/2018   Raynaud's syndrome 07/14/2018   Mixed hyperlipidemia 07/14/2018   Lynch syndrome 12/22/2011   S/P total hysterectomy and bilateral salpingo-oophorectomy (10/11) 12/22/2011   Abnormal CT scan, chest 01/18/2011   Cough 01/01/2011   Malignant neoplasm of transverse colon ([place]) 11/29/2010    Past Surgical History:  Procedure Laterality Date   ABDOMINO-VAGINAL VESICAL NECK SUSPENSION  10/2018   BUNIONECTOMY     Bil/ hammer toe on right foot   COLONOSCOPY     COLONOSCOPY WITH PROPOFOL N/A 06/10/2012   Procedure: COLONOSCOPY WITH PROPOFOL;  Surgeon: Garlan Fair, MD;  Location: [facility];  Service: Endoscopy;  Laterality: N/A;   COLONOSCOPY WITH PROPOFOL N/A 09/22/2013   Procedure: COLONOSCOPY WITH PROPOFOL;  Surgeon: Garlan Fair, MD;  Location: [facility];  Service: Endoscopy;  Laterality: N/A;   COLONOSCOPY WITH PROPOFOL N/A 08/02/2015   Procedure: COLONOSCOPY WITH PROPOFOL;  Surgeon: Garlan Fair, MD;  Location: [facility];  Service: Endoscopy;  Laterality: N/A;  ESOPHAGOGASTRODUODENOSCOPY (EGD) WITH PROPOFOL N/A 06/10/2012   Procedure: ESOPHAGOGASTRODUODENOSCOPY (EGD) WITH PROPOFOL;  Surgeon: Garlan Fair, MD;  Location: [facility];  Service: Endoscopy;  Laterality: N/A;   ESOPHAGOGASTRODUODENOSCOPY (EGD) WITH PROPOFOL N/A 09/22/2013   Procedure: ESOPHAGOGASTRODUODENOSCOPY (EGD) WITH PROPOFOL;  Surgeon: Garlan Fair, MD;  Location: [facility];  Service: Endoscopy;  Laterality: N/A;   FOOT NEUROMA SURGERY Right    interstem  12/2018   MENISCUS REPAIR Right    for meniscus tear   NASAL SINUS SURGERY  1993   OVARIAN CYST REMOVAL     Laparotomy   PARTIAL COLECTOMY  04/12/2010   POLYPECTOMY     port a cath insertion  2012   for chemo use   PORT-A-CATH REMOVAL  05/03/2011   RETINAL TEAR REPAIR CRYOTHERAPY Right    TOOTH EXTRACTION      molars   TOTAL ABDOMINAL HYSTERECTOMY W/ BILATERAL SALPINGOOPHORECTOMY  2013   UPPER GASTROINTESTINAL ENDOSCOPY     VARICOSE VEIN SURGERY Bilateral      OB History   No obstetric history on file.     Family History  Problem Relation Age of Onset   Heart disease Mother    Heart attack Mother    Heart disease Father    Heart attack Father    Heart disease Brother    Heart attack Brother    Other Brother        Field seismologist syndrome-PMS2   Cancer - Other Brother    Colon polyps Brother    Other Brother        Lynch syndrome-PMS2   Sleep apnea Brother    Heart disease Maternal Grandmother    Heart attack Maternal Grandmother    Heart disease Maternal Grandfather    Heart attack Maternal Grandfather    Alzheimer's disease Paternal Grandmother    Emphysema Paternal Grandfather    Other Son        lynch syndrome-PMS2   Colon polyps Son    Colon cancer Other    Esophageal cancer Neg Hx    Stomach cancer Neg Hx    Rectal cancer Neg Hx     Social History   Tobacco Use   Smoking status: Never   Smokeless tobacco: Never   Tobacco comments:    high school years 1973 - tried  to smoke and did not take or like it   Vaping Use   Vaping Use: Never used  Substance Use Topics   Alcohol use: No   Drug use: No    Home Medications Prior to Admission medications   Medication Sig Start Date End Date Taking? Authorizing Provider  alclomethasone (ACLOVATE) 0.05 % cream Apply once to twice a day 12/22/20   [name] R, PA-C  Ascorbic Acid (VITAMIN C) 1000 MG tablet Take 1,000 mg by mouth 2 (two) times daily.    [provider]  ASTAXANTHIN PO Take 6 mg by mouth daily.    [provider]  augmented betamethasone dipropionate (DIPROLENE-AF) 0.05 % cream Apply topically daily. Patient not taking: Reported on 12/21/2020 12/29/19   Lavonna Monarch, MD  azithromycin (ZITHROMAX) 250 MG tablet Take 250 mg by mouth as directed. 11/29/20   [provider]   Bromfenac Sodium (PROLENSA) 0.07 % SOLN PLACE ONE DROP INTO OPERATIVE EYE ONCE DAILY, BEGINNING ONE DAY PRIOR TO SURGERY 02/17/20   [provider]  buPROPion (WELLBUTRIN XL) 300 MG 24 hr tablet Take 300 mg by mouth daily before breakfast.    [provider]  Calcium Carb-Cholecalciferol 500-100 MG-UNIT CHEW Chew by mouth daily.    [provider]  calcium carbonate (OS-CAL) 1250 (500 Ca) MG chewable tablet Chew 1 tablet by mouth daily as needed.     [provider]  cetirizine (ZYRTEC) 10 MG tablet Take 1 tablet (10 mg total) by mouth 2 (two) times daily. Patient not taking: Reported on 12/21/2020 08/16/20   Jiles Prows, MD  chlorhexidine (PERIDEX) 0.12 % solution SMARTSIG:By Mouth Patient not taking: Reported on 12/21/2020 08/23/20   [provider]  Cholecalciferol (VITAMIN D) 2000 UNITS tablet Take 2,000 Units by mouth 2 (two) times a day.     [provider]  Coenzyme Q10 (CO Q 10) 100 MG CAPS Take 100 mg by mouth daily.    [provider]  Cyanocobalamin (VITAMIN B-12) 2500 MCG SUBL Place 2,500 mcg under the tongue daily.     [provider]  cycloSPORINE (RESTASIS OP) Apply to eye. Put one drop in both eyes twice a day Patient not taking: Reported on 12/21/2020    [provider]  dexamethasone (DECADRON) 4 MG tablet Take by mouth. Patient not taking: Reported on 12/21/2020 08/23/20   [provider]  doxycycline (VIBRA-TABS) 100 MG tablet Take 100 mg by mouth daily. 08/25/20   [provider]  DULoxetine (CYMBALTA) 60 MG capsule Take 1 capsule by mouth every morning. 07/12/14   [provider]  fluconazole (DIFLUCAN) 150 MG tablet Take one tablet once a week in the months on June, July, and August 08/16/20   Kozlow, Donnamarie Poag, MD  fluocinonide (LIDEX) 0.05 % external solution Apply topically. Patient not taking: Reported on 12/21/2020 08/15/20   [provider]  fluticasone (FLONASE)  50 MCG/ACT nasal spray Place 1 spray into both nostrils daily.    [provider]  Glucosamine-Chondroit-Vit C-Mn (GLUCOSAMINE CHONDR 1500 COMPLX PO) Take 1 capsule by mouth 2 (two) times daily.     [provider]  HYDROcodone-acetaminophen (NORCO/VICODIN) 5-325 MG tablet Take by mouth. Patient not taking: Reported on 12/21/2020 08/23/20   [provider]  ibuprofen (ADVIL) 400 MG tablet Take by mouth. 08/23/20   [provider]  ibuprofen (ADVIL,MOTRIN) 200 MG tablet Take 200 mg by mouth every 6 (six) hours as needed for moderate pain.    [provider]  ketoconazole (NIZORAL) 2 % cream Apply 1 application topically 2 (two) times daily. Apply once to twice a day 12/22/20 02/02/21  [name] R, PA-C  ketoconazole (NIZORAL) 2 % shampoo Apply to scalp let it set 3-5 min and rinse out 12/22/20   [name] R, PA-C  levocetirizine (XYZAL) 5 MG tablet Take 1 tablet (5 mg total) by mouth every evening. Patient not taking: Reported on 12/21/2020 09/21/20   Warren Danes, PA-C  levothyroxine (SYNTHROID) 100 MCG tablet Take 100 mcg by mouth every other day. With 88 mcg on opposing days 06/18/15   [provider]  levothyroxine (SYNTHROID) 88 MCG tablet Take 88 mcg by mouth every morning. 09/07/19   [provider]  Magnesium 200 MG TABS Take 400 mg by mouth daily.     [provider]  metoCLOPramide (REGLAN) 10 MG tablet Take 1 tablet (10 mg total) by mouth as directed. Patient not taking: Reported on 12/21/2020 09/16/19   Jerene Bears, MD  modafinil (PROVIGIL) 100 MG tablet Take 100 mg by mouth in the morning and at bedtime. 2 tablet (200 mg) in the morning and 1 tablet 100  mg at afternoon    [provider]  Multiple Vitamin (MULTIVITAMIN WITH MINERALS) TABS Take 1 tablet by mouth daily.    [provider]  ondansetron (ZOFRAN) 4 MG tablet Take 1 tablet (4 mg total) by mouth as directed. 1 table 30-60  minutes prior to each prep dose. 12/21/20   Pyrtle, Lajuan Lines, MD  PEG-KCl-NaCl-NaSulf-Na Asc-C (PLENVU) 140 g SOLR Take 1 kit by mouth as directed. 12/21/20   Pyrtle, Lajuan Lines, MD  Polyethyl Glycol-Propyl Glycol 0.4-0.3 % SOLN Apply 1 drop to eye 3 (three) times daily as needed (dry eyes).    [provider]  Probiotic Product (VISBIOME HIGH POTENCY) CAPS Take 1 tablet by mouth daily. 12/03/17   [provider]  tacrolimus (PROTOPIC) 0.1 % ointment Apply 1 application topically at bedtime. Applies for dermatitis.    [provider]    Allergies    Atorvastatin, Other, Pitavastatin, and Simvastatin  Review of Systems   Review of Systems  Constitutional:  Negative for chills and fever.  Respiratory:  Negative for cough and shortness of breath.   Cardiovascular:  Negative for chest pain and palpitations.  Gastrointestinal:  Negative for abdominal pain and vomiting.  Musculoskeletal:  Positive for neck pain. Negative for arthralgias and back pain.  Skin:  Negative for color change and rash.  Neurological:  Positive for headaches. Negative for syncope, weakness and numbness.  All other systems reviewed and are negative.  Physical Exam Updated Vital Signs BP 126/71 (BP Location: Right Arm)   Pulse 78   Temp 98 F (36.7 C) (Oral)   Resp 18   Ht 5' 6.5" (1.689 m)   Wt 64.4 kg   SpO2 99%   BMI 22.58 kg/m   Physical Exam Constitutional:      General: She is not in acute distress. HENT:     Head: Normocephalic.     Comments: Moderate occipital hematoma, no open or active bleeding Eyes:     Conjunctiva/sclera: Conjunctivae normal.     Pupils: Pupils are equal, round, and reactive to light.  Cardiovascular:     Rate and Rhythm: Normal rate and regular rhythm.  Pulmonary:     Effort: Pulmonary effort is normal. No respiratory distress.  Skin:    General: Skin is warm and dry.  Neurological:     General: No focal deficit present.     Mental Status: She is alert  and oriented to person, place, and time. Mental status is at baseline.  Psychiatric:        Mood and Affect: Mood normal.        Behavior: Behavior normal.    ED Results / Procedures / Treatments   Labs (all labs ordered are listed, but only abnormal results are displayed) Labs Reviewed - No data to display  EKG None  Radiology CT Head Wo Contrast  Result Date: 01/02/2021 CLINICAL DATA:  Fall. EXAM: CT HEAD WITHOUT CONTRAST CT CERVICAL SPINE WITHOUT CONTRAST TECHNIQUE: Multidetector CT imaging of the head and cervical spine was performed following the standard protocol without intravenous contrast. Multiplanar CT image reconstructions of the cervical spine were also generated. COMPARISON:  CT head dated Aug 03, 2006. FINDINGS: CT HEAD FINDINGS Brain: No evidence of acute infarction, hemorrhage, hydrocephalus, extra-axial collection or mass lesion/mass effect. Vascular: No hyperdense vessel or unexpected calcification. Skull: Normal. Negative for fracture or focal lesion. Sinuses/Orbits: No acute finding. Other: Small posterior parietal scalp hematoma. CT CERVICAL SPINE FINDINGS Alignment: No traumatic malalignment. Slight reversal of  the normal cervical lordosis with trace retrolisthesis at C5-C6 and trace anterolisthesis at C7-T1. Skull base and vertebrae: No acute fracture. No primary bone lesion or focal pathologic process. Soft tissues and spinal canal: No prevertebral fluid or swelling. No visible canal hematoma. Disc levels: Severe disc height loss and uncovertebral hypertrophy at C5-C6. Moderate disc height loss and uncovertebral hypertrophy at C4-C5 and C6-C7. Diffuse moderate facet arthropathy throughout the cervical spine. Upper chest: Negative. Other: Postsurgical changes of the left maxillary sinus with sequelae of chronic sinusitis. IMPRESSION: 1. No acute intracranial abnormality. Small posterior parietal scalp hematoma. 2. No acute cervical spine fracture or traumatic listhesis. 3.  Moderate to severe cervical spondylosis. Electronically Signed   By: Titus Dubin M.D.   On: 01/02/2021 14:13   CT Cervical Spine Wo Contrast  Result Date: 01/02/2021 CLINICAL DATA:  Fall. EXAM: CT HEAD WITHOUT CONTRAST CT CERVICAL SPINE WITHOUT CONTRAST TECHNIQUE: Multidetector CT imaging of the head and cervical spine was performed following the standard protocol without intravenous contrast. Multiplanar CT image reconstructions of the cervical spine were also generated. COMPARISON:  CT head dated Aug 03, 2006. FINDINGS: CT HEAD FINDINGS Brain: No evidence of acute infarction, hemorrhage, hydrocephalus, extra-axial collection or mass lesion/mass effect. Vascular: No hyperdense vessel or unexpected calcification. Skull: Normal. Negative for fracture or focal lesion. Sinuses/Orbits: No acute finding. Other: Small posterior parietal scalp hematoma. CT CERVICAL SPINE FINDINGS Alignment: No traumatic malalignment. Slight reversal of the normal cervical lordosis with trace retrolisthesis at C5-C6 and trace anterolisthesis at C7-T1. Skull base and vertebrae: No acute fracture. No primary bone lesion or focal pathologic process. Soft tissues and spinal canal: No prevertebral fluid or swelling. No visible canal hematoma. Disc levels: Severe disc height loss and uncovertebral hypertrophy at C5-C6. Moderate disc height loss and uncovertebral hypertrophy at C4-C5 and C6-C7. Diffuse moderate facet arthropathy throughout the cervical spine. Upper chest: Negative. Other: Postsurgical changes of the left maxillary sinus with sequelae of chronic sinusitis. IMPRESSION: 1. No acute intracranial abnormality. Small posterior parietal scalp hematoma. 2. No acute cervical spine fracture or traumatic listhesis. 3. Moderate to severe cervical spondylosis. Electronically Signed   By: Titus Dubin M.D.   On: 01/02/2021 14:13    Procedures Procedures   Medications Ordered in ED Medications - No data to display  ED Course   I have reviewed the triage vital signs and the nursing notes.  Pertinent labs & imaging results that were available during my care of the patient were reviewed by me and considered in my medical decision making (see chart for details).  Mechanical fall today with isolated head injury on pavement CTH and C-spine reviewed -  no acute traumatic injuries, fractures, or ICH noted. C-spine collar cleared  Possible mild concussion symptoms - discussed f/u with her specialist.  Husband will help her get home.  Otherwise no other traumatic injuries per history or exam  Final Clinical Impression(s) / ED Diagnoses Final diagnoses:  Fall, initial encounter  Hematoma of scalp, initial encounter  Injury of head, initial encounter    Rx / DC Orders ED Discharge Orders     None        Zyriah Mask, Carola Rhine, MD 01/02/21 2340

## 2021-01-02 NOTE — Discharge Instructions (Signed)
Follow up with your head injury specialist for your injury today.  For pain at home you can take ibuprofen 600 mg every 6 hours as needed for the next 5-7 days.  You can also add 650 mg tylenol every 6 hours as needed for pain.  Use ice and steady pressure on your hematoma (goose egg) on the back of your head - 10 minutes of cold packs at a time, as tolerated, with breaks in-between.

## 2021-01-04 ENCOUNTER — Ambulatory Visit (AMBULATORY_SURGERY_CENTER): Payer: Medicare Other | Admitting: Internal Medicine

## 2021-01-04 ENCOUNTER — Encounter: Payer: Self-pay | Admitting: Internal Medicine

## 2021-01-04 ENCOUNTER — Other Ambulatory Visit: Payer: Self-pay

## 2021-01-04 VITALS — BP 126/60 | HR 67 | Temp 97.3°F | Resp 17 | Ht 66.5 in | Wt 142.0 lb

## 2021-01-04 DIAGNOSIS — D122 Benign neoplasm of ascending colon: Secondary | ICD-10-CM | POA: Diagnosis not present

## 2021-01-04 DIAGNOSIS — Z85038 Personal history of other malignant neoplasm of large intestine: Secondary | ICD-10-CM

## 2021-01-04 DIAGNOSIS — D124 Benign neoplasm of descending colon: Secondary | ICD-10-CM

## 2021-01-04 DIAGNOSIS — D125 Benign neoplasm of sigmoid colon: Secondary | ICD-10-CM

## 2021-01-04 DIAGNOSIS — Z1509 Genetic susceptibility to other malignant neoplasm: Secondary | ICD-10-CM

## 2021-01-04 MED ORDER — SODIUM CHLORIDE 0.9 % IV SOLN: 500.0000 mL | Freq: Once | INTRAVENOUS | Status: DC

## 2021-01-04 NOTE — Op Note (Signed)
[facility] Patient Name: Angela Hart Procedure Date: 01/04/2021 11:01 AM MRN: [id number] Endoscopist: Jerene Bears , MD Age: 70 Referring MD:  Date of Birth: 10-08-1950 Gender: Female Account #: [id number] Procedure:                Colonoscopy Indications:              Last colonoscopy: July 2021, Lynch Syndrome with                            personal history of colon cancer and adenomatous                            and SSPs Medicines:                Monitored Anesthesia Care Procedure:                Pre-Anesthesia Assessment:                           - Prior to the procedure, a History and Physical                            was performed, and patient medications and                            allergies were reviewed. The patient's tolerance of                            previous anesthesia was also reviewed. The risks                            and benefits of the procedure and the sedation                            options and risks were discussed with the patient.                            All questions were answered, and informed consent                            was obtained. Prior Anticoagulants: The patient has                            taken no previous anticoagulant or antiplatelet                            agents. ASA Grade Assessment: II - A patient with                            mild systemic disease. After reviewing the risks                            and benefits, the patient was deemed in  satisfactory condition to undergo the procedure.                           After obtaining informed consent, the colonoscope                            was passed under direct vision. Throughout the                            procedure, the patient's blood pressure, pulse, and                            oxygen saturations were monitored continuously. The                            PCF-HQ190L Colonoscope was introduced through the                             anus and advanced to the cecum, identified by                            appendiceal orifice and ileocecal valve. The                            colonoscopy was performed without difficulty. The                            patient tolerated the procedure well. The quality                            of the bowel preparation was good. The ileocecal                            valve, appendiceal orifice, and rectum were                            photographed. The bowel preparation used was Plenvu                            + metoclopramide via split dose instruction. Scope In: 11:06:50 AM Scope Out: 11:29:45 AM Scope Withdrawal Time: 0 hours 16 minutes 55 seconds  Total Procedure Duration: 0 hours 22 minutes 55 seconds  Findings:                 The digital rectal exam was normal.                           Two sessile polyps were found in the ascending                            colon. The polyps were 3 to 5 mm in size. These                            polyps were removed with a cold snare.  Resection                            and retrieval were complete.                           Two sessile polyps were found in the descending                            colon. The polyps were 3 to 6 mm in size. These                            polyps were removed with a cold snare. Resection                            and retrieval were complete.                           Two sessile polyps were found in the sigmoid colon.                            The polyps were 2 to 3 mm in size. These polyps                            were removed with a cold snare. Resection and                            retrieval were complete.                           There was evidence of a prior end-to-end                            colo-colonic anastomosis in the transverse colon.                            This was patent and was characterized by healthy                            appearing mucosa.                            Multiple small and large-mouthed diverticula were                            found in the sigmoid colon and descending colon.                           Retroflexion in the rectum was not performed. Complications:            No immediate complications. Estimated Blood Loss:     Estimated blood loss was minimal. Impression:               - Two 3 to 5 mm polyps in the ascending colon,  removed with a cold snare. Resected and retrieved.                           - Two 3 to 6 mm polyps in the descending colon,                            removed with a cold snare. Resected and retrieved.                           - Two 2 to 3 mm polyps in the sigmoid colon,                            removed with a cold snare. Resected and retrieved.                           - Patent end-to-end colo-colonic anastomosis,                            characterized by healthy appearing mucosa.                           - Diverticulosis in the sigmoid colon and in the                            descending colon. Recommendation:           - Patient has a contact number available for                            emergencies. The signs and symptoms of potential                            delayed complications were discussed with the                            patient. Return to normal activities tomorrow.                            Written discharge instructions were provided to the                            patient.                           - Resume previous diet.                           - Continue present medications.                           - Await pathology results.                           - Repeat colonoscopy in 1 year for surveillance.                           -  VCE with 1/2 MiraLax prep (24 oz Gatorade + 8                            doses of MiraLax) night before pill camera study.                            Can give metoclopramide 10 mg before prep.                             Indication: Lynch syndrome. Jerene Bears, MD 01/04/2021 11:36:04 AM This report has been signed electronically.

## 2021-01-04 NOTE — Progress Notes (Signed)
Report to PACU, RN, vss, BBS= Clear.  

## 2021-01-04 NOTE — Patient Instructions (Signed)
Information on polyps and diverticulosis given to you today.  Await pathology results.  Resume previous diet and medications.  Repeat colonoscopy in 1 year for surveillance.  You will be called to schedule a Video Capsule Endoscopy.     YOU HAD AN ENDOSCOPIC PROCEDURE TODAY AT [facility]:   Refer to the procedure report that was given to you for any specific questions about what was found during the examination.  If the procedure report does not answer your questions, please call your gastroenterologist to clarify.  If you requested that your care partner not be given the details of your procedure findings, then the procedure report has been included in a sealed envelope for you to review at your convenience later.  YOU SHOULD EXPECT: Some feelings of bloating in the abdomen. Passage of more gas than usual.  Walking can help get rid of the air that was put into your GI tract during the procedure and reduce the bloating. If you had a lower endoscopy (such as a colonoscopy or flexible sigmoidoscopy) you may notice spotting of blood in your stool or on the toilet paper. If you underwent a bowel prep for your procedure, you may not have a normal bowel movement for a few days.  Please Note:  You might notice some irritation and congestion in your nose or some drainage.  This is from the oxygen used during your procedure.  There is no need for concern and it should clear up in a day or so.  SYMPTOMS TO REPORT IMMEDIATELY:  Following lower endoscopy (colonoscopy or flexible sigmoidoscopy):  Excessive amounts of blood in the stool  Significant tenderness or worsening of abdominal pains  Swelling of the abdomen that is new, acute  Fever of 100F or higher  For urgent or emergent issues, a gastroenterologist can be reached at any hour by calling [phone]. Do not use MyChart messaging for urgent concerns.    DIET:  We do recommend a small meal at first, but then you may  proceed to your regular diet.  Drink plenty of fluids but you should avoid alcoholic beverages for 24 hours.  ACTIVITY:  You should plan to take it easy for the rest of today and you should NOT DRIVE or use heavy machinery until tomorrow (because of the sedation medicines used during the test).    FOLLOW UP: Our staff will call the number listed on your records 48-72 hours following your procedure to check on you and address any questions or concerns that you may have regarding the information given to you following your procedure. If we do not reach you, we will leave a message.  We will attempt to reach you two times.  During this call, we will ask if you have developed any symptoms of COVID 19. If you develop any symptoms (ie: fever, flu-like symptoms, shortness of breath, cough etc.) before then, please call [phone].  If you test positive for Covid 19 in the 2 weeks post procedure, please call and report this information to [place].    If any biopsies were taken you will be contacted by phone or by letter within the next 1-3 weeks.  Please call us at [phone] if you have not heard about the biopsies in 3 weeks.    SIGNATURES/CONFIDENTIALITY: You and/or your care partner have signed paperwork which will be entered into your electronic medical record.  These signatures attest to the fact that that the information above on your After Visit Summary  has been reviewed and is understood.  Full responsibility of the confidentiality of this discharge information lies with you and/or your care-partner.  

## 2021-01-04 NOTE — Progress Notes (Signed)
GASTROENTEROLOGY PROCEDURE H&P NOTE   Primary Care Physician: [name], MD    Reason for Procedure:  Lynch syndrome and personal history of colon cancer and colon polyps  Plan:    Surveillance colonoscopy  Patient is appropriate for endoscopic procedure(s) in the ambulatory ([place]) setting.  The nature of the procedure, as well as the risks, benefits, and alternatives were carefully and thoroughly reviewed with the patient. Ample time for discussion and questions allowed. The patient understood, was satisfied, and agreed to proceed.     HPI: Angela Hart is a 70 y.o. female who presents for colonoscopy for surveillance in the setting of Lynch syndrome and personal history of colon cancer.  Last exam July 2021.  No complaints today including chest pain or shortness of breath.  Tolerated the prep.  Past Medical History:  Diagnosis Date   Abdominal pain    Allergy    Anemia    Anxiety    Arthritis    hands/hip, gout left ankle   Atypical nevus 10/04/2003   slight-mod-left post axilla   Blood transfusion 03/2010   at [facility]   Cataract    Colon cancer ([place]) 03/2010   Concussion    per pt, has had 2-3 concussions due to horse injuries   Depression    Fatigue    GERD (gastroesophageal reflux disease)    occasional   Heart murmur    hx -no problems   Hyperlipidemia    Interstitial cystitis    Lactose intolerance    Lynch syndrome 12/22/2011   Major depression    clinical- sees leslie brown   MVP (mitral valve prolapse)    mild-seen on echo 2008   Neuromuscular disorder ([place])    raynaud's disease hands/feet   Neuropathy    Obstructive sleep apnea    very mild   Osteoarthritis    Osteoporosis    Pelvic fracture (HCC)    while riding a horse in 2008   Peripheral neuropathy    mild- due to chemotherapy   Prinzmetal angina (HCC)    Raynaud's syndrome    hands/feet   Serrated adenoma of colon    Squamous cell carcinoma of skin 10/27/2014   KA- right inner  shin,ant  (txpbx), in situ- left top shin (txpbx)   SVD (spontaneous vaginal delivery)    x 1   Thyroid disease     Past Surgical History:  Procedure Laterality Date   ABDOMINO-VAGINAL VESICAL NECK SUSPENSION  10/2018   BUNIONECTOMY     Bil/ hammer toe on right foot   COLONOSCOPY     COLONOSCOPY WITH PROPOFOL N/A 06/10/2012   Procedure: COLONOSCOPY WITH PROPOFOL;  Surgeon: Garlan Fair, MD;  Location: [facility];  Service: Endoscopy;  Laterality: N/A;   COLONOSCOPY WITH PROPOFOL N/A 09/22/2013   Procedure: COLONOSCOPY WITH PROPOFOL;  Surgeon: Garlan Fair, MD;  Location: [facility];  Service: Endoscopy;  Laterality: N/A;   COLONOSCOPY WITH PROPOFOL N/A 08/02/2015   Procedure: COLONOSCOPY WITH PROPOFOL;  Surgeon: Garlan Fair, MD;  Location: [facility];  Service: Endoscopy;  Laterality: N/A;   ESOPHAGOGASTRODUODENOSCOPY (EGD) WITH PROPOFOL N/A 06/10/2012   Procedure: ESOPHAGOGASTRODUODENOSCOPY (EGD) WITH PROPOFOL;  Surgeon: Garlan Fair, MD;  Location: [facility];  Service: Endoscopy;  Laterality: N/A;   ESOPHAGOGASTRODUODENOSCOPY (EGD) WITH PROPOFOL N/A 09/22/2013   Procedure: ESOPHAGOGASTRODUODENOSCOPY (EGD) WITH PROPOFOL;  Surgeon: Garlan Fair, MD;  Location: [facility];  Service: Endoscopy;  Laterality: N/A;   FOOT NEUROMA SURGERY Right  interstem  12/2018   MENISCUS REPAIR Right    for meniscus tear   NASAL SINUS SURGERY  1993   OVARIAN CYST REMOVAL     Laparotomy   PARTIAL COLECTOMY  04/12/2010   POLYPECTOMY     port a cath insertion  2012   for chemo use   PORT-A-CATH REMOVAL  05/03/2011   RETINAL TEAR REPAIR CRYOTHERAPY Right    TOOTH EXTRACTION     molars   TOTAL ABDOMINAL HYSTERECTOMY W/ BILATERAL SALPINGOOPHORECTOMY  2013   UPPER GASTROINTESTINAL ENDOSCOPY     VARICOSE VEIN SURGERY Bilateral     Prior to Admission medications   Medication Sig Start Date End Date Taking? Authorizing Provider  alclomethasone (ACLOVATE) 0.05 % cream Apply once  to twice a day 12/22/20   [name] R, PA-C  Ascorbic Acid (VITAMIN C) 1000 MG tablet Take 1,000 mg by mouth 2 (two) times daily.    [provider]  ASTAXANTHIN PO Take 6 mg by mouth daily.    [provider]  augmented betamethasone dipropionate (DIPROLENE-AF) 0.05 % cream Apply topically daily. Patient not taking: Reported on 12/21/2020 12/29/19   Lavonna Monarch, MD  azithromycin (ZITHROMAX) 250 MG tablet Take 250 mg by mouth as directed. 11/29/20   [provider]  Bromfenac Sodium (PROLENSA) 0.07 % SOLN PLACE ONE DROP INTO OPERATIVE EYE ONCE DAILY, BEGINNING ONE DAY PRIOR TO SURGERY 02/17/20   [provider]  buPROPion (WELLBUTRIN XL) 300 MG 24 hr tablet Take 300 mg by mouth daily before breakfast.    [provider]  Calcium Carb-Cholecalciferol 500-100 MG-UNIT CHEW Chew by mouth daily.    [provider]  calcium carbonate (OS-CAL) 1250 (500 Ca) MG chewable tablet Chew 1 tablet by mouth daily as needed.     [provider]  cetirizine (ZYRTEC) 10 MG tablet Take 1 tablet (10 mg total) by mouth 2 (two) times daily. Patient not taking: Reported on 12/21/2020 08/16/20   Jiles Prows, MD  chlorhexidine (PERIDEX) 0.12 % solution SMARTSIG:By Mouth Patient not taking: Reported on 12/21/2020 08/23/20   [provider]  Cholecalciferol (VITAMIN D) 2000 UNITS tablet Take 2,000 Units by mouth 2 (two) times a day.     [provider]  Coenzyme Q10 (CO Q 10) 100 MG CAPS Take 100 mg by mouth daily.    [provider]  Cyanocobalamin (VITAMIN B-12) 2500 MCG SUBL Place 2,500 mcg under the tongue daily.     [provider]  cycloSPORINE (RESTASIS OP) Apply to eye. Put one drop in both eyes twice a day Patient not taking: Reported on 12/21/2020    [provider]  dexamethasone (DECADRON) 4 MG tablet Take by mouth. Patient not taking: Reported on 12/21/2020 08/23/20   [provider]   doxycycline (VIBRA-TABS) 100 MG tablet Take 100 mg by mouth daily. 08/25/20   [provider]  DULoxetine (CYMBALTA) 60 MG capsule Take 1 capsule by mouth every morning. 07/12/14   [provider]  fluconazole (DIFLUCAN) 150 MG tablet Take one tablet once a week in the months on June, July, and August 08/16/20   Kozlow, Donnamarie Poag, MD  fluocinonide (LIDEX) 0.05 % external solution Apply topically. Patient not taking: Reported on 12/21/2020 08/15/20   [provider]  fluticasone (FLONASE) 50 MCG/ACT nasal spray Place 1 spray into both nostrils daily.    [provider]  Glucosamine-Chondroit-Vit C-Mn (GLUCOSAMINE CHONDR 1500 COMPLX PO) Take 1 capsule by mouth 2 (two) times daily.  [provider]  HYDROcodone-acetaminophen (NORCO/VICODIN) 5-325 MG tablet Take by mouth. Patient not taking: Reported on 12/21/2020 08/23/20   [provider]  ibuprofen (ADVIL) 400 MG tablet Take by mouth. 08/23/20   [provider]  ibuprofen (ADVIL,MOTRIN) 200 MG tablet Take 200 mg by mouth every 6 (six) hours as needed for moderate pain.    [provider]  ketoconazole (NIZORAL) 2 % cream Apply 1 application topically 2 (two) times daily. Apply once to twice a day 12/22/20 02/02/21  [name] R, PA-C  ketoconazole (NIZORAL) 2 % shampoo Apply to scalp let it set 3-5 min and rinse out 12/22/20   [name] R, PA-C  levocetirizine (XYZAL) 5 MG tablet Take 1 tablet (5 mg total) by mouth every evening. Patient not taking: Reported on 12/21/2020 09/21/20   Warren Danes, PA-C  levothyroxine (SYNTHROID) 100 MCG tablet Take 100 mcg by mouth every other day. With 88 mcg on opposing days 06/18/15   [provider]  levothyroxine (SYNTHROID) 88 MCG tablet Take 88 mcg by mouth every morning. 09/07/19   [provider]  Magnesium 200 MG TABS Take 400 mg by mouth daily.     [provider]  metoCLOPramide (REGLAN) 10 MG tablet  Take 1 tablet (10 mg total) by mouth as directed. Patient not taking: Reported on 12/21/2020 09/16/19   Jerene Bears, MD  modafinil (PROVIGIL) 100 MG tablet Take 100 mg by mouth in the morning and at bedtime. 2 tablet (200 mg) in the morning and 1 tablet 100 mg at afternoon    [provider]  Multiple Vitamin (MULTIVITAMIN WITH MINERALS) TABS Take 1 tablet by mouth daily.    [provider]  ondansetron (ZOFRAN) 4 MG tablet Take 1 tablet (4 mg total) by mouth as directed. 1 table 30-60 minutes prior to each prep dose. 12/21/20   Yehudis Monceaux, Lajuan Lines, MD  PEG-KCl-NaCl-NaSulf-Na Asc-C (PLENVU) 140 g SOLR Take 1 kit by mouth as directed. 12/21/20   Ishmail Mcmanamon, Lajuan Lines, MD  Polyethyl Glycol-Propyl Glycol 0.4-0.3 % SOLN Apply 1 drop to eye 3 (three) times daily as needed (dry eyes).    [provider]  Probiotic Product (VISBIOME HIGH POTENCY) CAPS Take 1 tablet by mouth daily. 12/03/17   [provider]  tacrolimus (PROTOPIC) 0.1 % ointment Apply 1 application topically at bedtime. Applies for dermatitis.    [provider]    Current Outpatient Medications  Medication Sig Dispense Refill   alclomethasone (ACLOVATE) 0.05 % cream Apply once to twice a day 45 g 1   Ascorbic Acid (VITAMIN C) 1000 MG tablet Take 1,000 mg by mouth 2 (two) times daily.     ASTAXANTHIN PO Take 6 mg by mouth daily.     augmented betamethasone dipropionate (DIPROLENE-AF) 0.05 % cream Apply topically daily. (Patient not taking: Reported on 12/21/2020) 50 g 1   azithromycin (ZITHROMAX) 250 MG tablet Take 250 mg by mouth as directed.     Bromfenac Sodium (PROLENSA) 0.07 % SOLN PLACE ONE DROP INTO OPERATIVE EYE ONCE DAILY, BEGINNING ONE DAY PRIOR TO SURGERY     buPROPion (WELLBUTRIN XL) 300 MG 24 hr tablet Take 300 mg by mouth daily before breakfast.     Calcium Carb-Cholecalciferol 500-100 MG-UNIT CHEW Chew by mouth daily.     calcium carbonate (OS-CAL) 1250 (500 Ca) MG chewable tablet Chew 1  tablet by mouth daily as needed.      cetirizine (ZYRTEC) 10 MG tablet Take 1 tablet (10  mg total) by mouth 2 (two) times daily. (Patient not taking: Reported on 12/21/2020) 60 tablet 5   chlorhexidine (PERIDEX) 0.12 % solution SMARTSIG:By Mouth (Patient not taking: Reported on 12/21/2020)     Cholecalciferol (VITAMIN D) 2000 UNITS tablet Take 2,000 Units by mouth 2 (two) times a day.      Coenzyme Q10 (CO Q 10) 100 MG CAPS Take 100 mg by mouth daily.     Cyanocobalamin (VITAMIN B-12) 2500 MCG SUBL Place 2,500 mcg under the tongue daily.      cycloSPORINE (RESTASIS OP) Apply to eye. Put one drop in both eyes twice a day (Patient not taking: Reported on 12/21/2020)     dexamethasone (DECADRON) 4 MG tablet Take by mouth. (Patient not taking: Reported on 12/21/2020)     doxycycline (VIBRA-TABS) 100 MG tablet Take 100 mg by mouth daily.     DULoxetine (CYMBALTA) 60 MG capsule Take 1 capsule by mouth every morning.     fluconazole (DIFLUCAN) 150 MG tablet Take one tablet once a week in the months on June, July, and August 20 tablet 1   fluocinonide (LIDEX) 0.05 % external solution Apply topically. (Patient not taking: Reported on 12/21/2020)     fluticasone (FLONASE) 50 MCG/ACT nasal spray Place 1 spray into both nostrils daily.     Glucosamine-Chondroit-Vit C-Mn (GLUCOSAMINE CHONDR 1500 COMPLX PO) Take 1 capsule by mouth 2 (two) times daily.      HYDROcodone-acetaminophen (NORCO/VICODIN) 5-325 MG tablet Take by mouth. (Patient not taking: Reported on 12/21/2020)     ibuprofen (ADVIL) 400 MG tablet Take by mouth.     ibuprofen (ADVIL,MOTRIN) 200 MG tablet Take 200 mg by mouth every 6 (six) hours as needed for moderate pain.     ketoconazole (NIZORAL) 2 % cream Apply 1 application topically 2 (two) times daily. Apply once to twice a day 30 g 1   ketoconazole (NIZORAL) 2 % shampoo Apply to scalp let it set 3-5 min and rinse out 120 mL 2   levocetirizine (XYZAL) 5 MG tablet Take 1 tablet (5 mg total) by  mouth every evening. (Patient not taking: Reported on 12/21/2020) 90 tablet 3   levothyroxine (SYNTHROID) 100 MCG tablet Take 100 mcg by mouth every other day. With 88 mcg on opposing days     levothyroxine (SYNTHROID) 88 MCG tablet Take 88 mcg by mouth every morning.     Magnesium 200 MG TABS Take 400 mg by mouth daily.      metoCLOPramide (REGLAN) 10 MG tablet Take 1 tablet (10 mg total) by mouth as directed. (Patient not taking: Reported on 12/21/2020) 2 tablet 0   modafinil (PROVIGIL) 100 MG tablet Take 100 mg by mouth in the morning and at bedtime. 2 tablet (200 mg) in the morning and 1 tablet 100 mg at afternoon     Multiple Vitamin (MULTIVITAMIN WITH MINERALS) TABS Take 1 tablet by mouth daily.     ondansetron (ZOFRAN) 4 MG tablet Take 1 tablet (4 mg total) by mouth as directed. 1 table 30-60 minutes prior to each prep dose. 2 tablet 0   PEG-KCl-NaCl-NaSulf-Na Asc-C (PLENVU) 140 g SOLR Take 1 kit by mouth as directed. 1 each 0   Polyethyl Glycol-Propyl Glycol 0.4-0.3 % SOLN Apply 1 drop to eye 3 (three) times daily as needed (dry eyes).     Probiotic Product (VISBIOME HIGH POTENCY) CAPS Take 1 tablet by mouth daily.     tacrolimus (PROTOPIC) 0.1 % ointment Apply 1 application topically at bedtime. Applies  for dermatitis.     Current Facility-Administered Medications  Medication Dose Route Frequency Provider Last Rate Last Admin   0.9 %  sodium chloride infusion  500 mL Intravenous Once Dontray Haberland, Lajuan Lines, MD        Allergies as of 01/04/2021 - Review Complete 01/02/2021  Allergen Reaction Noted   Atorvastatin  04/18/2020   Other  04/18/2020   Pitavastatin  04/18/2020   Simvastatin  04/18/2020    Family History  Problem Relation Age of Onset   Heart disease Mother    Heart attack Mother    Heart disease Father    Heart attack Father    Heart disease Brother    Heart attack Brother    Other Brother        Field seismologist syndrome-PMS2   Cancer - Other Brother    Colon polyps Brother     Other Brother        Lynch syndrome-PMS2   Sleep apnea Brother    Heart disease Maternal Grandmother    Heart attack Maternal Grandmother    Heart disease Maternal Grandfather    Heart attack Maternal Grandfather    Alzheimer's disease Paternal Grandmother    Emphysema Paternal Grandfather    Other Son        lynch syndrome-PMS2   Colon polyps Son    Colon cancer Other    Esophageal cancer Neg Hx    Stomach cancer Neg Hx    Rectal cancer Neg Hx     Social History   Socioeconomic History   Marital status: Married    Spouse name: Not on file   Number of children: 1   Years of education: Not on file   Highest education level: Not on file  Occupational History   Occupation: retired Pharmacist, hospital  Tobacco Use   Smoking status: Never   Smokeless tobacco: Never   Tobacco comments:    high school years 1973 - tried  to smoke and did not take or like it   Scientific laboratory technician Use: Never used  Substance and Sexual Activity   Alcohol use: No   Drug use: No   Sexual activity: Yes    Birth control/protection: Post-menopausal  Other Topics Concern   Not on file  Social History Narrative   Not on file   Social Determinants of Health   Financial Resource Strain: Not on file  Food Insecurity: Not on file  Transportation Needs: Not on file  Physical Activity: Not on file  Stress: Not on file  Social Connections: Not on file  Intimate Partner Violence: Not on file    Physical Exam: Vital signs in last 24 hours: @BP  124/78   Pulse 90   Temp (!) 97.3 F (36.3 C)   Ht 5' 6.5" (1.689 m)   Wt 142 lb (64.4 kg)   SpO2 96%   BMI 22.58 kg/m  GEN: NAD EYE: Sclerae anicteric ENT: MMM CV: Non-tachycardic Pulm: CTA b/l GI: Soft, NT/ND NEURO:  Alert & Oriented x 3   Zenovia Jarred, MD [facility]  01/04/2021 10:45 AM

## 2021-01-04 NOTE — Progress Notes (Signed)
Called to room to assist during endoscopic procedure.  Patient ID and intended procedure confirmed with present staff. Received instructions for my participation in the procedure from the performing physician.  

## 2021-01-04 NOTE — Progress Notes (Signed)
[name] - VS  Pt's states no medical or surgical changes since previsit or office visit.

## 2021-01-05 ENCOUNTER — Telehealth: Payer: Self-pay

## 2021-01-05 NOTE — Telephone Encounter (Signed)
Left message for pt to call back to schedule capsule endo.  01/17/21- Called pt to try and schedule VCE, pt states she just cannot schedule it at this time. Pt states she has PTSD from all the preps she has done for colons. Pt states she would like to put it off until January. Pt instructed to call back in Jan to schedule the VCE.

## 2021-01-06 ENCOUNTER — Telehealth: Payer: Self-pay

## 2021-01-06 NOTE — Telephone Encounter (Signed)
  Follow up Call-  Call back number 01/04/2021 10/05/2019 09/04/2018  Post procedure Call Back phone  # [phone] hm [phone]  Permission to leave phone message Yes Yes Yes  Some recent data might be hidden     Patient questions:  Do you have a fever, pain , or abdominal swelling? No. Pain Score  0 *  Have you tolerated food without any problems? Yes.    Have you been able to return to your normal activities? Yes.    Do you have any questions about your discharge instructions: Diet   No. Medications  No. Follow up visit  No.  Do you have questions or concerns about your Care? No.  Actions: * If pain score is 4 or above: No action needed, pain <4.

## 2021-01-10 ENCOUNTER — Encounter: Payer: Self-pay | Admitting: Internal Medicine

## 2021-02-06 ENCOUNTER — Encounter: Payer: Self-pay | Admitting: Dermatology

## 2021-02-06 ENCOUNTER — Ambulatory Visit (INDEPENDENT_AMBULATORY_CARE_PROVIDER_SITE_OTHER): Payer: Medicare Other | Admitting: Dermatology

## 2021-02-06 ENCOUNTER — Other Ambulatory Visit: Payer: Self-pay

## 2021-02-06 DIAGNOSIS — L570 Actinic keratosis: Secondary | ICD-10-CM | POA: Diagnosis not present

## 2021-02-06 MED ORDER — AMINOLEVULINIC ACID HCL 10 % EX GEL
2000.0000 mg | Freq: Once | CUTANEOUS | Status: AC
  Administered 2021-02-06: 09:00:00 2000 mg via TOPICAL

## 2021-02-06 NOTE — Patient Instructions (Signed)

## 2021-02-21 ENCOUNTER — Ambulatory Visit: Payer: Medicare Other | Admitting: Physician Assistant

## 2021-02-25 ENCOUNTER — Encounter: Payer: Self-pay | Admitting: Dermatology

## 2021-02-25 NOTE — Progress Notes (Signed)
° °  Follow-Up Visit   Subjective  Angela Hart is a 70 y.o. female who presents for the following: Photodynamic Therapy (face).  Facial actinic keratoses, for PDT Location:  Duration:  Quality:  Associated Signs/Symptoms: Modifying Factors:  Severity:  Timing: Context:   Objective  Well appearing patient in no apparent distress; mood and affect are within normal limits. Head - Anterior (Face) Multiple nonhypertrophic actinic keratoses.    Head and neck examined.   Assessment & Plan    AK (actinic keratosis) Head - Anterior (Face)  Multiple treatment options discussed including observation, spot therapy, field therapy with topical fluorouracil, and red/blue light PDT.  Patient comfortable with proceeding with PDT.  Photodynamic therapy - Head - Anterior (Face) Procedure discussed: discussed risks, benefits, side effects. and alternatives   Prep: site scrubbed/prepped with acetone   Type of treatment:  Blue light Aminolevulinic Acid (see MAR for details): Ameluz Amount of Ameluz (mg):  2000 Cooling:  Fan and floor fan Outcome: patient tolerated procedure well with no complications   Post-procedure details: sunscreen applied and aftercare instructions given to patient    Aminolevulinic Acid HCl 10 % GEL 2,000 mg - Head - Anterior (Face)       I, Lavonna Monarch, MD, have reviewed all documentation for this visit.  The documentation on 02/25/21 for the exam, diagnosis, procedures, and orders are all accurate and complete.

## 2021-03-21 ENCOUNTER — Other Ambulatory Visit: Payer: Self-pay | Admitting: Allergy and Immunology

## 2021-03-22 ENCOUNTER — Ambulatory Visit (INDEPENDENT_AMBULATORY_CARE_PROVIDER_SITE_OTHER): Payer: Medicare Other | Admitting: Physician Assistant

## 2021-03-22 ENCOUNTER — Encounter: Payer: Self-pay | Admitting: Physician Assistant

## 2021-03-22 ENCOUNTER — Other Ambulatory Visit: Payer: Self-pay

## 2021-03-22 DIAGNOSIS — L080 Pyoderma: Secondary | ICD-10-CM | POA: Diagnosis not present

## 2021-03-22 MED ORDER — FLUCONAZOLE 200 MG PO TABS: ORAL_TABLET | ORAL | 3 refills | Status: DC

## 2021-03-22 NOTE — Progress Notes (Signed)
° °  Follow-Up Visit   Subjective  Angela Hart is a 71 y.o. female who presents for the following: Skin Problem (Patient here today for new lesions on her scalp x 1 month, per patient the lesions do bleed, the lesions are painful, and crusty. Patient is using Ketoconazole shampoo and Fluocinonide Solution per patient no improvement. ). She is worried about them being skin cancers. She has had numerous nonmole skin cancers on her legs and has treated her face extensively with fluorouracil cream for actinic keratoses.    The following portions of the chart were reviewed this encounter and updated as appropriate:  Tobacco   Allergies   Meds   Problems   Med Hx   Surg Hx   Fam Hx       Objective  Well appearing patient in no apparent distress; mood and affect are within normal limits.  All skin waist up examined.  Scalp Excoriated plaques.         Assessment & Plan  Pyoderma Scalp  fluconazole (DIFLUCAN) 200 MG tablet - Scalp Take one tablet every 3 days  Anaerobic and Aerobic Culture - Scalp    I, Jasalyn Frysinger, PA-C, have reviewed all documentation's for this visit.  The documentation on 03/22/21 for the exam, diagnosis, procedures and orders are all accurate and complete.

## 2021-03-28 LAB — ANAEROBIC AND AEROBIC CULTURE
MICRO NUMBER:: 12856893
MICRO NUMBER:: 12856894
SPECIMEN QUALITY:: ADEQUATE
SPECIMEN QUALITY:: ADEQUATE

## 2021-04-03 ENCOUNTER — Telehealth: Payer: Self-pay | Admitting: Physician Assistant

## 2021-04-03 NOTE — Telephone Encounter (Signed)
Patient wants to talk with [name], PA-C about results of culture.  Patient was given culture results, but the way she is reading it she insists that there is some bacterial growth.  Patient states that she cannot keep coming for separate appointments and wants to come in for 1 appointment to address all her problems.  I explained that that is just not possible since her problems take time individually.  Patient also states that if she needs to be sent somewhere else for these problems to be addressed she is willing to go.  Patient wants to be seen prior to the 05/11/2021 appointment to address her culture results and does not want the 05/11/2021 appointment reason to be changed since that needs to be addressed as well.

## 2021-04-03 NOTE — Telephone Encounter (Signed)
Phone call to patient to see what else I can help her with. Per patient she believes that the Fluconazole is causing her to have hair loss and causing the scalp issues. Patient thought that it was from her having [place] in June but she stopped the Fluconazole and per patient the hair loss has stopped. Patient wants to know what else she can take for the scalp issue, she is currently using Ketoconazole Shampoo x 6 weeks and it's not helping at all. Per patient she has been on the Fluconazole x 6 months and she thought that she was going to need antibiotics so she was going to need to take the Fluconazole. I informed patient that [name], PA is out of the office today and I will get this message to her and someone will call her tomorrow with [name], PA's recommendations.

## 2021-04-04 ENCOUNTER — Encounter: Payer: Self-pay | Admitting: Physician Assistant

## 2021-04-04 MED ORDER — FLUOCINOLONE ACETONIDE SCALP 0.01 % EX OIL: 1.0000 "application " | TOPICAL_OIL | Freq: Every evening | CUTANEOUS | 4 refills | Status: DC

## 2021-04-04 NOTE — Addendum Note (Signed)
Addended by: Lennie Odor on: 04/04/2021 12:12 PM   Modules accepted: Orders

## 2021-04-04 NOTE — Telephone Encounter (Signed)
Phone call to patient with [facility] recommendations. Patient aware.

## 2021-05-10 ENCOUNTER — Other Ambulatory Visit: Payer: Self-pay

## 2021-05-10 ENCOUNTER — Ambulatory Visit (INDEPENDENT_AMBULATORY_CARE_PROVIDER_SITE_OTHER): Payer: Medicare Other | Admitting: Physician Assistant

## 2021-05-10 ENCOUNTER — Encounter: Payer: Self-pay | Admitting: Physician Assistant

## 2021-05-10 DIAGNOSIS — L219 Seborrheic dermatitis, unspecified: Secondary | ICD-10-CM | POA: Diagnosis not present

## 2021-05-10 MED ORDER — FLUOCINOLONE ACETONIDE SCALP 0.01 % EX OIL: 1.0000 "application " | TOPICAL_OIL | Freq: Every evening | CUTANEOUS | 11 refills | Status: DC

## 2021-05-11 ENCOUNTER — Ambulatory Visit: Payer: Medicare Other | Admitting: Physician Assistant

## 2021-05-22 ENCOUNTER — Encounter: Payer: Self-pay | Admitting: Physician Assistant

## 2021-05-22 NOTE — Progress Notes (Signed)
? ?  Follow-Up Visit ?  ?Subjective  ?Angela Hart is a 71 y.o. female who presents for the following: Follow-up (F/u PDT- good reaction ). ? ? ?The following portions of the chart were reviewed this encounter and updated as appropriate:  Tobacco  Allergies  Meds  Problems  Med Hx  Surg Hx  Fam Hx   ?  ? ?Objective  ?Well appearing patient in no apparent distress; mood and affect are within normal limits. ? ?A full examination was performed including scalp, head, eyes, ears, nose, lips, neck, chest, axillae, abdomen, back, buttocks, bilateral upper extremities, bilateral lower extremities, hands, feet, fingers, toes, fingernails, and toenails. All findings within normal limits unless otherwise noted below. ? ?scalp and face ?Thin scaly erythematous papules coalescing to plaques. Scale improved with ketoconazole shampoo and dermasmooth fs oil.  ? ? ?Assessment & Plan  ?Seborrheic dermatitis ?scalp and face ? ?Fluocinolone Acetonide Scalp 0.01 % OIL - scalp and face ?Apply 1 application topically at bedtime. ? ?Related Medications ?alclomethasone (ACLOVATE) 0.05 % cream ?Apply once to twice a day ? ?ketoconazole (NIZORAL) 2 % shampoo ?Apply to scalp let it set 3-5 min and rinse out ? ? ? ?I, Jian Hodgman, PA-C, have reviewed all documentation's for this visit.  The documentation on 05/22/21 for the exam, diagnosis, procedures and orders are all accurate and complete. ?

## 2021-07-22 ENCOUNTER — Other Ambulatory Visit: Payer: Self-pay | Admitting: Physician Assistant

## 2021-07-22 DIAGNOSIS — L219 Seborrheic dermatitis, unspecified: Secondary | ICD-10-CM

## 2021-07-24 ENCOUNTER — Telehealth: Payer: Self-pay | Admitting: Physician Assistant

## 2021-07-24 NOTE — Telephone Encounter (Signed)
Left message for patient to return our phone call.

## 2021-07-24 NOTE — Telephone Encounter (Signed)
Patient left message on office voice mail that she wants to be seen as soon as possible for a bleeding and rapidly growing lesion beside nose. ?

## 2021-09-14 ENCOUNTER — Ambulatory Visit: Payer: Medicare Other | Admitting: Physician Assistant

## 2021-10-18 ENCOUNTER — Telehealth: Payer: Self-pay | Admitting: Internal Medicine

## 2021-10-18 ENCOUNTER — Encounter: Payer: Self-pay | Admitting: Internal Medicine

## 2021-10-18 NOTE — Telephone Encounter (Signed)
Pt scheduled for previsit and colon. She never called back to schedule VCE. She wants to know if she needs and EGD with the colon and if she still needs to do capsule. Please advise.

## 2021-10-18 NOTE — Telephone Encounter (Signed)
Patient called wanting to schedule her colonoscopy, endoscopy and capsule endoscopy. Patient had a recall in for her colonoscopy, previous telephone encounter stated patient would call back to schedule VCE. Patient was scheduled to have PV on 9/18 at 3:00 and colonoscopy on  10/9 1:30. Patient is requesting a call to discuss. Please advise.

## 2021-10-18 NOTE — Telephone Encounter (Signed)
Appt changed to endo/colon. Capsule endo will be scheduled sometime after the ECL.

## 2021-10-18 NOTE — Telephone Encounter (Signed)
If last EGD was 2018, upper endoscopy should be repeated at time of colonoscopy followed on another day by capsule endoscopy Dx: lynch syndrome

## 2021-10-19 ENCOUNTER — Ambulatory Visit: Payer: Medicare Other | Admitting: Physician Assistant

## 2021-10-19 NOTE — Telephone Encounter (Signed)
Preps are a problem for this pt, states she has PTSD from preps. Pt wants to know if she could do the VCE the day after her ECL and that way she could just stay on clear liquids over night and not have to do another prep. Please advise.

## 2021-10-19 NOTE — Telephone Encounter (Signed)
Yes, that is a fine plan

## 2021-10-24 NOTE — Telephone Encounter (Unsigned)
Pt scheduled for capsule endo 12/19/21 the day after her ECL, this way she can just stay on clear liquids and not have to do another prep.  Pt aware of appt, amb referral in epic.

## 2021-10-26 ENCOUNTER — Other Ambulatory Visit: Payer: Self-pay

## 2021-10-26 DIAGNOSIS — Z1509 Genetic susceptibility to other malignant neoplasm: Secondary | ICD-10-CM

## 2021-10-26 DIAGNOSIS — Z85038 Personal history of other malignant neoplasm of large intestine: Secondary | ICD-10-CM

## 2021-11-15 ENCOUNTER — Telehealth: Payer: Self-pay | Admitting: *Deleted

## 2021-11-15 NOTE — Telephone Encounter (Signed)
Dr.Pyrtle,  This patient is for ECL on 10/9. Per her medication list she is currently taking Nattokinase (herbal blood thinner). Does this medication need to be held for procedures? Please advise.  Thank you, Jamira Barfuss pv

## 2021-11-19 NOTE — Telephone Encounter (Signed)
Would hold x 1 week before procedure

## 2021-11-20 NOTE — Telephone Encounter (Signed)
Noted. Thanks.

## 2021-11-27 ENCOUNTER — Ambulatory Visit (AMBULATORY_SURGERY_CENTER): Payer: Medicare Other | Admitting: *Deleted

## 2021-11-27 ENCOUNTER — Ambulatory Visit: Payer: Medicare Other | Admitting: Dermatology

## 2021-11-27 VITALS — Ht 66.5 in | Wt 147.0 lb

## 2021-11-27 DIAGNOSIS — Z85038 Personal history of other malignant neoplasm of large intestine: Secondary | ICD-10-CM

## 2021-11-27 DIAGNOSIS — Z86010 Personal history of colonic polyps: Secondary | ICD-10-CM

## 2021-11-27 DIAGNOSIS — Z1509 Genetic susceptibility to other malignant neoplasm: Secondary | ICD-10-CM

## 2021-11-27 MED ORDER — PLENVU 140 G PO SOLR: 1.0000 | Freq: Once | ORAL | 0 refills | Status: AC

## 2021-11-27 MED ORDER — ONDANSETRON HCL 4 MG PO TABS: ORAL_TABLET | ORAL | 0 refills | Status: DC

## 2021-11-27 NOTE — Progress Notes (Signed)
No egg or soy allergy known to patient  No issues known to pt with past sedation with any surgeries or procedures Patient denies ever being told they had issues or difficulty with intubation  No FH of Malignant Hyperthermia Pt is not on diet pills Pt is not on  home 02  Pt is not on blood thinners  Pt denies issues with constipation  Pt encouraged to use to use Singlecare or Goodrx to reduce cost  Pt's previsit is done over the phone and all paperwork (prep instructions, blank consent form to just read over) sent to patient via [facility].  Pt's name and DOB verified at the beginning of the previsit.  Pt denies any difficulty with ambulating.

## 2021-12-18 ENCOUNTER — Ambulatory Visit (AMBULATORY_SURGERY_CENTER): Payer: Medicare Other | Admitting: Internal Medicine

## 2021-12-18 ENCOUNTER — Encounter: Payer: Self-pay | Admitting: Internal Medicine

## 2021-12-18 VITALS — BP 138/64 | HR 60 | Temp 97.1°F | Resp 10 | Ht 66.5 in | Wt 147.0 lb

## 2021-12-18 DIAGNOSIS — Z85038 Personal history of other malignant neoplasm of large intestine: Secondary | ICD-10-CM | POA: Diagnosis not present

## 2021-12-18 DIAGNOSIS — D124 Benign neoplasm of descending colon: Secondary | ICD-10-CM

## 2021-12-18 DIAGNOSIS — K317 Polyp of stomach and duodenum: Secondary | ICD-10-CM

## 2021-12-18 DIAGNOSIS — K319 Disease of stomach and duodenum, unspecified: Secondary | ICD-10-CM | POA: Diagnosis not present

## 2021-12-18 DIAGNOSIS — Z1509 Genetic susceptibility to other malignant neoplasm: Secondary | ICD-10-CM

## 2021-12-18 DIAGNOSIS — Z08 Encounter for follow-up examination after completed treatment for malignant neoplasm: Secondary | ICD-10-CM

## 2021-12-18 DIAGNOSIS — D123 Benign neoplasm of transverse colon: Secondary | ICD-10-CM

## 2021-12-18 MED ORDER — SODIUM CHLORIDE 0.9 % IV SOLN: 500.0000 mL | INTRAVENOUS | Status: DC

## 2021-12-18 NOTE — Op Note (Signed)
[place] Patient Name: Angela Hart Procedure Date: 12/18/2021 12:18 PM MRN: [id number] Endoscopist: Jerene Bears , MD Age: 71 Referring MD:  Date of Birth: 1950/04/25 Gender: Female Account #: [id number] Procedure:                Colonoscopy Indications:              High risk colon cancer surveillance: Personal                            history of colon cancer, Lynch Syndrome, last                            colonoscopy Oct 2022 Medicines:                Monitored Anesthesia Care Procedure:                Pre-Anesthesia Assessment:                           - Prior to the procedure, a History and Physical                            was performed, and patient medications and                            allergies were reviewed. The patient's tolerance of                            previous anesthesia was also reviewed. The risks                            and benefits of the procedure and the sedation                            options and risks were discussed with the patient.                            All questions were answered, and informed consent                            was obtained. Prior Anticoagulants: The patient has                            taken no previous anticoagulant or antiplatelet                            agents. ASA Grade Assessment: II - A patient with                            mild systemic disease. After reviewing the risks                            and benefits, the patient was deemed in  satisfactory condition to undergo the procedure.                           After obtaining informed consent, the colonoscope                            was passed under direct vision. Throughout the                            procedure, the patient's blood pressure, pulse, and                            oxygen saturations were monitored continuously. The                            Olympus PCF-H190DL [id number]) Colonoscope was                             introduced through the anus and advanced to the                            cecum, identified by appendiceal orifice and                            ileocecal valve. The colonoscopy was performed                            without difficulty. The patient tolerated the                            procedure well. The quality of the bowel                            preparation was good. The ileocecal valve,                            appendiceal orifice, and rectum were photographed. Scope In: 1:44:08 PM Scope Out: 2:08:29 PM Scope Withdrawal Time: 0 hours 17 minutes 45 seconds  Total Procedure Duration: 0 hours 24 minutes 21 seconds  Findings:                 The digital rectal exam was normal.                           A 7 mm polyp was found in the transverse colon. The                            polyp was sessile. The polyp was removed with a                            cold snare. Resection and retrieval were complete.                           A 5 mm polyp was found in the descending colon.  The                            polyp was sessile. The polyp was removed with a                            cold snare. Resection was complete, but the polyp                            tissue was not retrieved.                           There was evidence of a prior end-to-side                            colo-colonic anastomosis in the transverse colon.                            This was patent and was characterized by healthy                            appearing mucosa.                           Multiple small and large-mouthed diverticula were                            found in the sigmoid colon, descending colon and                            transverse colon.                           The retroflexed view of the distal rectum and anal                            verge was normal and showed no anal or rectal                            abnormalities. Complications:             No immediate complications. Estimated Blood Loss:     Estimated blood loss was minimal. Impression:               - One 7 mm polyp in the transverse colon, removed                            with a cold snare. Resected and retrieved.                           - One 5 mm polyp in the descending colon, removed                            with a cold snare. Complete resection. Polyp tissue  not retrieved.                           - Patent end-to-side colo-colonic anastomosis,                            characterized by healthy appearing mucosa.                           - Mild diverticulosis in the sigmoid colon, in the                            descending colon and in the transverse colon.                           - The distal rectum and anal verge are normal on                            retroflexion view. Recommendation:           - Patient has a contact number available for                            emergencies. The signs and symptoms of potential                            delayed complications were discussed with the                            patient. Return to normal activities tomorrow.                            Written discharge instructions were provided to the                            patient.                           - Resume previous diet.                           - Continue present medications.                           - Await pathology results.                           - Repeat colonoscopy in 1 year for surveillance. Jerene Bears, MD 12/18/2021 2:20:23 PM This report has been signed electronically.

## 2021-12-18 NOTE — Patient Instructions (Addendum)
- Patient has a contact number available for emergencies. The signs and symptoms of potential delayed complications were discussed with the patient. Return to normal activities tomorrow. Written discharge instructions were provided to the patient. - Clear liquid diet for VCE tomorrow. - Continue present medications. - Await pathology results. - See the other procedure note for documentation of additional recommendations. - Repeat colonoscopy in 1 year for surveillance.  Handouts on polyps and diverticulosis given.  YOU HAD AN ENDOSCOPIC PROCEDURE TODAY AT [facility]:   Refer to the procedure report that was given to you for any specific questions about what was found during the examination.  If the procedure report does not answer your questions, please call your gastroenterologist to clarify.  If you requested that your care partner not be given the details of your procedure findings, then the procedure report has been included in a sealed envelope for you to review at your convenience later.  YOU SHOULD EXPECT: Some feelings of bloating in the abdomen. Passage of more gas than usual.  Walking can help get rid of the air that was put into your GI tract during the procedure and reduce the bloating. If you had a lower endoscopy (such as a colonoscopy or flexible sigmoidoscopy) you may notice spotting of blood in your stool or on the toilet paper. If you underwent a bowel prep for your procedure, you may not have a normal bowel movement for a few days.  Please Note:  You might notice some irritation and congestion in your nose or some drainage.  This is from the oxygen used during your procedure.  There is no need for concern and it should clear up in a day or so.  SYMPTOMS TO REPORT IMMEDIATELY:  Following lower endoscopy (colonoscopy or flexible sigmoidoscopy):  Excessive amounts of blood in the stool  Significant tenderness or worsening of abdominal pains  Swelling of the  abdomen that is new, acute  Fever of 100F or higher  Following upper endoscopy (EGD)  Vomiting of blood or coffee ground material  New chest pain or pain under the shoulder blades  Painful or persistently difficult swallowing  New shortness of breath  Fever of 100F or higher  Black, tarry-looking stools  For urgent or emergent issues, a gastroenterologist can be reached at any hour by calling [phone]. Do not use MyChart messaging for urgent concerns.    DIET:  We do recommend a small meal at first, but then you may proceed to your regular diet.  Drink plenty of fluids but you should avoid alcoholic beverages for 24 hours.  ACTIVITY:  You should plan to take it easy for the rest of today and you should NOT DRIVE or use heavy machinery until tomorrow (because of the sedation medicines used during the test).    FOLLOW UP: Our staff will call the number listed on your records the next business day following your procedure.  We will call around 7:15- 8:00 am to check on you and address any questions or concerns that you may have regarding the information given to you following your procedure. If we do not reach you, we will leave a message.     If any biopsies were taken you will be contacted by phone or by letter within the next 1-3 weeks.  Please call us at [phone] if you have not heard about the biopsies in 3 weeks.    SIGNATURES/CONFIDENTIALITY: You and/or your care partner have signed paperwork which will  be entered into your electronic medical record.  These signatures attest to the fact that that the information above on your After Visit Summary has been reviewed and is understood.  Full responsibility of the confidentiality of this discharge information lies with you and/or your care-partner.

## 2021-12-18 NOTE — Progress Notes (Signed)
Pt's states no medical or surgical changes since previsit or office visit. 

## 2021-12-18 NOTE — Op Note (Signed)
[place] Patient Name: Angela Hart Procedure Date: 12/18/2021 12:23 PM MRN: [id number] Endoscopist: Jerene Bears , MD Age: 71 Referring MD:  Date of Birth: 04/18/1950 Gender: Female Account #: [id number] Procedure:                Upper GI endoscopy Indications:              Hereditary nonpolyposis colorectal cancer (Lynch                            Syndrome), last EGD 2018 Medicines:                Monitored Anesthesia Care Procedure:                Pre-Anesthesia Assessment:                           - Prior to the procedure, a History and Physical                            was performed, and patient medications and                            allergies were reviewed. The patient's tolerance of                            previous anesthesia was also reviewed. The risks                            and benefits of the procedure and the sedation                            options and risks were discussed with the patient.                            All questions were answered, and informed consent                            was obtained. Prior Anticoagulants: The patient has                            taken no previous anticoagulant or antiplatelet                            agents. ASA Grade Assessment: II - A patient with                            mild systemic disease. After reviewing the risks                            and benefits, the patient was deemed in                            satisfactory condition to undergo the procedure.  After obtaining informed consent, the endoscope was                            passed under direct vision. Throughout the                            procedure, the patient's blood pressure, pulse, and                            oxygen saturations were monitored continuously. The                            Endoscope was introduced through the mouth, and                            advanced to the second part of  duodenum. The upper                            GI endoscopy was accomplished without difficulty.                            The patient tolerated the procedure well. Scope In: Scope Out: Findings:                 The examined esophagus was normal.                           Multiple small sessile polyps (all < 1 cm) were                            found in the gastric fundus and in the gastric                            body. Several had the appearance of small                            hyperplastic gastric polyps while the vast majority                            appeared like fundic gland polyps. Sampling                            biopsies were taken from multiple polyps with a                            cold forceps for histology.                           The exam of the stomach was otherwise normal.                           Biopsies were taken with a cold forceps in the  gastric body, at the incisura and in the gastric                            antrum for histology and Helicobacter pylori                            testing.                           Localized nodular mucosa was found in the duodenal                            bulb. Biopsies were taken with a cold forceps for                            histology.                           The exam of the duodenum was otherwise normal. Complications:            No immediate complications. Estimated Blood Loss:     Estimated blood loss was minimal. Impression:               - Normal esophagus.                           - Multiple gastric polyps. Benign in appearance and                            all < 1 cm. Sample biopsied.                           - Localized nodular mucosa in the duodenal bulb.                            Biopsied.                           - Examined duodenum otherwise normal.                           - Biopsies were taken with a cold forceps for                            histology  and Helicobacter pylori testing. Recommendation:           - Patient has a contact number available for                            emergencies. The signs and symptoms of potential                            delayed complications were discussed with the                            patient. Return to normal activities tomorrow.  Written discharge instructions were provided to the                            patient.                           - Clear liquid diet for VCE tomorrow.                           - Continue present medications.                           - Await pathology results.                           - See the other procedure note for documentation of                            additional recommendations. Jerene Bears, MD 12/18/2021 2:17:15 PM This report has been signed electronically.

## 2021-12-18 NOTE — Progress Notes (Signed)
A and O x3. Report to RN. Tolerated MAC anesthesia well.Teeth unchanged after procedure. 

## 2021-12-18 NOTE — Progress Notes (Signed)
Called to room to assist during endoscopic procedure.  Patient ID and intended procedure confirmed with present staff. Received instructions for my participation in the procedure from the performing physician.  

## 2021-12-18 NOTE — Progress Notes (Signed)
GASTROENTEROLOGY PROCEDURE H&P NOTE   Primary Care Physician: [name], MD    Reason for Procedure:   Lynch syndrome  Plan:    EGD/colon  Patient is appropriate for endoscopic procedure(s) in the ambulatory ([place]) setting.  The nature of the procedure, as well as the risks, benefits, and alternatives were carefully and thoroughly reviewed with the patient. Ample time for discussion and questions allowed. The patient understood, was satisfied, and agreed to proceed.     HPI: Angela Hart is a 71 y.o. female who presents for EGD/colonoscopy.  Medical history as below.  Tolerated the prep.  No recent chest pain or shortness of breath.  No abdominal pain today.  Past Medical History:  Diagnosis Date   Abdominal pain    Allergy    Anemia    Anxiety    Arthritis    hands/hip, gout left ankle   Atypical nevus 10/04/2003   slight-mod-left post axilla   Blood transfusion 03/2010   at [facility]   Cataract    Colon cancer ([place]) 03/2010   Concussion    per pt, has had 2-3 concussions due to horse injuries   Depression    Fatigue    GERD (gastroesophageal reflux disease)    occasional   Heart murmur    hx -no problems   Hyperlipidemia    Interstitial cystitis    Lactose intolerance    Lynch syndrome 12/22/2011   Major depression    clinical- sees leslie brown   MVP (mitral valve prolapse)    mild-seen on echo 2008   Neuromuscular disorder ([place])    raynaud's disease hands/feet   Neuropathy    Obstructive sleep apnea    very mild   Osteoarthritis    Osteoporosis    Pelvic fracture (HCC)    while riding a horse in 2008   Peripheral neuropathy    mild- due to chemotherapy   Prinzmetal angina (HCC)    Raynaud's syndrome    hands/feet   Serrated adenoma of colon    Squamous cell carcinoma of skin 10/27/2014   KA- right inner shin,ant  (txpbx), in situ- left top shin (txpbx)   SVD (spontaneous vaginal delivery)    x 1   Thyroid disease     Past Surgical  History:  Procedure Laterality Date   ABDOMINO-VAGINAL VESICAL NECK SUSPENSION  10/2018   BUNIONECTOMY     Bil/ hammer toe on right foot   COLONOSCOPY     COLONOSCOPY WITH PROPOFOL N/A 06/10/2012   Procedure: COLONOSCOPY WITH PROPOFOL;  Surgeon: Garlan Fair, MD;  Location: [facility];  Service: Endoscopy;  Laterality: N/A;   COLONOSCOPY WITH PROPOFOL N/A 09/22/2013   Procedure: COLONOSCOPY WITH PROPOFOL;  Surgeon: Garlan Fair, MD;  Location: [facility];  Service: Endoscopy;  Laterality: N/A;   COLONOSCOPY WITH PROPOFOL N/A 08/02/2015   Procedure: COLONOSCOPY WITH PROPOFOL;  Surgeon: Garlan Fair, MD;  Location: [facility];  Service: Endoscopy;  Laterality: N/A;   ESOPHAGOGASTRODUODENOSCOPY (EGD) WITH PROPOFOL N/A 06/10/2012   Procedure: ESOPHAGOGASTRODUODENOSCOPY (EGD) WITH PROPOFOL;  Surgeon: Garlan Fair, MD;  Location: [facility];  Service: Endoscopy;  Laterality: N/A;   ESOPHAGOGASTRODUODENOSCOPY (EGD) WITH PROPOFOL N/A 09/22/2013   Procedure: ESOPHAGOGASTRODUODENOSCOPY (EGD) WITH PROPOFOL;  Surgeon: Garlan Fair, MD;  Location: [facility];  Service: Endoscopy;  Laterality: N/A;   FOOT NEUROMA SURGERY Right    interstem  12/2018   MENISCUS REPAIR Bilateral    for meniscus tear, x2 left knee  NASAL SINUS SURGERY  1993   OVARIAN CYST REMOVAL     Laparotomy   PARTIAL COLECTOMY  04/12/2010   POLYPECTOMY     port a cath insertion  2012   for chemo use   PORT-A-CATH REMOVAL  05/03/2011   RETINAL TEAR REPAIR CRYOTHERAPY Right    TOOTH EXTRACTION     molars   TOTAL ABDOMINAL HYSTERECTOMY W/ BILATERAL SALPINGOOPHORECTOMY  2013   UPPER GASTROINTESTINAL ENDOSCOPY     VARICOSE VEIN SURGERY Bilateral     Prior to Admission medications   Medication Sig Start Date End Date Taking? Authorizing Provider  buPROPion (WELLBUTRIN XL) 300 MG 24 hr tablet Take 300 mg by mouth daily before breakfast.   Yes [provider]  calcium carbonate (OS-CAL) 1250  (500 Ca) MG chewable tablet Chew 1 tablet by mouth daily as needed.    Yes [provider]  Cholecalciferol (VITAMIN D) 2000 UNITS tablet Take 2,000 Units by mouth 2 (two) times a day.    Yes [provider]  Coenzyme Q10 (CO Q 10) 100 MG CAPS Take 100 mg by mouth daily.   Yes [provider]  Cyanocobalamin (VITAMIN B-12) 2500 MCG SUBL Place 2,500 mcg under the tongue daily.    Yes [provider]  doxycycline (VIBRA-TABS) 100 MG tablet Take 100 mg by mouth daily. 08/25/20  Yes [provider]  DULoxetine (CYMBALTA) 60 MG capsule Take 1 capsule by mouth every morning. 07/12/14  Yes [provider]  Glucosamine-Chondroit-Vit C-Mn (GLUCOSAMINE CHONDR 1500 COMPLX PO) Take 1 capsule by mouth 2 (two) times daily.    Yes [provider]  ketoconazole (NIZORAL) 2 % cream APPLY TO AFFECTED AREA(S) OF THE SKIN TWO TIMES A DAY 07/24/21  Yes Sheffield, Ronalee Red, PA-C  levothyroxine (SYNTHROID) 100 MCG tablet Take 100 mcg by mouth every other day. With 88 mcg on opposing days 06/18/15  Yes [provider]  levothyroxine (SYNTHROID) 88 MCG tablet Take 88 mcg by mouth every morning. 09/07/19  Yes [provider]  Magnesium 200 MG TABS Take 400 mg by mouth daily.    Yes [provider]  modafinil (PROVIGIL) 100 MG tablet Take 100 mg by mouth in the morning and at bedtime. 2 tablet (200 mg) in the morning and 1 tablet 100 mg at afternoon   Yes [provider]  Multiple Vitamin (MULTIVITAMIN WITH MINERALS) TABS Take 1 tablet by mouth daily.   Yes [provider]  ondansetron (ZOFRAN) 4 MG tablet Take 1 tablet (4 mg total) by mouth as directed. 1 table 30-60 minutes prior to each prep dose. 12/21/20  Yes Trellis Guirguis, Lajuan Lines, MD  ondansetron [facility]) 4 MG tablet Per colonoscopy instructions 11/27/21  Yes Ryelan Kazee, Lajuan Lines, MD  Polyethyl Glycol-Propyl Glycol 0.4-0.3 % SOLN Apply 1 drop to eye 3 (three) times daily as needed (dry  eyes).   Yes [provider]  Probiotic Product (VISBIOME HIGH POTENCY) CAPS Take 1 tablet by mouth daily. 12/03/17  Yes [provider]  Turmeric 500 MG CAPS 1031m   Yes [provider]  alclomethasone (ACLOVATE) 0.05 % cream Apply once to twice a day Patient not taking: Reported on 11/27/2021 12/22/20   SWarren Danes PA-C  Ascorbic Acid (VITAMIN C) 1000 MG tablet Take 1,000 mg by mouth 2 (two) times daily.    [provider]  ASTAXANTHIN PO Take 6 mg by mouth daily.    [provider]  augmented betamethasone dipropionate (DIPROLENE-AF) 0.05 % cream  Apply topically daily. Patient not taking: Reported on 11/27/2021 12/29/19   Lavonna Monarch, MD  Bromfenac Sodium (PROLENSA) 0.07 % SOLN PLACE ONE DROP INTO OPERATIVE EYE ONCE DAILY, BEGINNING ONE DAY PRIOR TO SURGERY Patient not taking: Reported on 11/27/2021 02/17/20   [provider]  cetirizine (ZYRTEC) 10 MG tablet Take 1 tablet (10 mg total) by mouth 2 (two) times daily. Patient not taking: Reported on 11/27/2021 08/16/20   Jiles Prows, MD  fluconazole (DIFLUCAN) 150 MG tablet TAKE 1 TABLET BY MOUTH ONCE A WEEK IN THE MONTHS OF JUNE, [place] AND AUGUST Patient taking differently: TAKE 1 TABLET BY MOUTH ONCE A WEEK IN THE MONTHS OF [place], [place]- takes 1 tablet monthly per pt 03/21/21   Kozlow, Donnamarie Poag, MD  fluconazole (DIFLUCAN) 200 MG tablet Take one tablet every 3 days 03/22/21   Warren Danes, PA-C  Fluocinolone Acetonide Scalp 0.01 % OIL Apply 1 application topically at bedtime. Patient not taking: Reported on 11/27/2021 05/10/21   [name] R, PA-C  fluocinonide (LIDEX) 0.05 % external solution Apply topically. Patient not taking: Reported on 11/27/2021 08/15/20   [provider]  fluticasone (FLONASE) 50 MCG/ACT nasal spray Place 1 spray into both nostrils daily. Patient not taking: Reported on 11/27/2021    [provider]  HYDROcodone-acetaminophen  (NORCO/VICODIN) 5-325 MG tablet Take by mouth. Patient not taking: Reported on 11/27/2021 08/23/20   [provider]  hydrocortisone 2.5 % cream Apply topically. Patient not taking: Reported on 11/27/2021 02/27/21   [provider]  ibuprofen (ADVIL,MOTRIN) 200 MG tablet Take 200 mg by mouth every 6 (six) hours as needed for moderate pain.    [provider]  ketoconazole (NIZORAL) 2 % shampoo APPLY TO THE SCALP AND LET IT SET FOR 3-5 MINUTES AND RINSE OUT ONCE DAILY 07/24/21   Sheffield, Vida Roller R, PA-C  Levomefolate Glucosamine (METHYLFOLATE) 400 MCG CAPS 1023mg Patient not taking: Reported on 11/27/2021    [provider]  Nattokinase 100 MG CAPS 20089mPatient not taking: Reported on 11/27/2021    [provider]  tacrolimus (PROTOPIC) 0.1 % ointment Apply 1 application topically at bedtime. Applies for dermatitis.    [provider]    Current Outpatient Medications  Medication Sig Dispense Refill   buPROPion (WELLBUTRIN XL) 300 MG 24 hr tablet Take 300 mg by mouth daily before breakfast.     calcium carbonate (OS-CAL) 1250 (500 Ca) MG chewable tablet Chew 1 tablet by mouth daily as needed.      Cholecalciferol (VITAMIN D) 2000 UNITS tablet Take 2,000 Units by mouth 2 (two) times a day.      Coenzyme Q10 (CO Q 10) 100 MG CAPS Take 100 mg by mouth daily.     Cyanocobalamin (VITAMIN B-12) 2500 MCG SUBL Place 2,500 mcg under the tongue daily.      doxycycline (VIBRA-TABS) 100 MG tablet Take 100 mg by mouth daily.     DULoxetine (CYMBALTA) 60 MG capsule Take 1 capsule by mouth every morning.     Glucosamine-Chondroit-Vit C-Mn (GLUCOSAMINE CHONDR 1500 COMPLX PO) Take 1 capsule by mouth 2 (two) times daily.      ketoconazole (NIZORAL) 2 % cream APPLY TO AFFECTED AREA(S) OF THE SKIN TWO TIMES A DAY 30 g 5   levothyroxine (SYNTHROID) 100 MCG tablet Take 100 mcg by mouth every other day. With 88 mcg on opposing days     levothyroxine (SYNTHROID) 88  MCG tablet Take 88 mcg by mouth every  morning.     Magnesium 200 MG TABS Take 400 mg by mouth daily.      modafinil (PROVIGIL) 100 MG tablet Take 100 mg by mouth in the morning and at bedtime. 2 tablet (200 mg) in the morning and 1 tablet 100 mg at afternoon     Multiple Vitamin (MULTIVITAMIN WITH MINERALS) TABS Take 1 tablet by mouth daily.     ondansetron (ZOFRAN) 4 MG tablet Take 1 tablet (4 mg total) by mouth as directed. 1 table 30-60 minutes prior to each prep dose. 2 tablet 0   ondansetron (ZOFRAN) 4 MG tablet Per colonoscopy instructions 2 tablet 0   Polyethyl Glycol-Propyl Glycol 0.4-0.3 % SOLN Apply 1 drop to eye 3 (three) times daily as needed (dry eyes).     Probiotic Product (VISBIOME HIGH POTENCY) CAPS Take 1 tablet by mouth daily.     Turmeric 500 MG CAPS 1024m     alclomethasone (ACLOVATE) 0.05 % cream Apply once to twice a day (Patient not taking: Reported on 11/27/2021) 45 g 1   Ascorbic Acid (VITAMIN C) 1000 MG tablet Take 1,000 mg by mouth 2 (two) times daily.     ASTAXANTHIN PO Take 6 mg by mouth daily.     augmented betamethasone dipropionate (DIPROLENE-AF) 0.05 % cream Apply topically daily. (Patient not taking: Reported on 11/27/2021) 50 g 1   Bromfenac Sodium (PROLENSA) 0.07 % SOLN PLACE ONE DROP INTO OPERATIVE EYE ONCE DAILY, BEGINNING ONE DAY PRIOR TO SURGERY (Patient not taking: Reported on 11/27/2021)     cetirizine (ZYRTEC) 10 MG tablet Take 1 tablet (10 mg total) by mouth 2 (two) times daily. (Patient not taking: Reported on 11/27/2021) 60 tablet 5   fluconazole (DIFLUCAN) 150 MG tablet TAKE 1 TABLET BY MOUTH ONCE A WEEK IN THE MONTHS OF JUNE, JULY AND AUGUST (Patient taking differently: TAKE 1 TABLET BY MOUTH ONCE A WEEK IN THE MONTHS OF JUNE, JULY AND AUGUST- takes 1 tablet monthly per pt) 12 tablet 1   fluconazole (DIFLUCAN) 200 MG tablet Take one tablet every 3 days 12 tablet 3   Fluocinolone Acetonide Scalp 0.01 % OIL Apply 1 application topically at bedtime.  (Patient not taking: Reported on 11/27/2021) 118.28 mL 11   fluocinonide (LIDEX) 0.05 % external solution Apply topically. (Patient not taking: Reported on 11/27/2021)     fluticasone (FLONASE) 50 MCG/ACT nasal spray Place 1 spray into both nostrils daily. (Patient not taking: Reported on 11/27/2021)     HYDROcodone-acetaminophen (NORCO/VICODIN) 5-325 MG tablet Take by mouth. (Patient not taking: Reported on 11/27/2021)     hydrocortisone 2.5 % cream Apply topically. (Patient not taking: Reported on 11/27/2021)     ibuprofen (ADVIL,MOTRIN) 200 MG tablet Take 200 mg by mouth every 6 (six) hours as needed for moderate pain.     ketoconazole (NIZORAL) 2 % shampoo APPLY TO THE SCALP AND LET IT SET FOR 3-5 MINUTES AND RINSE OUT ONCE DAILY 120 mL 2   Levomefolate Glucosamine (METHYLFOLATE) 400 MCG CAPS 10065m (Patient not taking: Reported on 11/27/2021)     Nattokinase 100 MG CAPS 200024mPatient not taking: Reported on 11/27/2021)     tacrolimus (PROTOPIC) 0.1 % ointment Apply 1 application topically at bedtime. Applies for dermatitis.     Current Facility-Administered Medications  Medication Dose Route Frequency Provider Last Rate Last Admin   0.9 %  sodium chloride infusion  500 mL Intravenous Continuous Aranda Bihm, JayLajuan LinesD        Allergies as of 12/18/2021 -  Review Complete 12/18/2021  Allergen Reaction Noted   Atorvastatin  04/18/2020   Estrogens  11/27/2021   Lactose  04/18/2020   Other  04/18/2020   Pitavastatin  04/18/2020   Simvastatin  04/18/2020    Family History  Problem Relation Age of Onset   Heart disease Mother    Heart attack Mother    Heart disease Father    Heart attack Father    Heart disease Brother    Heart attack Brother    Other Brother        Field seismologist syndrome-PMS2   Cancer - Other Brother    Colon polyps Brother    Other Brother        Lynch syndrome-PMS2   Sleep apnea Brother    Heart disease Maternal Grandmother    Heart attack Maternal Grandmother    Heart  disease Maternal Grandfather    Heart attack Maternal Grandfather    Alzheimer's disease Paternal Grandmother    Emphysema Paternal Grandfather    Other Son        lynch syndrome-PMS2   Colon polyps Son    Esophageal cancer Neg Hx    Stomach cancer Neg Hx    Rectal cancer Neg Hx    Colon cancer Neg Hx     Social History   Socioeconomic History   Marital status: Married    Spouse name: Not on file   Number of children: 1   Years of education: Not on file   Highest education level: Not on file  Occupational History   Occupation: retired Pharmacist, hospital  Tobacco Use   Smoking status: Never   Smokeless tobacco: Never   Tobacco comments:    high school years 1973 - tried  to smoke and did not take or like it   Scientific laboratory technician Use: Never used  Substance and Sexual Activity   Alcohol use: No   Drug use: No   Sexual activity: Yes    Birth control/protection: Post-menopausal  Other Topics Concern   Not on file  Social History Narrative   Not on file   Social Determinants of Health   Financial Resource Strain: Not on file  Food Insecurity: Not on file  Transportation Needs: Not on file  Physical Activity: Not on file  Stress: Not on file  Social Connections: Not on file  Intimate Partner Violence: Not on file    Physical Exam: Vital signs in last 24 hours: @BP  124/84   Pulse 72   Temp (!) 97.1 F (36.2 C)   Resp 13   Ht 5' 6.5" (1.689 m)   Wt 147 lb (66.7 kg)   SpO2 100%   BMI 23.37 kg/m  GEN: NAD EYE: Sclerae anicteric ENT: MMM CV: Non-tachycardic Pulm: CTA b/l GI: Soft, NT/ND NEURO:  Alert & Oriented x 3   Zenovia Jarred, MD [facility]  12/18/2021 1:31 PM

## 2021-12-19 ENCOUNTER — Telehealth: Payer: Self-pay

## 2021-12-19 ENCOUNTER — Ambulatory Visit (INDEPENDENT_AMBULATORY_CARE_PROVIDER_SITE_OTHER): Payer: Medicare Other | Admitting: Internal Medicine

## 2021-12-19 DIAGNOSIS — Z8509 Personal history of malignant neoplasm of other digestive organs: Secondary | ICD-10-CM

## 2021-12-19 DIAGNOSIS — Z1509 Genetic susceptibility to other malignant neoplasm: Secondary | ICD-10-CM | POA: Diagnosis not present

## 2021-12-19 NOTE — Telephone Encounter (Signed)
  Follow up Call-     12/18/2021   12:51 PM 01/04/2021   10:34 AM 10/05/2019   12:47 PM  Call back number  Post procedure Call Back phone  # [phone] hm [phone]  Permission to leave phone message Yes Yes Yes     Patient questions:  Do you have a fever, pain , or abdominal swelling? No. Pain Score  0 *  Have you tolerated food without any problems? No.  Have you been able to return to your normal activities? No.  Do you have any questions about your discharge instructions: Diet   No. Medications  No. Follow up visit  No.  Do you have questions or concerns about your Care? No.  Actions: * If pain score is 4 or above: No action needed, pain <4.  Pt. Coming in later today for VCE as scheduled.

## 2021-12-19 NOTE — Progress Notes (Signed)
CAPSULE ID: [id number] Exp: 2023-03-17 LOT: [id number]  Patient arrived for capsule endoscopy. Reported the prep went well. Confirmed patient is fasting. Explained dietary restrictions for the next few hours. Patient verbalized understanding. Opened capsule, ensured capsule was flashing and transmitting to the recorder prior to the patient swallowing the capsule. Patient swallowed capsule without difficulty. Patient told to call the office with any questions. Understands to return to the office today between 4:00 and 4:30 pm. No further questions by the conclusion of the visit.

## 2021-12-19 NOTE — Patient Instructions (Signed)
  The capsule endoscopy procedure will last approximately 8 hours. Contact your doctor's office immediately if you suffer from any abdominal pain, nausea or vomiting during the procedure. 1. You may drink colorless liquids starting 2 hours after swallowing the capsule.  2. You may have a light snack  4 hours after ingestion. After the examination is completed you may return to your normal diet.  3. Check the blue flashing DataRecorder light a couple of times through the day. If is stops blinking or changes color, note the time and contact your doctor.  4. Avoid strong electromagnetic fields such as MRI devices or ham radios after swallowing the capsule and until you pass it in a bowel movement.  5. Do not disconnect the equipment or completely remove the DataRecorder at any time during the procedure.  6. Treat the DataRecorder carefully. Avoid sudden movements and banging the DataRecorder.  7. Avoid direct exposure to bright sunlight.  8. Return to the office at 4:00 pm to have the recording equipment removed.  Clear Liquid Diet Examples: Black Coffee (non-dairy creamer ok) Jell-O (NO fruit added & NO red Jell-o) Water Bouillon (Chicken or Beef) 7-up Cranberry Juice Apple Juice Popsicles (NO red) Tea Coke Sprite Pepsi Ginger Ale Gatorade Mt Dew Dr Pepper Light Snack Examples: Soup Cereal 1/2 Sandwich Salad Eggs Potatoes Toast Rice  

## 2021-12-26 ENCOUNTER — Other Ambulatory Visit: Payer: Self-pay

## 2021-12-26 DIAGNOSIS — T184XXA Foreign body in colon, initial encounter: Secondary | ICD-10-CM

## 2021-12-28 ENCOUNTER — Encounter: Payer: Self-pay | Admitting: Internal Medicine

## 2021-12-28 ENCOUNTER — Ambulatory Visit (INDEPENDENT_AMBULATORY_CARE_PROVIDER_SITE_OTHER)
Admission: RE | Admit: 2021-12-28 | Discharge: 2021-12-28 | Disposition: A | Discharge status: Home / Self Care | Payer: Medicare Other | Source: Ambulatory Visit | Attending: Internal Medicine | Admitting: Internal Medicine

## 2021-12-28 DIAGNOSIS — T184XXA Foreign body in colon, initial encounter: Secondary | ICD-10-CM | POA: Diagnosis not present

## 2022-01-25 ENCOUNTER — Other Ambulatory Visit (HOSPITAL_BASED_OUTPATIENT_CLINIC_OR_DEPARTMENT_OTHER): Payer: Self-pay

## 2022-01-25 MED ORDER — INFLUENZA VAC A&B SA ADJ QUAD 0.5 ML IM PRSY
PREFILLED_SYRINGE | INTRAMUSCULAR | 0 refills | Status: DC
  Filled 2022-01-25: qty 0.5, 1d supply, fill #0

## 2022-05-09 ENCOUNTER — Ambulatory Visit: Payer: Medicare Other | Admitting: Cardiology

## 2022-05-09 ENCOUNTER — Ambulatory Visit: Payer: Medicare Other | Admitting: Internal Medicine

## 2022-05-09 ENCOUNTER — Encounter: Payer: Self-pay | Admitting: Internal Medicine

## 2022-05-09 VITALS — BP 136/69 | HR 82 | Ht 66.5 in | Wt 154.4 lb

## 2022-05-09 DIAGNOSIS — I201 Angina pectoris with documented spasm: Secondary | ICD-10-CM

## 2022-05-09 DIAGNOSIS — Z1509 Genetic susceptibility to other malignant neoplasm: Secondary | ICD-10-CM

## 2022-05-09 DIAGNOSIS — G319 Degenerative disease of nervous system, unspecified: Secondary | ICD-10-CM

## 2022-05-09 DIAGNOSIS — E782 Mixed hyperlipidemia: Secondary | ICD-10-CM

## 2022-05-09 MED ORDER — NEXLETOL 180 MG PO TABS: 1.0000 | ORAL_TABLET | Freq: Every day | ORAL | 6 refills | Status: DC

## 2022-05-09 NOTE — Progress Notes (Signed)
Primary Physician/Referring:  [name], MD  Patient ID: Angela Hart, female    DOB: 03-18-1950, 72 y.o.   MRN: [id number]  Chief Complaint  Patient presents with   Cerebrovascular disease   Follow-up   HPI:    Angela Hart  is a 72 y.o. female with Lynch syndrome underwent prophylactic hysterectomy- for cancer (Lynch Syndrome: Colon cancer and Uterus and  less likely other organs). She has had partial colectomy for colon cancer. Also has printzemal angina diagnosed in 2014, hyperlipidemia, and family history of early CAD.   Patient has not been to our office for a few years now and she decided to follow back up due to concerning findings on her MRI at [facility].  Patient is concerned that she has cerebrovascular disease, however, per the MRI there is no history of vascular disease or stroke.  Of note she does have hippocampal atrophy which is greater than expected for her age consistent with the start of dementia.  Patient denies any symptoms like forgetfulness or memory loss or word finding difficulty.  She is going to follow-up at the memory center at [facility].  She does not have a neurologist and she would like a referral to 1 here in [place].  Patient denies chest pain, shortness of breath, palpitations, diaphoresis, syncope, edema, orthopnea, PND.  Past Medical History:  Diagnosis Date   Abdominal pain    Allergy    Anemia    Anxiety    Arthritis    hands/hip, gout left ankle   Atypical nevus 10/04/2003   slight-mod-left post axilla   Blood transfusion 03/2010   at [facility]   Cataract    Colon cancer ([place]) 03/2010   Concussion    per pt, has had 2-3 concussions due to horse injuries   Depression    Fatigue    GERD (gastroesophageal reflux disease)    occasional   Heart murmur    hx -no problems   Hyperlipidemia    Interstitial cystitis    Lactose intolerance    Lynch syndrome 12/22/2011   Major depression    clinical- sees leslie brown   MVP (mitral valve prolapse)     mild-seen on echo 2008   Neuromuscular disorder ([place])    raynaud's disease hands/feet   Neuropathy    Obstructive sleep apnea    very mild   Osteoarthritis    Osteoporosis    Pelvic fracture (HCC)    while riding a horse in 2008   Peripheral neuropathy    mild- due to chemotherapy   Prinzmetal angina (HCC)    Raynaud's syndrome    hands/feet   Serrated adenoma of colon    Squamous cell carcinoma of skin 10/27/2014   KA- right inner shin,ant  (txpbx), in situ- left top shin (txpbx)   SVD (spontaneous vaginal delivery)    x 1   Thyroid disease    Past Surgical History:  Procedure Laterality Date   ABDOMINO-VAGINAL VESICAL NECK SUSPENSION  10/2018   BUNIONECTOMY     Bil/ hammer toe on right foot   COLONOSCOPY     COLONOSCOPY WITH PROPOFOL N/A 06/10/2012   Procedure: COLONOSCOPY WITH PROPOFOL;  Surgeon: Garlan Fair, MD;  Location: [facility];  Service: Endoscopy;  Laterality: N/A;   COLONOSCOPY WITH PROPOFOL N/A 09/22/2013   Procedure: COLONOSCOPY WITH PROPOFOL;  Surgeon: Garlan Fair, MD;  Location: [facility];  Service: Endoscopy;  Laterality: N/A;   COLONOSCOPY WITH PROPOFOL N/A 08/02/2015   Procedure: COLONOSCOPY  WITH PROPOFOL;  Surgeon: Garlan Fair, MD;  Location: [facility];  Service: Endoscopy;  Laterality: N/A;   ESOPHAGOGASTRODUODENOSCOPY (EGD) WITH PROPOFOL N/A 06/10/2012   Procedure: ESOPHAGOGASTRODUODENOSCOPY (EGD) WITH PROPOFOL;  Surgeon: Garlan Fair, MD;  Location: [facility];  Service: Endoscopy;  Laterality: N/A;   ESOPHAGOGASTRODUODENOSCOPY (EGD) WITH PROPOFOL N/A 09/22/2013   Procedure: ESOPHAGOGASTRODUODENOSCOPY (EGD) WITH PROPOFOL;  Surgeon: Garlan Fair, MD;  Location: [facility];  Service: Endoscopy;  Laterality: N/A;   FOOT NEUROMA SURGERY Right    interstem  12/2018   MENISCUS REPAIR Bilateral    for meniscus tear, x2 left knee   NASAL SINUS SURGERY  1993   OVARIAN CYST REMOVAL     Laparotomy   PARTIAL COLECTOMY   04/12/2010   POLYPECTOMY     port a cath insertion  2012   for chemo use   PORT-A-CATH REMOVAL  05/03/2011   RETINAL TEAR REPAIR CRYOTHERAPY Right    TOOTH EXTRACTION     molars   TOTAL ABDOMINAL HYSTERECTOMY W/ BILATERAL SALPINGOOPHORECTOMY  2013   UPPER GASTROINTESTINAL ENDOSCOPY     VARICOSE VEIN SURGERY Bilateral    Family History  Problem Relation Age of Onset   Heart disease Mother    Heart attack Mother    Heart disease Father    Heart attack Father    Heart disease Brother    Heart attack Brother    Other Brother        Field seismologist syndrome-PMS2   Cancer - Other Brother    Colon polyps Brother    Other Brother        Lynch syndrome-PMS2   Sleep apnea Brother    Heart disease Maternal Grandmother    Heart attack Maternal Grandmother    Heart disease Maternal Grandfather    Heart attack Maternal Grandfather    Alzheimer's disease Paternal Grandmother    Emphysema Paternal Grandfather    Other Son        lynch syndrome-PMS2   Colon polyps Son    Esophageal cancer Neg Hx    Stomach cancer Neg Hx    Rectal cancer Neg Hx    Colon cancer Neg Hx     Social History   Tobacco Use   Smoking status: Never   Smokeless tobacco: Never   Tobacco comments:    high school years 1973 - tried  to smoke and did not take or like it   Substance Use Topics   Alcohol use: No   Marital Status: Married  ROS  Review of Systems  Cardiovascular:  Negative for chest pain, claudication, dyspnea on exertion, irregular heartbeat, leg swelling, near-syncope, orthopnea, palpitations, paroxysmal nocturnal dyspnea and syncope.  Neurological:  Positive for headaches.   Objective  Blood pressure 136/69, pulse 82, height 5' 6.5" (1.689 m), weight 154 lb 6.4 oz (70 kg), SpO2 98 %. Body mass index is 24.55 kg/m.     05/09/2022    9:28 AM 12/18/2021    2:33 PM 12/18/2021    2:23 PM  Vitals with BMI  Height 5' 6.5"    Weight 154 lbs 6 oz    BMI [id number]    Systolic [id number]  Diastolic 69 64  66  Pulse 82 60 61     Physical Exam Vitals reviewed.  HENT:     Head: Normocephalic and atraumatic.  Cardiovascular:     Rate and Rhythm: Normal rate and regular rhythm.     Pulses: Normal pulses.  Heart sounds: Normal heart sounds. No murmur heard. Pulmonary:     Effort: Pulmonary effort is normal.     Breath sounds: Normal breath sounds.  Abdominal:     General: Bowel sounds are normal.  Musculoskeletal:     Right lower leg: No edema.     Left lower leg: No edema.  Skin:    General: Skin is warm and dry.  Neurological:     Mental Status: She is alert.     Medications and allergies   Allergies  Allergen Reactions   Atorvastatin     Other reaction(s): myalgias   Estrogens     Vaginal estrogen- "flu like symptoms", headaches   Lactose     Other reaction(s): bloated Other reaction(s): bloated   Other     Other reaction(s): rash Plastics- fash   Pitavastatin     Other reaction(s): myalgias   Simvastatin     Other reaction(s): myalgias     Medication list after today's encounter   Current Outpatient Medications:    alclomethasone (ACLOVATE) 0.05 % cream, Apply once to twice a day, Disp: 45 g, Rfl: 1   Ascorbic Acid (VITAMIN C) 1000 MG tablet, Take 1,000 mg by mouth 2 (two) times daily., Disp: , Rfl:    ASTAXANTHIN PO, Take 6 mg by mouth daily., Disp: , Rfl:    augmented betamethasone dipropionate (DIPROLENE-AF) 0.05 % cream, Apply topically daily., Disp: 50 g, Rfl: 1   Bempedoic Acid (NEXLETOL) 180 MG TABS, Take 1 tablet (180 mg total) by mouth daily., Disp: 30 tablet, Rfl: 6   Bromfenac Sodium (PROLENSA) 0.07 % SOLN, , Disp: , Rfl:    buPROPion (WELLBUTRIN XL) 300 MG 24 hr tablet, Take 300 mg by mouth daily before breakfast., Disp: , Rfl:    calcium carbonate (OS-CAL) 1250 (500 Ca) MG chewable tablet, Chew 1 tablet by mouth daily as needed. , Disp: , Rfl:    cetirizine (ZYRTEC) 10 MG tablet, Take 1 tablet (10 mg total) by mouth 2 (two) times daily., Disp: 60  tablet, Rfl: 5   Cholecalciferol (VITAMIN D) 2000 UNITS tablet, Take 2,000 Units by mouth 2 (two) times a day. , Disp: , Rfl:    Coenzyme Q10 (CO Q 10) 100 MG CAPS, Take 100 mg by mouth daily., Disp: , Rfl:    doxycycline (VIBRAMYCIN) 100 MG capsule, Take 100 mg by mouth daily., Disp: , Rfl:    DULoxetine (CYMBALTA) 30 MG capsule, Take 30 mg by mouth daily., Disp: , Rfl:    DULoxetine (CYMBALTA) 60 MG capsule, Take 1 capsule by mouth every morning., Disp: , Rfl:    fluocinonide (LIDEX) 0.05 % external solution, Apply topically., Disp: , Rfl:    Glucosamine-Chondroit-Vit C-Mn (GLUCOSAMINE CHONDR 1500 COMPLX PO), Take 1 capsule by mouth 2 (two) times daily. , Disp: , Rfl:    hydrocortisone 2.5 % cream, Apply topically., Disp: , Rfl:    ibuprofen (ADVIL,MOTRIN) 200 MG tablet, Take 200 mg by mouth every 6 (six) hours as needed for moderate pain., Disp: , Rfl:    influenza vaccine adjuvanted (FLUAD) 0.5 ML injection, Inject into the muscle., Disp: 0.5 mL, Rfl: 0   ketoconazole (NIZORAL) 2 % cream, APPLY TO AFFECTED AREA(S) OF THE SKIN TWO TIMES A DAY, Disp: 30 g, Rfl: 5   ketoconazole (NIZORAL) 2 % shampoo, APPLY TO THE SCALP AND LET IT SET FOR 3-5 MINUTES AND RINSE OUT ONCE DAILY, Disp: 120 mL, Rfl: 2   levothyroxine (SYNTHROID) 100 MCG tablet, Take 100  mcg by mouth every other day. With 88 mcg on opposing days, Disp: , Rfl:    levothyroxine (SYNTHROID) 88 MCG tablet, Take 88 mcg by mouth every morning., Disp: , Rfl:    Magnesium 200 MG TABS, Take 400 mg by mouth daily. , Disp: , Rfl:    modafinil (PROVIGIL) 100 MG tablet, Take 100 mg by mouth in the morning and at bedtime. 2 tablet (200 mg) in the morning and 1 tablet 100 mg at afternoon, Disp: , Rfl:    Multiple Vitamin (MULTIVITAMIN WITH MINERALS) TABS, Take 1 tablet by mouth daily., Disp: , Rfl:    ondansetron (ZOFRAN) 4 MG tablet, Take 1 tablet (4 mg total) by mouth as directed. 1 table 30-60 minutes prior to each prep dose., Disp: 2 tablet, Rfl:  0   Polyethyl Glycol-Propyl Glycol 0.4-0.3 % SOLN, Apply 1 drop to eye 3 (three) times daily as needed (dry eyes)., Disp: , Rfl:    Probiotic Product (VISBIOME HIGH POTENCY) CAPS, Take 1 tablet by mouth daily., Disp: , Rfl:    Turmeric 500 MG CAPS, '1000mg'$ , Disp: , Rfl:   Laboratory examination:   Lab Results  Component Value Date   NA 138 12/22/2011   K 3.7 12/22/2011   CO2 28 12/22/2011   GLUCOSE 88 12/22/2011   BUN 8 12/22/2011   CREATININE 0.67 12/22/2011   CALCIUM 8.9 12/22/2011   GFRNONAA >90 12/22/2011       Latest Ref Rng & Units 12/22/2011    5:34 AM 12/12/2011   12:16 PM 05/15/2010   12:24 PM  CMP  Glucose 70 - 99 mg/dL 88  96  82   BUN 6 - 23 mg/dL '8  13  10   '$ Creatinine 0.50 - 1.10 mg/dL 0.67  0.72  0.71   Sodium 135 - 145 mEq/L 138  138  138   Potassium 3.5 - 5.1 mEq/L 3.7  3.8  4.4   Chloride 96 - 112 mEq/L 101  99  101   CO2 19 - 32 mEq/L 28  32  29   Calcium 8.4 - 10.5 mg/dL 8.9  9.6  9.6   Total Protein 6.0 - 8.3 g/dL 5.4  7.0    Total Bilirubin 0.3 - 1.2 mg/dL 0.3  0.2    Alkaline Phos 39 - 117 U/L 67  82    AST 0 - 37 U/L 20  30    ALT 0 - 35 U/L 17  28        Latest Ref Rng & Units 12/22/2011    5:34 AM 12/12/2011   12:16 PM 05/15/2010   12:24 PM  CBC  WBC 4.0 - 10.5 K/uL 7.7  5.1  5.1   Hemoglobin 12.0 - 15.0 g/dL 10.9  12.2  10.7   Hematocrit 36.0 - 46.0 % 32.6  37.4  34.4   Platelets 150 - 400 K/uL 276  318  384     Lipid Panel No results for input(s): "CHOL", "TRIG", "LDLCALC", "VLDL", "HDL", "CHOLHDL", "LDLDIRECT" in the last 8760 hours.  HEMOGLOBIN A1C No results found for: "HGBA1C", "MPG" TSH No results for input(s): "TSH" in the last 8760 hours.  External labs:   Labs 04/02/2022: TSH 0.98 Free T4 0.99 Iron 61 TIBC 454 transferrin 13 Vitamin b12 = 1,173  Paraneoplastic Auto Ab Eval NEGATIVE  Radiology:   04/10/2022 MRI BRAIN IMPRESSION:  1. Bilateral hippocampal atrophy on quantitative assessment. Hippocampi at  the 1st  percentile for age-matched controls.  2. No evidence  of intracranial metastatic disease.    Cardiac Studies:   Echocardiogram 05/30/2018:  Left ventricle cavity is normal in size. Normal global wall motion.  Doppler evidence of grade I (impaired) diastolic dysfunction, normal LAP.  Calculated EF 55%.  Mild (Grade I) mitral regurgitation.  No evidence of pulmonary hypertension.    Exercise Sestamibi Stress Test 07/14/2018 1. The resting electrocardiogram demonstrated normal sinus rhythm, normal resting conduction and normal rest repolarization.  The stress electrocardiogram was positive for ischemia with 2 mm horizontal to up sloping ST depression at peak exercise persisting for >3 min into recovery. No arrhythmias. Normal BP response. Patient exercised on Bruce protocol for 8:29 minutes and achieved 10.16 METS. Stress test terminated due to dyspnea and 88% MPHR achieved (Target HR >85%).  2. Myocardial perfusion imaging is normal. Overall left ventricular systolic function was normal without regional wall motion abnormalities. The left ventricular ejection fraction was 73%.   3.  This is a low risk study, clinical correlation recommended   09/29/2020 CACS FINDINGS: Left Main: 0 LAD: 0 LCx: 0 RCA: 0 Total Agatston Score: 0 MESA database percentile: 0 AORTA MEASUREMENTS: Ascending Aorta: 36 mm Descending Aorta: 23 mm   EKG:   05/09/2022: Sinus Rhythm, rate 84 bpm. Normal axis, normal R wave progression. No ischemia. No change compared to prior  EKG 03/25/2020: Normal sinus rhythm at rate of 74 bpm, normal axis.  No evidence of ischemia, normal EKG. no significant change from prior EKG, minimal inferior ST depression no longer present.  Assessment     ICD-10-CM   1. Prinzmetal angina (HCC)  I20.1 EKG 12-Lead    Ambulatory referral to Neurology    Lipid Panel With LDL/HDL Ratio    2. Lynch syndrome  Z15.09 Ambulatory referral to Neurology    Lipid Panel With LDL/HDL Ratio     3. Mixed hyperlipidemia  E78.2 Ambulatory referral to Neurology    Lipid Panel With LDL/HDL Ratio    4. Brain atrophy ([place])  G31.9 Ambulatory referral to Neurology    Lipid Panel With LDL/HDL Ratio       Orders Placed This Encounter  Procedures   Lipid Panel With LDL/HDL Ratio   Ambulatory referral to Neurology    Referral Priority:   Routine    Referral Type:   Consultation    Referral Reason:   Specialty Services Required    Referred to Provider:   Alda Berthold, DO    Requested Specialty:   Neurology    Number of Visits Requested:   1   EKG 12-Lead    Meds ordered this encounter  Medications   Bempedoic Acid (NEXLETOL) 180 MG TABS    Sig: Take 1 tablet (180 mg total) by mouth daily.    Dispense:  30 tablet    Refill:  6    Medications Discontinued During This Encounter  Medication Reason   Cyanocobalamin (VITAMIN B-12) 2500 MCG SUBL Completed Course   doxycycline (VIBRA-TABS) 100 MG tablet Completed Course   Fluocinolone Acetonide Scalp 0.01 % OIL Completed Course   fluconazole (DIFLUCAN) 150 MG tablet Completed Course   fluconazole (DIFLUCAN) 200 MG tablet Completed Course   fluticasone (FLONASE) 50 MCG/ACT nasal spray Completed Course   HYDROcodone-acetaminophen (NORCO/VICODIN) 5-325 MG tablet Completed Course   Levomefolate Glucosamine (METHYLFOLATE) 400 MCG CAPS Completed Course   Nattokinase 100 MG CAPS Completed Course   tacrolimus (PROTOPIC) 0.1 % ointment Completed Course   ondansetron (ZOFRAN) 4 MG tablet Completed Course     Recommendations:  Angela Hart is a 72 y.o.  female with Prinzmetal angina and hippocampal atrophy  Prinzmetal angina (HCC) CACS in 2022 was 0 Will initiate Nexletol for HLD  Lynch syndrome Stable. Follows with heme/onc No mets per MRI brain   Mixed hyperlipidemia Will start Nexletol as she has not tolerated any statin Repeat lipids prior to next visit Follow-up in 3 months or sooner if needed   Brain atrophy  ([place]) Bilateral hippocampal atrophy on quantitative assessment. Hippocampi at the 1st percentile for age-matched controls per MRI on 04/10/2022 Will refer to neurology. Patient is established with memory clinic at [facility].     Floydene Flock, DO, [facility]  05/09/2022, 11:09 AM Office: [phone] Pager: [phone]

## 2022-05-17 ENCOUNTER — Other Ambulatory Visit: Payer: Self-pay

## 2022-05-17 MED ORDER — NEXLETOL 180 MG PO TABS: 1.0000 | ORAL_TABLET | Freq: Every day | ORAL | 3 refills | Status: DC

## 2022-08-07 ENCOUNTER — Ambulatory Visit: Payer: Medicare Other | Admitting: Internal Medicine

## 2022-08-07 ENCOUNTER — Other Ambulatory Visit: Payer: Self-pay

## 2022-08-07 ENCOUNTER — Encounter: Payer: Self-pay | Admitting: Internal Medicine

## 2022-08-07 VITALS — BP 132/73 | HR 82 | Ht 66.5 in | Wt 150.6 lb

## 2022-08-07 DIAGNOSIS — G319 Degenerative disease of nervous system, unspecified: Secondary | ICD-10-CM

## 2022-08-07 DIAGNOSIS — E782 Mixed hyperlipidemia: Secondary | ICD-10-CM

## 2022-08-07 DIAGNOSIS — Z789 Other specified health status: Secondary | ICD-10-CM

## 2022-08-07 MED ORDER — REPATHA SURECLICK 140 MG/ML ~~LOC~~ SOAJ: 1.0000 mL | SUBCUTANEOUS | 3 refills | Status: DC

## 2022-08-07 NOTE — Progress Notes (Signed)
Primary Physician/Referring:  Angela Ates, MD  Patient ID: Angela Hart, female    DOB: 07-23-1950, 72 y.o.   MRN: [id number]  Chief Complaint  Patient presents with   Chest Pain   Follow-up   HPI:    Angela Hart  is a 72 y.o. female with Lynch syndrome underwent prophylactic hysterectomy- for cancer (Lynch Syndrome: Colon cancer and Uterus and  less likely other organs). She has had partial colectomy for colon cancer. Also has printzemal angina diagnosed in 2014, hyperlipidemia, and family history of early CAD.   Patient is here for a follow-up visit. She is going to the memory clinic soon. She is still waiting to establish with neurology here in the area. She complains of brain fog and memory loss. From a cardiac standpoint she is doing well. She has not tolerated any statit or nexletol/nexlizet. Patient denies chest pain, shortness of breath, palpitations, diaphoresis, syncope, edema, orthopnea, PND.  Past Medical History:  Diagnosis Date   Abdominal pain    Allergy    Anemia    Anxiety    Arthritis    hands/hip, gout left ankle   Atypical nevus 10/04/2003   slight-mod-left post axilla   Blood transfusion 03/2010   at [facility]   Cataract    Colon cancer (HCC) 03/2010   Concussion    per pt, has had 2-3 concussions due to horse injuries   Depression    Fatigue    GERD (gastroesophageal reflux disease)    occasional   Heart murmur    hx -no problems   Hyperlipidemia    Interstitial cystitis    Lactose intolerance    Lynch syndrome 12/22/2011   Major depression    clinical- sees leslie brown   MVP (mitral valve prolapse)    mild-seen on echo 2008   Neuromuscular disorder (HCC)    raynaud's disease hands/feet   Neuropathy    Obstructive sleep apnea    very mild   Osteoarthritis    Osteoporosis    Pelvic fracture (HCC)    while riding a horse in 2008   Peripheral neuropathy    mild- due to chemotherapy   Prinzmetal angina (HCC)    Raynaud's syndrome     hands/feet   Serrated adenoma of colon    Squamous cell carcinoma of skin 10/27/2014   KA- right inner shin,ant  (txpbx), in situ- left top shin (txpbx)   SVD (spontaneous vaginal delivery)    x 1   Thyroid disease    Past Surgical History:  Procedure Laterality Date   ABDOMINO-VAGINAL VESICAL NECK SUSPENSION  10/2018   BUNIONECTOMY     Bil/ hammer toe on right foot   COLONOSCOPY     COLONOSCOPY WITH PROPOFOL N/A 06/10/2012   Procedure: COLONOSCOPY WITH PROPOFOL;  Surgeon: Charolett Bumpers, MD;  Location: [facility];  Service: Endoscopy;  Laterality: N/A;   COLONOSCOPY WITH PROPOFOL N/A 09/22/2013   Procedure: COLONOSCOPY WITH PROPOFOL;  Surgeon: Charolett Bumpers, MD;  Location: [facility];  Service: Endoscopy;  Laterality: N/A;   COLONOSCOPY WITH PROPOFOL N/A 08/02/2015   Procedure: COLONOSCOPY WITH PROPOFOL;  Surgeon: Charolett Bumpers, MD;  Location: [facility];  Service: Endoscopy;  Laterality: N/A;   ESOPHAGOGASTRODUODENOSCOPY (EGD) WITH PROPOFOL N/A 06/10/2012   Procedure: ESOPHAGOGASTRODUODENOSCOPY (EGD) WITH PROPOFOL;  Surgeon: Charolett Bumpers, MD;  Location: [facility];  Service: Endoscopy;  Laterality: N/A;   ESOPHAGOGASTRODUODENOSCOPY (EGD) WITH PROPOFOL N/A 09/22/2013   Procedure: ESOPHAGOGASTRODUODENOSCOPY (EGD) WITH PROPOFOL;  Surgeon: Charolett Bumpers, MD;  Location: [facility];  Service: Endoscopy;  Laterality: N/A;   FOOT NEUROMA SURGERY Right    interstem  12/2018   MENISCUS REPAIR Bilateral    for meniscus tear, x2 left knee   NASAL SINUS SURGERY  1993   OVARIAN CYST REMOVAL     Laparotomy   PARTIAL COLECTOMY  04/12/2010   POLYPECTOMY     port a cath insertion  2012   for chemo use   PORT-A-CATH REMOVAL  05/03/2011   RETINAL TEAR REPAIR CRYOTHERAPY Right    TOOTH EXTRACTION     molars   TOTAL ABDOMINAL HYSTERECTOMY W/ BILATERAL SALPINGOOPHORECTOMY  2013   UPPER GASTROINTESTINAL ENDOSCOPY     VARICOSE VEIN SURGERY Bilateral    Family History   Problem Relation Age of Onset   Heart disease Mother    Heart attack Mother    Heart disease Father    Heart attack Father    Heart disease Brother    Heart attack Brother    Other Brother        Personnel officer syndrome-PMS2   Cancer - Other Brother    Colon polyps Brother    Other Brother        Lynch syndrome-PMS2   Sleep apnea Brother    Heart disease Maternal Grandmother    Heart attack Maternal Grandmother    Heart disease Maternal Grandfather    Heart attack Maternal Grandfather    Alzheimer's disease Paternal Grandmother    Emphysema Paternal Grandfather    Other Son        lynch syndrome-PMS2   Colon polyps Son    Esophageal cancer Neg Hx    Stomach cancer Neg Hx    Rectal cancer Neg Hx    Colon cancer Neg Hx     Social History   Tobacco Use   Smoking status: Never   Smokeless tobacco: Never   Tobacco comments:    high school years 1973 - tried  to smoke and did not take or like it   Substance Use Topics   Alcohol use: No   Marital Status: Married  ROS  Review of Systems  Cardiovascular:  Negative for chest pain, claudication, dyspnea on exertion, irregular heartbeat, leg swelling, near-syncope, orthopnea, palpitations, paroxysmal nocturnal dyspnea and syncope.  Neurological:  Positive for headaches.  Psychiatric/Behavioral:  Positive for memory loss.    Objective  Blood pressure 132/73, pulse 82, height 5' 6.5" (1.689 m), weight 150 lb 9.6 oz (68.3 kg), SpO2 99 %. Body mass index is 23.94 kg/m.     08/07/2022   10:34 AM 05/09/2022    9:28 AM 12/18/2021    2:33 PM  Vitals with BMI  Height 5' 6.5" 5' 6.5"   Weight 150 lbs 10 oz 154 lbs 6 oz   BMI 23.95 24.55   Systolic 132 136 409  Diastolic 73 69 64  Pulse 82 82 60     Physical Exam Vitals reviewed.  HENT:     Head: Normocephalic and atraumatic.  Cardiovascular:     Rate and Rhythm: Normal rate and regular rhythm.     Pulses: Normal pulses.     Heart sounds: Normal heart sounds. No murmur  heard. Pulmonary:     Effort: Pulmonary effort is normal.     Breath sounds: Normal breath sounds.  Abdominal:     General: Bowel sounds are normal.  Musculoskeletal:     Right lower leg: No edema.     Left  lower leg: No edema.  Skin:    General: Skin is warm and dry.  Neurological:     Mental Status: She is alert.     Medications and allergies   Allergies  Allergen Reactions   Atorvastatin     Other reaction(s): myalgias   Estrogens     Vaginal estrogen- "flu like symptoms", headaches   Lactose     Other reaction(s): bloated Other reaction(s): bloated   Other     Other reaction(s): rash Plastics- fash   Pitavastatin     Other reaction(s): myalgias   Simvastatin     Other reaction(s): myalgias     Medication list after today's encounter   Current Outpatient Medications:    alclomethasone (ACLOVATE) 0.05 % cream, Apply once to twice a day, Disp: 45 g, Rfl: 1   Ascorbic Acid (VITAMIN C) 1000 MG tablet, Take 1,000 mg by mouth 2 (two) times daily., Disp: , Rfl:    ASTAXANTHIN PO, Take 6 mg by mouth daily., Disp: , Rfl:    augmented betamethasone dipropionate (DIPROLENE-AF) 0.05 % cream, Apply topically daily., Disp: 50 g, Rfl: 1   Bromfenac Sodium (PROLENSA) 0.07 % SOLN, , Disp: , Rfl:    buPROPion (WELLBUTRIN XL) 300 MG 24 hr tablet, Take 300 mg by mouth daily before breakfast., Disp: , Rfl:    calcium carbonate (OS-CAL) 1250 (500 Ca) MG chewable tablet, Chew 1 tablet by mouth daily as needed. , Disp: , Rfl:    cetirizine (ZYRTEC) 10 MG tablet, Take 1 tablet (10 mg total) by mouth 2 (two) times daily., Disp: 60 tablet, Rfl: 5   Cholecalciferol (VITAMIN D) 2000 UNITS tablet, Take 2,000 Units by mouth 2 (two) times a day. , Disp: , Rfl:    Coenzyme Q10 (CO Q 10) 100 MG CAPS, Take 100 mg by mouth daily., Disp: , Rfl:    doxycycline (VIBRAMYCIN) 100 MG capsule, Take 100 mg by mouth daily., Disp: , Rfl:    DULoxetine (CYMBALTA) 30 MG capsule, Take 30 mg by mouth daily.,  Disp: , Rfl:    DULoxetine (CYMBALTA) 60 MG capsule, Take 1 capsule by mouth every morning., Disp: , Rfl:    Evolocumab (REPATHA SURECLICK) 140 MG/ML SOAJ, Inject 140 mg into the skin every 14 (fourteen) days., Disp: 2 mL, Rfl: 3   fluocinonide (LIDEX) 0.05 % external solution, Apply topically., Disp: , Rfl:    Glucosamine-Chondroit-Vit C-Mn (GLUCOSAMINE CHONDR 1500 COMPLX PO), Take 1 capsule by mouth 2 (two) times daily. , Disp: , Rfl:    hydrocortisone 2.5 % cream, Apply topically., Disp: , Rfl:    ibuprofen (ADVIL,MOTRIN) 200 MG tablet, Take 200 mg by mouth every 6 (six) hours as needed for moderate pain., Disp: , Rfl:    ketoconazole (NIZORAL) 2 % cream, APPLY TO AFFECTED AREA(S) OF THE SKIN TWO TIMES A DAY, Disp: 30 g, Rfl: 5   ketoconazole (NIZORAL) 2 % shampoo, APPLY TO THE SCALP AND LET IT SET FOR 3-5 MINUTES AND RINSE OUT ONCE DAILY, Disp: 120 mL, Rfl: 2   levothyroxine (SYNTHROID) 100 MCG tablet, Take 100 mcg by mouth every other day. With 88 mcg on opposing days, Disp: , Rfl:    levothyroxine (SYNTHROID) 88 MCG tablet, Take 88 mcg by mouth every morning., Disp: , Rfl:    Magnesium 200 MG TABS, Take 400 mg by mouth daily. , Disp: , Rfl:    modafinil (PROVIGIL) 100 MG tablet, Take 200 mg by mouth daily. 2 tablet (200 mg) in the morning  and 1 tablet 100 mg at afternoon, Disp: , Rfl:    Multiple Vitamin (MULTIVITAMIN WITH MINERALS) TABS, Take 1 tablet by mouth daily., Disp: , Rfl:    ondansetron (ZOFRAN) 4 MG tablet, Take 1 tablet (4 mg total) by mouth as directed. 1 table 30-60 minutes prior to each prep dose., Disp: 2 tablet, Rfl: 0   Polyethyl Glycol-Propyl Glycol 0.4-0.3 % SOLN, Apply 1 drop to eye 3 (three) times daily as needed (dry eyes)., Disp: , Rfl:    Probiotic Product (VISBIOME HIGH POTENCY) CAPS, Take 1 tablet by mouth daily., Disp: , Rfl:    Turmeric 500 MG CAPS, 1000mg , Disp: , Rfl:    influenza vaccine adjuvanted (FLUAD) 0.5 ML injection, Inject into the muscle., Disp: 0.5  mL, Rfl: 0  Laboratory examination:   Lab Results  Component Value Date   NA 138 12/22/2011   K 3.7 12/22/2011   CO2 28 12/22/2011   GLUCOSE 88 12/22/2011   BUN 8 12/22/2011   CREATININE 0.67 12/22/2011   CALCIUM 8.9 12/22/2011   GFRNONAA >90 12/22/2011       Latest Ref Rng & Units 12/22/2011    5:34 AM 12/12/2011   12:16 PM 05/15/2010   12:24 PM  CMP  Glucose 70 - 99 mg/dL 88  96  82   BUN 6 - 23 mg/dL 8  13  10    Creatinine 0.50 - 1.10 mg/dL 8.65  7.84  6.96   Sodium 135 - 145 mEq/L 138  138  138   Potassium 3.5 - 5.1 mEq/L 3.7  3.8  4.4   Chloride 96 - 112 mEq/L 101  99  101   CO2 19 - 32 mEq/L 28  32  29   Calcium 8.4 - 10.5 mg/dL 8.9  9.6  9.6   Total Protein 6.0 - 8.3 g/dL 5.4  7.0    Total Bilirubin 0.3 - 1.2 mg/dL 0.3  0.2    Alkaline Phos 39 - 117 U/L 67  82    AST 0 - 37 U/L 20  30    ALT 0 - 35 U/L 17  28        Latest Ref Rng & Units 12/22/2011    5:34 AM 12/12/2011   12:16 PM 05/15/2010   12:24 PM  CBC  WBC 4.0 - 10.5 K/uL 7.7  5.1  5.1   Hemoglobin 12.0 - 15.0 g/dL 29.5  28.4  13.2   Hematocrit 36.0 - 46.0 % 32.6  37.4  34.4   Platelets 150 - 400 K/uL 276  318  384     Lipid Panel No results for input(s): "CHOL", "TRIG", "LDLCALC", "VLDL", "HDL", "CHOLHDL", "LDLDIRECT" in the last 8760 hours.  HEMOGLOBIN A1C No results found for: "HGBA1C", "MPG" TSH No results for input(s): "TSH" in the last 8760 hours.  External labs:   Labs 04/02/2022: TSH 0.98 Free T4 0.99 Iron 61 TIBC 454 transferrin 13 Vitamin b12 = 1,173  Paraneoplastic Auto Ab Eval NEGATIVE  Radiology:   04/10/2022 MRI BRAIN IMPRESSION:  1. Bilateral hippocampal atrophy on quantitative assessment. Hippocampi at  the 1st percentile for age-matched controls.  2. No evidence of intracranial metastatic disease.    Cardiac Studies:   Echocardiogram 05/30/2018:  Left ventricle cavity is normal in size. Normal global wall motion.  Doppler evidence of grade I (impaired) diastolic  dysfunction, normal LAP.  Calculated EF 55%.  Mild (Grade I) mitral regurgitation.  No evidence of pulmonary hypertension.    Exercise Sestamibi Stress  Test 07/14/2018 1. The resting electrocardiogram demonstrated normal sinus rhythm, normal resting conduction and normal rest repolarization.  The stress electrocardiogram was positive for ischemia with 2 mm horizontal to up sloping ST depression at peak exercise persisting for >3 min into recovery. No arrhythmias. Normal BP response. Patient exercised on Bruce protocol for 8:29 minutes and achieved 10.16 METS. Stress test terminated due to dyspnea and 88% MPHR achieved (Target HR >85%).  2. Myocardial perfusion imaging is normal. Overall left ventricular systolic function was normal without regional wall motion abnormalities. The left ventricular ejection fraction was 73%.   3.  This is a low risk study, clinical correlation recommended   09/29/2020 CACS FINDINGS: Left Main: 0 LAD: 0 LCx: 0 RCA: 0 Total Agatston Score: 0 MESA database percentile: 0 AORTA MEASUREMENTS: Ascending Aorta: 36 mm Descending Aorta: 23 mm   EKG:   05/09/2022: Sinus Rhythm, rate 84 bpm. Normal axis, normal R wave progression. No ischemia. No change compared to prior  EKG 03/25/2020: Normal sinus rhythm at rate of 74 bpm, normal axis.  No evidence of ischemia, normal EKG. no significant change from prior EKG, minimal inferior ST depression no longer present.  Assessment     ICD-10-CM   1. Mixed hyperlipidemia  E78.2     2. Statin intolerance  Z78.9     3. Brain atrophy (HCC)  G31.9        No orders of the defined types were placed in this encounter.   Meds ordered this encounter  Medications   Evolocumab (REPATHA SURECLICK) 140 MG/ML SOAJ    Sig: Inject 140 mg into the skin every 14 (fourteen) days.    Dispense:  2 mL    Refill:  3    Medications Discontinued During This Encounter  Medication Reason   Bempedoic Acid (NEXLETOL) 180 MG TABS Cost  of medication     Recommendations:   PENELOPE FRANTZ is a 72 y.o.  female with HLD and statin intolerance    Mixed hyperlipidemia Statin intolerance Did not tolerate Nexletol/Nexlizet and she has not tolerated any statin Will start patient on Repatha Follow-up in 6 months or sooner if needed   Brain atrophy (HCC) Bilateral hippocampal atrophy on quantitative assessment. Hippocampi at the 1st percentile for age-matched controls per MRI on 04/10/2022 I have given her neuro referral at out last visit Patient is established with memory clinic at [facility].     Clotilde Dieter, DO, [facility]  08/07/2022, 2:24 PM Office: [phone] Pager: [phone]

## 2022-08-09 ENCOUNTER — Other Ambulatory Visit: Payer: Self-pay

## 2022-08-09 ENCOUNTER — Telehealth: Payer: Self-pay

## 2022-08-09 MED ORDER — PRALUENT 150 MG/ML ~~LOC~~ SOAJ: 150.0000 mg | SUBCUTANEOUS | 3 refills | Status: DC

## 2022-08-09 NOTE — Telephone Encounter (Signed)
Repatha was denied, insurance prefers praluent , can it be switched ?

## 2022-08-09 NOTE — Telephone Encounter (Signed)
Sent!

## 2022-10-16 ENCOUNTER — Telehealth: Payer: Self-pay | Admitting: Internal Medicine

## 2022-10-16 NOTE — Telephone Encounter (Signed)
Report faxed as requested

## 2022-10-16 NOTE — Telephone Encounter (Signed)
Records faxed as requested.

## 2022-10-16 NOTE — Telephone Encounter (Signed)
Inbound call from [place] with the [facility] office. requesting procedure report from 10/9 be faxed over to his attn at [phone]. Please advise.   Thank you

## 2022-10-16 NOTE — Telephone Encounter (Signed)
Please fax patient capsule endo repot over to [phone] [facility]. Thank you

## 2022-12-06 ENCOUNTER — Other Ambulatory Visit: Payer: Self-pay | Admitting: Physician Assistant

## 2022-12-06 DIAGNOSIS — R42 Dizziness and giddiness: Secondary | ICD-10-CM

## 2022-12-06 DIAGNOSIS — I6789 Other cerebrovascular disease: Secondary | ICD-10-CM

## 2022-12-11 ENCOUNTER — Other Ambulatory Visit: Payer: Medicare Other

## 2022-12-14 ENCOUNTER — Ambulatory Visit
Admission: RE | Admit: 2022-12-14 | Discharge: 2022-12-14 | Disposition: A | Discharge status: Home / Self Care | Payer: Medicare Other | Source: Ambulatory Visit | Attending: Physician Assistant | Admitting: Physician Assistant

## 2022-12-14 DIAGNOSIS — I6789 Other cerebrovascular disease: Secondary | ICD-10-CM

## 2022-12-14 DIAGNOSIS — R42 Dizziness and giddiness: Secondary | ICD-10-CM

## 2022-12-14 MED ORDER — IOPAMIDOL (ISOVUE-370) INJECTION 76%
75.0000 mL | Freq: Once | INTRAVENOUS | Status: AC | PRN
  Administered 2022-12-14: 75 mL via INTRAVENOUS

## 2023-01-22 ENCOUNTER — Encounter: Payer: Self-pay | Admitting: Internal Medicine

## 2023-01-28 ENCOUNTER — Encounter: Payer: Self-pay | Admitting: Cardiology

## 2023-01-28 ENCOUNTER — Ambulatory Visit: Payer: Medicare Other | Attending: Cardiology | Admitting: Cardiology

## 2023-01-28 VITALS — BP 120/68 | HR 84 | Resp 16 | Ht 66.0 in | Wt 150.0 lb

## 2023-01-28 DIAGNOSIS — E7800 Pure hypercholesterolemia, unspecified: Secondary | ICD-10-CM | POA: Diagnosis present

## 2023-01-28 DIAGNOSIS — Z789 Other specified health status: Secondary | ICD-10-CM | POA: Insufficient documentation

## 2023-01-28 DIAGNOSIS — I201 Angina pectoris with documented spasm: Secondary | ICD-10-CM | POA: Diagnosis not present

## 2023-01-28 DIAGNOSIS — Z87898 Personal history of other specified conditions: Secondary | ICD-10-CM | POA: Insufficient documentation

## 2023-01-28 NOTE — Patient Instructions (Signed)
Medication Instructions:  Your physician recommends that you continue on your current medications as directed. Please refer to the Current Medication list given to you today.  *If you need a refill on your cardiac medications before your next appointment, please call your pharmacy*   Lab Work: none If you have labs (blood work) drawn today and your tests are completely normal, you will receive your results only by: MyChart Message (if you have MyChart) OR A paper copy in the mail If you have any lab test that is abnormal or we need to change your treatment, we will call you to review the results.   Testing/Procedures: none   Follow-Up: At [facility], you and your health needs are our priority.  As part of our continuing mission to provide you with exceptional heart care, we have created designated Provider Care Teams.  These Care Teams include your primary Cardiologist (physician) and Advanced Practice Providers (APPs -  Physician Assistants and Nurse Practitioners) who all work together to provide you with the care you need, when you need it.  We recommend signing up for the patient portal called "MyChart".  Sign up information is provided on this After Visit Summary.  MyChart is used to connect with patients for Virtual Visits (Telemedicine).  Patients are able to view lab/test results, encounter notes, upcoming appointments, etc.  Non-urgent messages can be sent to your provider as well.   To learn more about what you can do with MyChart, go to [email or web address].    Your next appointment:   As needed  Provider:   Yates Decamp, MD     Other Instructions

## 2023-01-28 NOTE — Progress Notes (Signed)
Cardiology Office Note:  .   Date:  01/28/2023  ID:  [name], DOB 1951-01-15, MRN [id number] PCP: Thana Ates, MD  [facility] Providers Cardiologist:  Yates Decamp, MD    History of Present Illness: .   Angela Hart is a 72 y.o. female with Lynch syndrome with partial colectomy for colon cancer also underwent prophylactic hysterectomy- for cancer (Lynch Syndrome: Colon cancer and Uterus and  less likely other organs being followed at [facility]), printzemal angina diagnosed in 2014, hyperlipidemia, and family history of early CAD.  Fortunately her coronary calcium score in 2022 was 0. She is statin intolerant and could not also tolerate Nexletol.   She has chronic hyponatremia.  Discussed the use of AI scribe software for clinical note transcription with the patient, who gave verbal consent to proceed.  History of Present Illness   Angela Hart, a patient with a history of idiopathic hypersomnia and lung cancer, presents with worsening dizziness and fogginess. These symptoms have been ongoing and have progressively worsened over time. The patient describes the dizziness as a "wooziness" and notes that it becomes more pronounced when she is not focused on a specific task. She also reports experiencing headaches, which have been constant and occasionally severe.  In addition to these symptoms, the patient has noticed issues with her eyesight, describing it as a sort of blurriness that affects her clarity of vision. Despite these visual disturbances, she is still able to read, study, and drive without issue as long as she remains focused. However, she has noticed difficulties with walking due to the dizziness.  The patient has sought multiple opinions for her symptoms, including an MRI and a visit to a memory center due to concerns about brain atrophy. However, the neurologist reported that everything was fine. She has also consulted an audiologist and an eye doctor due to her  symptoms. 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       Review of Systems  Cardiovascular:  Negative for chest pain, dyspnea on exertion and leg swelling.  Neurological:  Positive for difficulty with concentration, dizziness, headaches and weakness.    Risk Assessment/Calculations:              External Labs: Labs 10/16/2022:  Hb 12.5/HCT 37.3, platelets 350.  Normal indicis.  Sodium 130, potassium 4.3, BUN 21, creatinine 0.6, EGFR 97 mL, LFTs normal. Labs 09/18/2022:  TSH normal at 0.70.  Vitamin D47.3.  Total cholesterol 240, triglycerides 194, HDL 91, LDL 117.  Non-HDL cholesterol 150.  Physical Exam:   VS:  BP 120/68 (BP Location: Right Arm, Patient Position: Sitting, Cuff Size: Normal)   Pulse 84   Resp 16   Ht 5\' 6"  (1.676 m)   Wt 150 lb (68 kg)   SpO2 95%   BMI 24.21 kg/m    Wt Readings from Last 3 Encounters:  01/28/23 150 lb (68 kg)  08/07/22 150 lb 9.6 oz (68.3 kg)  05/09/22 154 lb 6.4 oz (70 kg)    Physical Exam Neck:     Vascular: No carotid bruit or JVD.  Cardiovascular:     Rate and Rhythm: Normal rate and regular rhythm.     Pulses: Intact distal pulses.     Heart sounds: Normal heart sounds. No murmur heard.    No gallop.  Pulmonary:     Effort: Pulmonary effort is normal.     Breath sounds:  Normal breath sounds.  Abdominal:     General: Bowel sounds are normal.     Palpations: Abdomen is soft.  Musculoskeletal:     Right lower leg: No edema.     Left lower leg: No edema.     Studies Reviewed: .    Echocardiogram 05/30/2018:  Left ventricle cavity is normal in size. Normal global wall motion.  Doppler evidence of grade I (impaired) diastolic dysfunction, normal LAP.  Calculated EF 55%.  Mild (Grade I) mitral regurgitation.  No evidence of pulmonary hypertension.      Exercise Sestamibi Stress Test 07/14/2018 1. The resting electrocardiogram demonstrated  normal sinus rhythm, normal resting conduction and normal rest repolarization.  The stress electrocardiogram was positive for ischemia with 2 mm horizontal to up sloping ST depression at peak exercise persisting for >3 min into recovery. No arrhythmias. Normal BP response. Patient exercised on Bruce protocol for 8:29 minutes and achieved 10.16 METS. Stress test terminated due to dyspnea and 88% MPHR achieved (Target HR >85%).  2. Myocardial perfusion imaging is normal. Overall left ventricular systolic function was normal without regional wall motion abnormalities. The left ventricular ejection fraction was 73%.   3.  This is a low risk study, clinical correlation recommended   09/29/2020 CACS FINDINGS: Total Agatston Score: 0. MESA database percentile: 0 AORTA MEASUREMENTS: Ascending Aorta: 36 mm Descending Aorta: 23 mm  CTA head 12/14/2022: 1. No large vessel occlusion or significant proximal stenosis. 2. 3 mm distal basilar artery aneurysm. 3. No evidence of acute intracranial abnormality.  EKG:    EKG Interpretation Date/Time:  Monday January 28 2023 11:16:12 EST Ventricular Rate:  87 PR Interval:  142 QRS Duration:  84 QT Interval:  368 QTC Calculation: 442 R Axis:   54  Text Interpretation: EKG 01/28/2023: Normal sinus rhythm at rate of 87 bpm, left atrial enlargement, normal axis.  No evidence of ischemia.  No significant change from 01/02/2021. Confirmed by Delrae Rend [phone]) on 01/28/2023 11:48:37 AM    Current Meds  Medication Sig   alclomethasone (ACLOVATE) 0.05 % cream Apply once to twice a day   Ascorbic Acid (VITAMIN C) 1000 MG tablet Take 1,000 mg by mouth 2 (two) times daily.   ASTAXANTHIN PO Take 6 mg by mouth daily.   augmented betamethasone dipropionate (DIPROLENE-AF) 0.05 % cream Apply topically daily.   Bromfenac Sodium (PROLENSA) 0.07 % SOLN    calcium carbonate (OS-CAL) 1250 (500 Ca) MG chewable tablet Chew 1 tablet by mouth daily as needed.     Cholecalciferol (VITAMIN D) 2000 UNITS tablet Take 2,000 Units by mouth 2 (two) times a day.    Coenzyme Q10 (CO Q 10) 100 MG CAPS Take 100 mg by mouth daily.   doxycycline (VIBRAMYCIN) 100 MG capsule Take 100 mg by mouth daily.   DULoxetine (CYMBALTA) 60 MG capsule Take 1 capsule by mouth every morning.   Glucosamine-Chondroit-Vit C-Mn (GLUCOSAMINE CHONDR 1500 COMPLX PO) Take 1 capsule by mouth 2 (two) times daily.    hydroxychloroquine (PLAQUENIL) 200 MG tablet Take 100 mg by mouth daily.   ibuprofen (ADVIL,MOTRIN) 200 MG tablet Take 200 mg by mouth every 6 (six) hours as needed for moderate pain.   ketoconazole (NIZORAL) 2 % shampoo APPLY TO THE SCALP AND LET IT SET FOR 3-5 MINUTES AND RINSE OUT ONCE DAILY   levothyroxine (SYNTHROID) 100 MCG tablet Take 100 mcg by mouth every other day. With 88 mcg on opposing days   levothyroxine (SYNTHROID) 88 MCG tablet Take 88  mcg by mouth every morning.   Magnesium 200 MG TABS Take 400 mg by mouth daily.    minoxidil (LONITEN) 2.5 MG tablet Take 2.5 mg by mouth daily.   modafinil (PROVIGIL) 100 MG tablet Take 200 mg by mouth daily. 2 tablet (200 mg) in the morning and 1 tablet 100 mg at afternoon   Multiple Vitamin (MULTIVITAMIN WITH MINERALS) TABS Take 1 tablet by mouth daily.   ondansetron (ZOFRAN) 4 MG tablet Take 1 tablet (4 mg total) by mouth as directed. 1 table 30-60 minutes prior to each prep dose.   Polyethyl Glycol-Propyl Glycol 0.4-0.3 % SOLN Apply 1 drop to eye 3 (three) times daily as needed (dry eyes).   Probiotic Product (VISBIOME HIGH POTENCY) CAPS Take 1 tablet by mouth daily.   Turmeric 500 MG CAPS 1000mg      ASSESSMENT AND PLAN: .      ICD-10-CM   1. Prinzmetal angina (HCC)  I20.1 EKG 12-Lead    2. Mild hypercholesterolemia  E78.00     3. Statin intolerance  Z78.9     4. History of dizziness  Z87.898       Assessment and Plan    Cerebral Symptoms Persistent dizziness, fogginess, and headaches. MRI showed cerebral atrophy  but no significant changes over six months. CT scan revealed a small (3mm) basilar artery aneurysm. Symptoms do not clearly correlate with the aneurysm. -Continue watchful waiting and symptom monitoring.  Prinzmetal Angina Stable with rare episodes. No changes in symptoms or severity. -No changes to current management.  Hyperlipidemia Slightly elevated cholesterol, but high HDL. Patient unable to tolerate statins. CT angiogram, coronary calcium score, and other scans do not show plaque buildup. -No need for cholesterol medication at this time.  Idiopathic Hypersomnia Previously diagnosed. No current plan discussed.  Skin Issues Taking hydroxychloroquine (Plaquenil) for scalp inflammation and hair loss. -Continue current treatment.  Follow-up No specific follow-up plan discussed.     As she is stable from cardiac standpoint, I will see her back on a as needed basis.  Signed,  Yates Decamp, MD, [facility] 01/28/2023, 5:54 PM [facility] [address] [place], [place] [address] Phone: [phone]. Fax:  [phone]

## 2023-03-25 ENCOUNTER — Ambulatory Visit (AMBULATORY_SURGERY_CENTER): Payer: Medicare Other

## 2023-03-25 VITALS — Ht 66.0 in | Wt 147.0 lb

## 2023-03-25 DIAGNOSIS — Z1509 Genetic susceptibility to other malignant neoplasm: Secondary | ICD-10-CM

## 2023-03-25 DIAGNOSIS — Z85038 Personal history of other malignant neoplasm of large intestine: Secondary | ICD-10-CM

## 2023-03-25 DIAGNOSIS — Z860100 Personal history of colon polyps, unspecified: Secondary | ICD-10-CM

## 2023-03-25 MED ORDER — ONDANSETRON HCL 4 MG PO TABS: 4.0000 mg | ORAL_TABLET | ORAL | 0 refills | Status: AC

## 2023-03-25 NOTE — Progress Notes (Signed)
 No egg or soy allergy known to patient  No issues known to pt with past sedation with any surgeries or procedures Patient denies ever being told they had issues or difficulty with intubation  No FH of Malignant Hyperthermia Pt is not on diet pills Pt is not on  home 02  Pt is not on blood thinners  Pt denies issues with constipation  No A fib or A flutter Have any cardiac testing pending-- no LOA: independent  Prep: plenvu  unable to tolerate other preps   Patient's chart reviewed by Norleen Schillings CNRA prior to previsit and patient appropriate for the [facility].  Previsit completed and red dot placed by patient's name on their procedure day (on provider's schedule).     PV competed with patient. Prep instructions sent via mychart. Pt to pick up prep instructions and plenvu  sample from 2nd floor lobby on 03/28/23

## 2023-04-08 ENCOUNTER — Ambulatory Visit (AMBULATORY_SURGERY_CENTER): Payer: Medicare Other | Admitting: Internal Medicine

## 2023-04-08 ENCOUNTER — Encounter: Payer: Self-pay | Admitting: Internal Medicine

## 2023-04-08 VITALS — BP 125/83 | HR 64 | Temp 97.3°F | Resp 15 | Ht 66.0 in | Wt 147.0 lb

## 2023-04-08 DIAGNOSIS — Z1509 Genetic susceptibility to other malignant neoplasm: Secondary | ICD-10-CM | POA: Diagnosis not present

## 2023-04-08 DIAGNOSIS — D123 Benign neoplasm of transverse colon: Secondary | ICD-10-CM | POA: Diagnosis not present

## 2023-04-08 DIAGNOSIS — K5730 Diverticulosis of large intestine without perforation or abscess without bleeding: Secondary | ICD-10-CM

## 2023-04-08 DIAGNOSIS — K317 Polyp of stomach and duodenum: Secondary | ICD-10-CM

## 2023-04-08 DIAGNOSIS — Z9889 Other specified postprocedural states: Secondary | ICD-10-CM

## 2023-04-08 DIAGNOSIS — Z1211 Encounter for screening for malignant neoplasm of colon: Secondary | ICD-10-CM

## 2023-04-08 DIAGNOSIS — Z85038 Personal history of other malignant neoplasm of large intestine: Secondary | ICD-10-CM

## 2023-04-08 DIAGNOSIS — Z860100 Personal history of colon polyps, unspecified: Secondary | ICD-10-CM

## 2023-04-08 MED ORDER — SODIUM CHLORIDE 0.9 % IV SOLN: 500.0000 mL | Freq: Once | INTRAVENOUS | Status: DC

## 2023-04-08 NOTE — Op Note (Signed)
[facility] Patient Name: Angela Hart Procedure Date: 04/08/2023 1:31 PM MRN: [id number] Endoscopist: Beverley Fiedler , MD, [id number] Age: 73 Referring MD:  Date of Birth: [birth year] Gender: Female Account #: [id number] Procedure:                Upper GI endoscopy Indications:              Surveillance for malignancy secondary to Lynch                            Syndrome Medicines:                Monitored Anesthesia Care Procedure:                Pre-Anesthesia Assessment:                           - Prior to the procedure, a History and Physical                            was performed, and patient medications and                            allergies were reviewed. The patient's tolerance of                            previous anesthesia was also reviewed. The risks                            and benefits of the procedure and the sedation                            options and risks were discussed with the patient.                            All questions were answered, and informed consent                            was obtained. Prior Anticoagulants: The patient has                            taken no anticoagulant or antiplatelet agents. ASA                            Grade Assessment: II - A patient with mild systemic                            disease. After reviewing the risks and benefits,                            the patient was deemed in satisfactory condition to                            undergo the procedure.  After obtaining informed consent, the endoscope was                            passed under direct vision. Throughout the                            procedure, the patient's blood pressure, pulse, and                            oxygen saturations were monitored continuously. The                            GIF [id number] was introduced through the                            mouth, and advanced to the second part of duodenum.                             The upper GI endoscopy was accomplished without                            difficulty. The patient tolerated the procedure                            well. Scope In: Scope Out: Findings:                 The examined esophagus was normal.                           Multiple small sessile polyps with no bleeding were                            found in the gastric fundus and in the gastric body.                           The exam of the stomach was otherwise normal.                           The examined duodenum was normal. Complications:            No immediate complications. Estimated Blood Loss:     Estimated blood loss: none. Impression:               - Normal esophagus.                           - Multiple gastric polyps. Benign and with typical                            appearance of fundic gland polyps. Previously                            biopsied and without dysplasia or H. Pylori.                           -  Normal examined duodenum.                           - No specimens collected. Recommendation:           - Patient has a contact number available for                            emergencies. The signs and symptoms of potential                            delayed complications were discussed with the                            patient. Return to normal activities tomorrow.                            Written discharge instructions were provided to the                            patient.                           - Resume previous diet.                           - Continue present medications.                           - Repeat upper endoscopy in 3 years for screening                            purposes. Beverley Fiedler, MD 04/08/2023 2:07:28 PM This report has been signed electronically.

## 2023-04-08 NOTE — Progress Notes (Signed)
Pt resting comfortably. VSS. Airway intact. SBAR complete to RN. All questions answered.

## 2023-04-08 NOTE — Op Note (Signed)
[facility] Patient Name: Angela Hart Procedure Date: 04/08/2023 1:31 PM MRN: [id number] Endoscopist: Beverley Fiedler , MD, [id number] Age: 73 Referring MD:  Date of Birth: 1950/06/26 Gender: Female Account #: [id number] Procedure:                Colonoscopy Indications:              High risk colon cancer surveillance: Personal                            history of colon cancer, Last colonoscopy: October                            2023 (TA x 2), Lynch Syndrome PMS2 mutation Medicines:                Monitored Anesthesia Care Procedure:                Pre-Anesthesia Assessment:                           - Prior to the procedure, a History and Physical                            was performed, and patient medications and                            allergies were reviewed. The patient's tolerance of                            previous anesthesia was also reviewed. The risks                            and benefits of the procedure and the sedation                            options and risks were discussed with the patient.                            All questions were answered, and informed consent                            was obtained. Prior Anticoagulants: The patient has                            taken no anticoagulant or antiplatelet agents. ASA                            Grade Assessment: II - A patient with mild systemic                            disease. After reviewing the risks and benefits,                            the patient was deemed in satisfactory condition to  undergo the procedure.                           After obtaining informed consent, the colonoscope                            was passed under direct vision. Throughout the                            procedure, the patient's blood pressure, pulse, and                            oxygen saturations were monitored continuously. The                            Olympus Scope SN:  [id number] was introduced through                            the anus and advanced to the cecum, identified by                            appendiceal orifice and ileocecal valve. The                            colonoscopy was performed without difficulty. The                            patient tolerated the procedure well. The quality                            of the bowel preparation was good. The ileocecal                            valve, appendiceal orifice, and rectum were                            photographed. Scope In: 1:44:33 PM Scope Out: 2:03:38 PM Scope Withdrawal Time: 0 hours 15 minutes 16 seconds  Total Procedure Duration: 0 hours 19 minutes 5 seconds  Findings:                 The digital rectal exam was normal.                           A 3 mm polyp was found in the transverse colon. The                            polyp was sessile. The polyp was removed with a                            cold snare. Resection and retrieval were complete.                           There was evidence of a prior end-to-side  colo-colonic anastomosis in the distal transverse                            colon. This was patent and was characterized by                            healthy appearing mucosa.                           Multiple medium-mouthed and small-mouthed                            diverticula were found in the sigmoid colon,                            descending colon, transverse colon and ascending                            colon.                           The retroflexed view of the distal rectum and anal                            verge was normal and showed no anal or rectal                            abnormalities. Complications:            No immediate complications. Estimated Blood Loss:     Estimated blood loss: none. Impression:               - One 3 mm polyp in the transverse colon, removed                            with a cold snare.  Resected and retrieved.                           - Patent end-to-side colo-colonic anastomosis,                            characterized by healthy appearing mucosa.                           - Moderate diverticulosis in the sigmoid colon, in                            the descending colon, in the transverse colon and                            in the ascending colon.                           - The distal rectum and anal verge are normal on  retroflexion view. Recommendation:           - Patient has a contact number available for                            emergencies. The signs and symptoms of potential                            delayed complications were discussed with the                            patient. Return to normal activities tomorrow.                            Written discharge instructions were provided to the                            patient.                           - Resume previous diet.                           - Continue present medications.                           - Await pathology results.                           - Repeat colonoscopy is recommended for                            surveillance (likely 2 years). The colonoscopy date                            will be determined after pathology results from                            today's exam become available for review. Beverley Fiedler, MD 04/08/2023 2:12:55 PM This report has been signed electronically.

## 2023-04-08 NOTE — Progress Notes (Signed)
Squamous cell carcinoma extraction on 04/03/2023  on bilateral legs per pt.

## 2023-04-08 NOTE — Progress Notes (Signed)
GASTROENTEROLOGY PROCEDURE H&P NOTE   Primary Care Physician: Thana Ates, MD    Reason for Procedure:  Lynch syndrome and personal history of colon cancer  Plan:    EGD and colonoscopy  Patient is appropriate for endoscopic procedure(s) in the ambulatory ([facility]) setting.  The nature of the procedure, as well as the risks, benefits, and alternatives were carefully and thoroughly reviewed with the patient. Ample time for discussion and questions allowed. The patient understood, was satisfied, and agreed to proceed.     HPI: Angela Hart is a 73 y.o. female who presents for EGD and colonoscopy.  Medical history as below.  Tolerated the prep.  No recent chest pain or shortness of breath.  No abdominal pain today.  Past Medical History:  Diagnosis Date   Abdominal pain    Allergy    Anemia    Anxiety    Arthritis    hands/hip, gout left ankle   Atypical nevus 10/04/2003   slight-mod-left post axilla   Blood transfusion 03/2010   at [facility]   Cataract    Colon cancer (HCC) 03/2010   Concussion    per pt, has had 2-3 concussions due to horse injuries   Depression    Fatigue    GERD (gastroesophageal reflux disease)    occasional   Heart murmur    hx -no problems   Hyperlipidemia    Interstitial cystitis    Lactose intolerance    Lynch syndrome 12/22/2011   Major depression    clinical- sees leslie brown   MVP (mitral valve prolapse)    mild-seen on echo 2008   Neuromuscular disorder (HCC)    raynaud's disease hands/feet   Neuropathy    Obstructive sleep apnea    very mild   Osteoarthritis    Osteoporosis    Pelvic fracture (HCC)    while riding a horse in 2008   Peripheral neuropathy    mild- due to chemotherapy   Prinzmetal angina (HCC)    Raynaud's syndrome    hands/feet   Serrated adenoma of colon    Squamous cell carcinoma of skin 10/27/2014   [facility]- right inner shin,ant  (txpbx), in situ- left top shin (txpbx)   SVD (spontaneous vaginal delivery)     x 1   Thyroid disease     Past Surgical History:  Procedure Laterality Date   ABDOMINO-VAGINAL VESICAL NECK SUSPENSION  10/2018   BUNIONECTOMY     Bil/ hammer toe on right foot   COLONOSCOPY     COLONOSCOPY WITH PROPOFOL N/A 06/10/2012   Procedure: COLONOSCOPY WITH PROPOFOL;  Surgeon: Charolett Bumpers, MD;  Location: [facility];  Service: Endoscopy;  Laterality: N/A;   COLONOSCOPY WITH PROPOFOL N/A 09/22/2013   Procedure: COLONOSCOPY WITH PROPOFOL;  Surgeon: Charolett Bumpers, MD;  Location: [facility];  Service: Endoscopy;  Laterality: N/A;   COLONOSCOPY WITH PROPOFOL N/A 08/02/2015   Procedure: COLONOSCOPY WITH PROPOFOL;  Surgeon: Charolett Bumpers, MD;  Location: [facility];  Service: Endoscopy;  Laterality: N/A;   ESOPHAGOGASTRODUODENOSCOPY (EGD) WITH PROPOFOL N/A 06/10/2012   Procedure: ESOPHAGOGASTRODUODENOSCOPY (EGD) WITH PROPOFOL;  Surgeon: Charolett Bumpers, MD;  Location: [facility];  Service: Endoscopy;  Laterality: N/A;   ESOPHAGOGASTRODUODENOSCOPY (EGD) WITH PROPOFOL N/A 09/22/2013   Procedure: ESOPHAGOGASTRODUODENOSCOPY (EGD) WITH PROPOFOL;  Surgeon: Charolett Bumpers, MD;  Location: [facility];  Service: Endoscopy;  Laterality: N/A;   FOOT NEUROMA SURGERY Right    interstem  12/2018   MENISCUS REPAIR Bilateral  for meniscus tear, x2 left knee   NASAL SINUS SURGERY  1993   OVARIAN CYST REMOVAL     Laparotomy   PARTIAL COLECTOMY  04/12/2010   POLYPECTOMY     port a cath insertion  2012   for chemo use   PORT-A-CATH REMOVAL  05/03/2011   RETINAL TEAR REPAIR CRYOTHERAPY Right    TOOTH EXTRACTION     molars   TOTAL ABDOMINAL HYSTERECTOMY W/ BILATERAL SALPINGOOPHORECTOMY  2013   UPPER GASTROINTESTINAL ENDOSCOPY     VARICOSE VEIN SURGERY Bilateral     Prior to Admission medications   Medication Sig Start Date End Date Taking? Authorizing Provider  Ascorbic Acid (VITAMIN C) 1000 MG tablet Take 1,000 mg by mouth 2 (two) times daily.   Yes [provider]  ASTAXANTHIN PO Take 6 mg by mouth daily.   Yes [provider]  calcium carbonate (OS-CAL) 1250 (500 Ca) MG chewable tablet Chew 1 tablet by mouth daily as needed.    Yes [provider]  Cholecalciferol (VITAMIN D) 2000 UNITS tablet Take 2,000 Units by mouth 2 (two) times a day.    Yes [provider]  clobetasol (TEMOVATE) 0.05 % external solution Apply 1 Application topically 2 (two) times daily. As needed per pt 04/07/23  Yes [provider]  Coenzyme Q10 (CO Q 10) 100 MG CAPS Take 100 mg by mouth daily.   Yes [provider]  doxycycline (VIBRAMYCIN) 100 MG capsule Take 100 mg by mouth daily. 04/25/21  Yes [provider]  DULoxetine (CYMBALTA) 60 MG capsule Take 1 capsule by mouth every morning. 07/12/14  Yes [provider]  Glucosamine-Chondroit-Vit C-Mn (GLUCOSAMINE CHONDR 1500 COMPLX PO) Take 1 capsule by mouth 2 (two) times daily.    Yes [provider]  hydroxychloroquine (PLAQUENIL) 200 MG tablet Take 200 mg by mouth daily. 03/12/22  Yes [provider]  levothyroxine (SYNTHROID) 100 MCG tablet Take 100 mcg by mouth every other day. With 88 mcg on opposing days 06/18/15  Yes [provider]  levothyroxine (SYNTHROID) 88 MCG tablet Take 88 mcg by mouth every morning. 09/07/19  Yes [provider]  Magnesium 200 MG TABS Take 400 mg by mouth daily.    Yes [provider]  minoxidil (LONITEN) 2.5 MG tablet Take 2.5 mg by mouth daily.   Yes [provider]  modafinil (PROVIGIL) 200 MG tablet Take 200 mg by mouth daily. 2 tabs daily, morning and afternoon   Yes [provider]  Multiple Vitamin (MULTIVITAMIN WITH MINERALS) TABS Take 1 tablet by mouth daily.   Yes [provider]  Polyethyl Glycol-Propyl Glycol 0.4-0.3 % SOLN Apply 1 drop to eye 3 (three) times daily as needed (dry eyes).   Yes [provider]  Turmeric 500 MG CAPS 1000mg    Yes [provider]  Cyanocobalamin 2500 MCG SUBL Place 1 tablet under the tongue daily at 6 (six) AM.    [provider]  ibuprofen (ADVIL,MOTRIN) 200 MG tablet Take 200 mg by mouth every 6 (six) hours as needed for moderate pain.    [provider]  ketoconazole (NIZORAL) 2 % shampoo APPLY TO THE SCALP AND LET IT SET FOR 3-5 MINUTES AND RINSE OUT ONCE DAILY 07/24/21   Sheffield, Harvin Hazel R, PA-C  ondansetron (ZOFRAN) 4 MG tablet Take 1 tablet (4 mg total) by mouth as directed. 03/25/23   Jaskiran Pata, Carie Caddy, MD    Current Outpatient Medications  Medication Sig Dispense Refill  Ascorbic Acid (VITAMIN C) 1000 MG tablet Take 1,000 mg by mouth 2 (two) times daily.     ASTAXANTHIN PO Take 6 mg by mouth daily.     calcium carbonate (OS-CAL) 1250 (500 Ca) MG chewable tablet Chew 1 tablet by mouth daily as needed.      Cholecalciferol (VITAMIN D) 2000 UNITS tablet Take 2,000 Units by mouth 2 (two) times a day.      clobetasol (TEMOVATE) 0.05 % external solution Apply 1 Application topically 2 (two) times daily. As needed per pt     Coenzyme Q10 (CO Q 10) 100 MG CAPS Take 100 mg by mouth daily.     doxycycline (VIBRAMYCIN) 100 MG capsule Take 100 mg by mouth daily.     DULoxetine (CYMBALTA) 60 MG capsule Take 1 capsule by mouth every morning.     Glucosamine-Chondroit-Vit C-Mn (GLUCOSAMINE CHONDR 1500 COMPLX PO) Take 1 capsule by mouth 2 (two) times daily.      hydroxychloroquine (PLAQUENIL) 200 MG tablet Take 200 mg by mouth daily.     levothyroxine (SYNTHROID) 100 MCG tablet Take 100 mcg by mouth every other day. With 88 mcg on opposing days     levothyroxine (SYNTHROID) 88 MCG tablet Take 88 mcg by mouth every morning.     Magnesium 200 MG TABS Take 400 mg by mouth daily.      minoxidil (LONITEN) 2.5 MG tablet Take 2.5 mg by mouth daily.     modafinil (PROVIGIL) 200 MG tablet Take 200 mg by mouth daily. 2 tabs daily, morning and afternoon     Multiple Vitamin (MULTIVITAMIN WITH MINERALS) TABS  Take 1 tablet by mouth daily.     Polyethyl Glycol-Propyl Glycol 0.4-0.3 % SOLN Apply 1 drop to eye 3 (three) times daily as needed (dry eyes).     Turmeric 500 MG CAPS 1000mg      Cyanocobalamin 2500 MCG SUBL Place 1 tablet under the tongue daily at 6 (six) AM.     ibuprofen (ADVIL,MOTRIN) 200 MG tablet Take 200 mg by mouth every 6 (six) hours as needed for moderate pain.     ketoconazole (NIZORAL) 2 % shampoo APPLY TO THE SCALP AND LET IT SET FOR 3-5 MINUTES AND RINSE OUT ONCE DAILY 120 mL 2   ondansetron (ZOFRAN) 4 MG tablet Take 1 tablet (4 mg total) by mouth as directed. 2 tablet 0   Current Facility-Administered Medications  Medication Dose Route Frequency Provider Last Rate Last Admin   0.9 %  sodium chloride infusion  500 mL Intravenous Once Braylyn Kalter, Carie Caddy, MD        Allergies as of 04/08/2023 - Review Complete 04/08/2023  Allergen Reaction Noted   Atorvastatin  04/18/2020   Estrogens  11/27/2021   Other  04/18/2020   Pitavastatin  04/18/2020   Simvastatin  04/18/2020   Lactose  04/18/2020    Family History  Problem Relation Age of Onset   Heart disease Mother    Heart attack Mother    Heart disease Father    Heart attack Father    Heart disease Brother    Heart attack Brother    Other Brother        Personnel officer syndrome-PMS2   Cancer - Other Brother    Colon polyps Brother    Other Brother        Lynch syndrome-PMS2   Sleep apnea Brother    Heart disease Maternal Grandmother    Heart attack Maternal Grandmother    Heart disease Maternal Grandfather  Heart attack Maternal Grandfather    Alzheimer's disease Paternal Grandmother    Emphysema Paternal Grandfather    Other Son        lynch syndrome-PMS2   Colon polyps Son    Esophageal cancer Neg Hx    Stomach cancer Neg Hx    Rectal cancer Neg Hx    Colon cancer Neg Hx     Social History   Socioeconomic History   Marital status: Married    Spouse name: Not on file   Number of children: 1   Years of education:  Not on file   Highest education level: Not on file  Occupational History   Occupation: retired Runner, broadcasting/film/video  Tobacco Use   Smoking status: Never   Smokeless tobacco: Never   Tobacco comments:    high school years 1973 - tried  to smoke and did not take or like it   Advertising account planner   Vaping status: Never Used  Substance and Sexual Activity   Alcohol use: No   Drug use: No   Sexual activity: Yes    Birth control/protection: Post-menopausal  Other Topics Concern   Not on file  Social History Narrative   Not on file   Social Drivers of Health   Financial Resource Strain: Not on file  Food Insecurity: Low Risk  (01/08/2023)   Received from [facility]   Hunger Vital Sign    Worried About Running Out of Food in the Last Year: Never true    Ran Out of Food in the Last Year: Never true  Transportation Needs: No Transportation Needs (01/08/2023)   Received from [facility]    In the past 12 months, has lack of reliable transportation kept you from medical appointments, meetings, work or from getting things needed for daily living? : No  Physical Activity: Not on file  Stress: Not on file  Social Connections: Not on file  Intimate Partner Violence: Not on file    Physical Exam: Vital signs in last 24 hours: @BP  134/67   Pulse 68   Temp (!) 97.3 F (36.3 C) (Temporal)   Ht 5\' 6"  (1.676 m)   Wt 147 lb (66.7 kg)   SpO2 97%   BMI 23.73 kg/m  GEN: NAD EYE: Sclerae anicteric ENT: MMM CV: Non-tachycardic Pulm: CTA b/l GI: Soft, NT/ND NEURO:  Alert & Oriented x 3   Erick Blinks, MD [facility]  04/08/2023 1:29 PM

## 2023-04-08 NOTE — Patient Instructions (Signed)
Discharge instructions given. Handouts on polyps and Diverticulosis. Biopsies taken. Resume previous medications. YOU HAD AN ENDOSCOPIC PROCEDURE TODAY AT THE [facility]:   Refer to the procedure report that was given to you for any specific questions about what was found during the examination.  If the procedure report does not answer your questions, please call your gastroenterologist to clarify.  If you requested that your care partner not be given the details of your procedure findings, then the procedure report has been included in a sealed envelope for you to review at your convenience later.  YOU SHOULD EXPECT: Some feelings of bloating in the abdomen. Passage of more gas than usual.  Walking can help get rid of the air that was put into your GI tract during the procedure and reduce the bloating. If you had a lower endoscopy (such as a colonoscopy or flexible sigmoidoscopy) you may notice spotting of blood in your stool or on the toilet paper. If you underwent a bowel prep for your procedure, you may not have a normal bowel movement for a few days.  Please Note:  You might notice some irritation and congestion in your nose or some drainage.  This is from the oxygen used during your procedure.  There is no need for concern and it should clear up in a day or so.  SYMPTOMS TO REPORT IMMEDIATELY:  Following lower endoscopy (colonoscopy or flexible sigmoidoscopy):  Excessive amounts of blood in the stool  Significant tenderness or worsening of abdominal pains  Swelling of the abdomen that is new, acute  Fever of 100F or higher  Following upper endoscopy (EGD)  Vomiting of blood or coffee ground material  New chest pain or pain under the shoulder blades  Painful or persistently difficult swallowing  New shortness of breath  Fever of 100F or higher  Black, tarry-looking stools  For urgent or emergent issues, a gastroenterologist can be reached at any hour by calling [phone]  [phone]. Do not use MyChart messaging for urgent concerns.    DIET:  We do recommend a small meal at first, but then you may proceed to your regular diet.  Drink plenty of fluids but you should avoid alcoholic beverages for 24 hours.  ACTIVITY:  You should plan to take it easy for the rest of today and you should NOT DRIVE or use heavy machinery until tomorrow (because of the sedation medicines used during the test).    FOLLOW UP: Our staff will call the number listed on your records the next business day following your procedure.  We will call around 7:15- 8:00 am to check on you and address any questions or concerns that you may have regarding the information given to you following your procedure. If we do not reach you, we will leave a message.     If any biopsies were taken you will be contacted by phone or by letter within the next 1-3 weeks.  Please call us at [phone] if you have not heard about the biopsies in 3 weeks.    SIGNATURES/CONFIDENTIALITY: You and/or your care partner have signed paperwork which will be entered into your electronic medical record.  These signatures attest to the fact that that the information above on your After Visit Summary has been reviewed and is understood.  Full responsibility of the confidentiality of this discharge information lies with you and/or your care-partner.

## 2023-04-08 NOTE — Progress Notes (Signed)
Called to procedure room to assist with colonoscopy/endoscopy.

## 2023-04-09 ENCOUNTER — Telehealth: Payer: Self-pay

## 2023-04-09 NOTE — Telephone Encounter (Signed)
  Follow up Call-     04/08/2023   12:57 PM 12/18/2021   12:51 PM 01/04/2021   10:34 AM  Call back number  Post procedure Call Back phone  # [phone] #[phone] hm  Permission to leave phone message Yes Yes Yes     Patient questions:  Do you have a fever, pain , or abdominal swelling? No. Pain Score  0 *  Have you tolerated food without any problems? Yes.    Have you been able to return to your normal activities? Yes.    Do you have any questions about your discharge instructions: Diet   No. Medications  No. Follow up visit  No.  Do you have questions or concerns about your Care? No.  Actions: * If pain score is 4 or above: No action needed, pain <4.

## 2023-04-11 ENCOUNTER — Encounter: Payer: Self-pay | Admitting: Internal Medicine

## 2023-04-11 LAB — SURGICAL PATHOLOGY

## 2023-04-23 ENCOUNTER — Encounter: Payer: Self-pay | Admitting: Sports Medicine

## 2023-04-24 ENCOUNTER — Ambulatory Visit (INDEPENDENT_AMBULATORY_CARE_PROVIDER_SITE_OTHER): Payer: Medicare Other | Admitting: Sports Medicine

## 2023-04-24 ENCOUNTER — Encounter: Payer: Self-pay | Admitting: Sports Medicine

## 2023-04-24 VITALS — BP 130/80 | HR 82 | Temp 97.7°F | Resp 20 | Ht 66.0 in | Wt 148.6 lb

## 2023-04-24 DIAGNOSIS — G4710 Hypersomnia, unspecified: Secondary | ICD-10-CM

## 2023-04-24 DIAGNOSIS — R413 Other amnesia: Secondary | ICD-10-CM | POA: Diagnosis not present

## 2023-04-24 DIAGNOSIS — F321 Major depressive disorder, single episode, moderate: Secondary | ICD-10-CM | POA: Diagnosis not present

## 2023-04-24 DIAGNOSIS — R42 Dizziness and giddiness: Secondary | ICD-10-CM

## 2023-04-24 NOTE — Progress Notes (Signed)
Careteam: Patient Care Team: Venita Sheffield, MD as PCP - General (Internal Medicine) Roger Kill, MD as PCP - Hematology/Oncology (Hematology and Oncology) Yates Decamp, MD as PCP - Cardiology (Cardiology) Janalyn Harder, MD (Inactive) as Consulting Physician (Dermatology) Glyn Ade, PA-C as Physician Assistant (Dermatology)  PLACE OF SERVICE:  [facility]  Advanced Directive information Does Patient Have a Medical Advance Directive?: Yes, Type of Advance Directive: Healthcare Power of [place];Living will, Does patient want to make changes to medical advance directive?: No - Patient declined  Allergies  Allergen Reactions   Atorvastatin     Other reaction(s): myalgias   Estrogens     Vaginal estrogen- "flu like symptoms", headaches   Other     Other reaction(s): rash Plastics on face- rash   Pitavastatin     Other reaction(s): myalgias   Simvastatin     Other reaction(s): myalgias   Lactose     Other reaction(s): bloated Other reaction(s): bloated    Chief Complaint  Patient presents with   Establish Care    New patient appointment.     Discussed the use of AI scribe software for clinical note transcription with the patient, who gave verbal consent to proceed.  History of Present Illness   RISHITA PETRON is a 73 year old female with idiopathic hypersomnia and history of brain trauma who presents with memory issues and chronic dizziness. She was referred by several neurologists for further evaluation.  She has a history of idiopathic hypersomnia, initially diagnosed at a sleep clinic, which is believed to be related to multiple brain traumas sustained over the years, including several concussions. These symptoms are overwhelming and difficult to explain to others. She is currently managing the condition with modafinil prescribed by her sleep specialist.  She has been experiencing memory issues for the past one to two years, including word-finding  difficulties, misplacing items, and occasional forgetfulness in daily tasks. An example includes almost placing creamer in the coffee machine's water reservoir instead of her coffee cup. Despite these issues, she manages her medications and finances, although her husband primarily handles the latter. No recent memory problems or getting lost while driving.  She describes a persistent feeling of 'brain fog' and a heavy sensation in her head, which she finds overwhelming. Chronic dizziness has progressed from occasional to constant, improving when she is still and focused. The dizziness does not seem to be related to her current medications, as stopping Wellbutrin did not alleviate the symptom.  She underwent two MRIs and a CT scan at [facility], which revealed significant brain atrophy. She follows with neurology.  She is on Cymbalta 60 mg for depression, which she feels is well-managed. She was previously on Wellbutrin, which was discontinued due to concerns about dizziness. She feels tired all the time, attributed to her hypersomnia, and experiences occasional feelings of being down, mainly due to frustration with her condition. No thoughts of self-harm.  Her family history includes a grandmother with dementia and several family members who died young from heart problems.         Review of Systems:  Review of Systems  Constitutional:  Negative for chills and fever.  HENT:  Negative for congestion and sore throat.   Eyes:  Negative for double vision.  Respiratory:  Negative for cough, sputum production and shortness of breath.   Cardiovascular:  Negative for chest pain, palpitations and leg swelling.  Gastrointestinal:  Negative for abdominal pain, heartburn and nausea.  Genitourinary:  Negative for dysuria,  frequency and hematuria.  Musculoskeletal:  Negative for falls and myalgias.  Neurological:  Positive for dizziness. Negative for sensory change and focal weakness.  Psychiatric/Behavioral:   Positive for depression and memory loss. Negative for suicidal ideas.    Negative unless indicated in HPI.   Past Medical History:  Diagnosis Date   Abdominal pain    Allergy    Anemia    Anxiety    Arthritis    hands/hip, gout left ankle   Atypical nevus 10/04/2003   slight-mod-left post axilla   Blood transfusion 03/2010   at [facility]   Cataract    Colon cancer (HCC) 03/2010   Concussion    per pt, has had 2-3 concussions due to horse injuries   Depression    Fatigue    GERD (gastroesophageal reflux disease)    occasional   Heart murmur    hx -no problems   Hyperlipidemia    Interstitial cystitis    Lactose intolerance    Lynch syndrome 12/22/2011   Major depression    clinical- sees leslie brown   MVP (mitral valve prolapse)    mild-seen on echo 2008   Neuromuscular disorder (HCC)    raynaud's disease hands/feet   Neuropathy    Obstructive sleep apnea    very mild   Osteoarthritis    Osteoporosis    Pelvic fracture (HCC)    while riding a horse in 2008   Peripheral neuropathy    mild- due to chemotherapy   Prinzmetal angina (HCC)    Raynaud's syndrome    hands/feet   Serrated adenoma of colon    Squamous cell carcinoma of skin 10/27/2014   KA- right inner shin,ant  (txpbx), in situ- left top shin (txpbx)   SVD (spontaneous vaginal delivery)    x 1   Thyroid disease    Past Surgical History:  Procedure Laterality Date   ABDOMINO-VAGINAL VESICAL NECK SUSPENSION  10/2018   BUNIONECTOMY     Bil/ hammer toe on right foot   COLONOSCOPY     COLONOSCOPY WITH PROPOFOL N/A 06/10/2012   Procedure: COLONOSCOPY WITH PROPOFOL;  Surgeon: Charolett Bumpers, MD;  Location: [facility];  Service: Endoscopy;  Laterality: N/A;   COLONOSCOPY WITH PROPOFOL N/A 09/22/2013   Procedure: COLONOSCOPY WITH PROPOFOL;  Surgeon: Charolett Bumpers, MD;  Location: [facility];  Service: Endoscopy;  Laterality: N/A;   COLONOSCOPY WITH PROPOFOL N/A 08/02/2015   Procedure: COLONOSCOPY WITH  PROPOFOL;  Surgeon: Charolett Bumpers, MD;  Location: [facility];  Service: Endoscopy;  Laterality: N/A;   ESOPHAGOGASTRODUODENOSCOPY (EGD) WITH PROPOFOL N/A 06/10/2012   Procedure: ESOPHAGOGASTRODUODENOSCOPY (EGD) WITH PROPOFOL;  Surgeon: Charolett Bumpers, MD;  Location: [facility];  Service: Endoscopy;  Laterality: N/A;   ESOPHAGOGASTRODUODENOSCOPY (EGD) WITH PROPOFOL N/A 09/22/2013   Procedure: ESOPHAGOGASTRODUODENOSCOPY (EGD) WITH PROPOFOL;  Surgeon: Charolett Bumpers, MD;  Location: [facility];  Service: Endoscopy;  Laterality: N/A;   FOOT NEUROMA SURGERY Right    interstem  12/2018   MENISCUS REPAIR Bilateral    for meniscus tear, x2 left knee   NASAL SINUS SURGERY  1993   OVARIAN CYST REMOVAL     Laparotomy   PARTIAL COLECTOMY  04/12/2010   POLYPECTOMY     port a cath insertion  2012   for chemo use   PORT-A-CATH REMOVAL  05/03/2011   RETINAL TEAR REPAIR CRYOTHERAPY Right    TOOTH EXTRACTION     molars   TOTAL ABDOMINAL HYSTERECTOMY W/ BILATERAL SALPINGOOPHORECTOMY  2013  UPPER GASTROINTESTINAL ENDOSCOPY     VARICOSE VEIN SURGERY Bilateral    Social History:   reports that she has never smoked. She has never used smokeless tobacco. She reports that she does not drink alcohol and does not use drugs.  Family History  Problem Relation Age of Onset   Heart disease Mother    Heart attack Mother    Heart disease Father    Heart attack Father    Heart disease Brother    Heart attack Brother    Other Brother        Personnel officer syndrome-PMS2   Cancer - Other Brother    Colon polyps Brother    Other Brother        Lynch syndrome-PMS2   Sleep apnea Brother    Heart disease Maternal Grandmother    Heart attack Maternal Grandmother    Heart disease Maternal Grandfather    Heart attack Maternal Grandfather    Alzheimer's disease Paternal Grandmother    Emphysema Paternal Grandfather    Other Son        lynch syndrome-PMS2   Colon polyps Son    Esophageal cancer Neg Hx     Stomach cancer Neg Hx    Rectal cancer Neg Hx    Colon cancer Neg Hx     Medications: Patient's Medications  New Prescriptions   No medications on file  Previous Medications   ASCORBIC ACID (VITAMIN C) 1000 MG TABLET    Take 1,000 mg by mouth 2 (two) times daily.   ASTAXANTHIN PO    Take 6 mg by mouth daily.   CALCIUM CARBONATE (OS-CAL) 1250 (500 CA) MG CHEWABLE TABLET    Chew 1 tablet by mouth daily as needed.    CHOLECALCIFEROL (VITAMIN D) 2000 UNITS TABLET    Take 2,000 Units by mouth 2 (two) times a day.    CLOBETASOL (TEMOVATE) 0.05 % EXTERNAL SOLUTION    Apply 1 Application topically 2 (two) times daily. As needed per pt   COENZYME Q10 (CO Q 10) 100 MG CAPS    Take 100 mg by mouth daily.   CYANOCOBALAMIN 2500 MCG SUBL    Place 1 tablet under the tongue daily at 6 (six) AM.   DOXYCYCLINE (VIBRAMYCIN) 100 MG CAPSULE    Take 100 mg by mouth daily.   DULOXETINE (CYMBALTA) 60 MG CAPSULE    Take 1 capsule by mouth every morning.   GLUCOSAMINE-CHONDROIT-VIT C-MN (GLUCOSAMINE CHONDR 1500 COMPLX PO)    Take 1 capsule by mouth 2 (two) times daily.    HYDROXYCHLOROQUINE (PLAQUENIL) 200 MG TABLET    Take 200 mg by mouth daily.   IBUPROFEN (ADVIL,MOTRIN) 200 MG TABLET    Take 200 mg by mouth every 6 (six) hours as needed for moderate pain.   KETOCONAZOLE (NIZORAL) 2 % SHAMPOO    APPLY TO THE SCALP AND LET IT SET FOR 3-5 MINUTES AND RINSE OUT ONCE DAILY   LEVOTHYROXINE (SYNTHROID) 100 MCG TABLET    Take 100 mcg by mouth every other day. With 88 mcg on opposing days   LEVOTHYROXINE (SYNTHROID) 88 MCG TABLET    Take 88 mcg by mouth every morning.   MAGNESIUM 200 MG TABS    Take 400 mg by mouth daily.    MINOXIDIL (LONITEN) 2.5 MG TABLET    Take 2.5 mg by mouth daily.   MODAFINIL (PROVIGIL) 200 MG TABLET    Take 200 mg by mouth daily. 2 tabs daily, morning and afternoon   MULTIPLE  VITAMIN (MULTIVITAMIN WITH MINERALS) TABS    Take 1 tablet by mouth daily.   ONDANSETRON (ZOFRAN) 4 MG TABLET    Take 1  tablet (4 mg total) by mouth as directed.   POLYETHYL GLYCOL-PROPYL GLYCOL 0.4-0.3 % SOLN    Apply 1 drop to eye 3 (three) times daily as needed (dry eyes).   TURMERIC 500 MG CAPS    1000mg   Modified Medications   No medications on file  Discontinued Medications   No medications on file    Physical Exam: Vitals:   04/24/23 0925  BP: 130/80  Pulse: 82  Resp: 20  Temp: 97.7 F (36.5 C)  SpO2: 97%  Weight: 148 lb 9.6 oz (67.4 kg)  Height: 5\' 6"  (1.676 m)   Body mass index is 23.98 kg/m. BP Readings from Last 3 Encounters:  04/24/23 130/80  04/08/23 125/83  01/28/23 120/68   Wt Readings from Last 3 Encounters:  04/24/23 148 lb 9.6 oz (67.4 kg)  04/08/23 147 lb (66.7 kg)  03/25/23 147 lb (66.7 kg)    Physical Exam Constitutional:      Appearance: Normal appearance.  HENT:     Head: Normocephalic and atraumatic.  Cardiovascular:     Rate and Rhythm: Normal rate and regular rhythm.     Pulses: Normal pulses.     Heart sounds: Normal heart sounds.  Pulmonary:     Effort: No respiratory distress.     Breath sounds: No stridor. No wheezing or rales.  Abdominal:     General: Bowel sounds are normal. There is no distension.     Palpations: Abdomen is soft.     Tenderness: There is no abdominal tenderness. There is no right CVA tenderness or guarding.  Musculoskeletal:        General: No swelling.  Neurological:     Mental Status: She is alert. Mental status is at baseline.     Sensory: No sensory deficit.     Motor: No weakness.     Labs reviewed: Basic Metabolic Panel: No results for input(s): "NA", "K", "CL", "CO2", "GLUCOSE", "BUN", "CREATININE", "CALCIUM", "MG", "PHOS", "TSH" in the last 8760 hours. Liver Function Tests: No results for input(s): "AST", "ALT", "ALKPHOS", "BILITOT", "PROT", "ALBUMIN" in the last 8760 hours. No results for input(s): "LIPASE", "AMYLASE" in the last 8760 hours. No results for input(s): "AMMONIA" in the last 8760 hours. CBC: No  results for input(s): "WBC", "NEUTROABS", "HGB", "HCT", "MCV", "PLT" in the last 8760 hours. Lipid Panel: No results for input(s): "CHOL", "HDL", "LDLCALC", "TRIG", "CHOLHDL", "LDLDIRECT" in the last 8760 hours. TSH: No results for input(s): "TSH" in the last 8760 hours. A1C: No results found for: "HGBA1C"  Assessment and Plan    Memory deficits  Memory issues noted over the past 1-2 years, with difficulty finding words and misplacing items. No impact on daily activities such as managing finances or taking medications. Recent neuropsychological testing performed. History of multiple head injuries. MRI shows hippocampal atrophy. pt reports doing neuropsychological testing last week results not available for review informed patient that her previous head injuries, hippocampal atrophy , depression could be contributing to her memory loss. -Encourage cognitive activities and physical exercise to maintain cognitive function. -Consider increasing dose of Cymbalta after discussion with psychiatrist.   Idiopathic Hypersomnia History of multiple brain traumas. Currently managed with Modafinil. -Continue current management with sleep specialist.  Depression Reports feeling down a couple of times a week, mainly due to frustration. Currently on Cymbalta 60mg . -Discuss with psychiatrist the  possibility of increasing Cymbalta dose.  Chronic Dizziness Constant dizziness, initially occasional but now persistent. No improvement after discontinuation of Wellbutrin. -Continue follow-up with neurologist.          60 min  Total time spent for obtaining history,  performing a medically appropriate examination and evaluation, reviewing the tests, ,  documenting clinical information in the electronic or other health record,  ,care coordination (not separately reported)

## 2024-02-05 ENCOUNTER — Encounter: Payer: Self-pay | Admitting: Cardiology

## 2024-02-05 ENCOUNTER — Ambulatory Visit: Attending: Cardiology | Admitting: Cardiology

## 2024-02-05 VITALS — BP 126/62 | HR 72 | Ht 66.0 in | Wt 152.0 lb

## 2024-02-05 DIAGNOSIS — E7800 Pure hypercholesterolemia, unspecified: Secondary | ICD-10-CM | POA: Diagnosis present

## 2024-02-05 DIAGNOSIS — I201 Angina pectoris with documented spasm: Secondary | ICD-10-CM | POA: Diagnosis present

## 2024-02-05 DIAGNOSIS — R42 Dizziness and giddiness: Secondary | ICD-10-CM | POA: Diagnosis present

## 2024-02-05 NOTE — Progress Notes (Signed)
 Cardiology Office Note:  .   Date:  02/05/2024  ID:  Angela Hart, DOB 08-12-1950, MRN [id number] PCP: Sherlynn Madden, MD  [facility] Providers Cardiologist:  Gordy Bergamo, MD   History of Present Illness: .   Angela Hart is a 73 y.o.  female with Lynch syndrome with partial colectomy for colon cancer also underwent prophylactic hysterectomy- for cancer (Lynch Syndrome: Colon cancer and Uterine cancer and  less likely other organs being followed at [facility]), printzemal angina diagnosed in 2014, hyperlipidemia, and family history of early CAD.  Fortunately her coronary calcium  score in 2022 was 0. She is statin intolerant and could not also tolerate Nexletol .   She has chronic hyponatremia.  Fortunately she has not had any further chest pain and remains asymptomatic from cardiac standpoint but has multiple somatic complaints.  Dizziness continues to be a chronic problem and has been extensively evaluated.    Discussed the use of AI scribe software for clinical note transcription with the patient, who gave verbal consent to proceed.  History of Present Illness Angela Hart is a 73 year old female who presents with persistent dizziness and idiopathic hypersomnia.  She reports chronic dizziness that has worsened and is worse with fatigue. She describes it as an internal sensation rather than vertigo and says it significantly limits her function by the end of the day. Prior neurologic and cognitive evaluation, including senior memory testing, was normal.  She was diagnosed with idiopathic hypersomnia in 2020, which she feels is distinct from the dizziness. She has not been told she has sleep apnea.  She monitors her blood pressure at home through the [place] program. Readings usually range from 121 to 138 mmHg, with intermittent elevations to 150-160 mmHg. She feels pressure from the program to maintain tighter control, though she believes her blood pressure is  generally stable.  She reports many family members died from cardiovascular disease and is concerned about her own cardiovascular risk despite not having had similar events herself.  Cardiac Studies relevent.    Echocardiogram 05/30/2018:  Left ventricle cavity is normal in size. Normal global wall motion. Doppler evidence of grade I (impaired) diastolic dysfunction, normal LAP.  Calculated EF 55%.  Mild (Grade I) mitral regurgitation.  No evidence of pulmonary hypertension.    Exercise Sestamibi Stress Test 07/14/2018 1. The resting electrocardiogram demonstrated normal sinus rhythm, normal resting conduction and normal rest repolarization.  The stress electrocardiogram was positive for ischemia with 2 mm horizontal to up sloping ST depression at peak exercise persisting for >3 min into recovery. No arrhythmias. Normal BP response. Patient exercised on Bruce protocol for 8:29 minutes and achieved 10.16 METS. Stress test terminated due to dyspnea and 88% MPHR achieved (Target HR >85%).  2. Myocardial perfusion imaging is normal. Overall left ventricular systolic function was normal without regional wall motion abnormalities. The left ventricular ejection fraction was 73%.   3.  This is a low risk study, clinical correlation recommended   09/29/2020 CACS FINDINGS: Total Agatston Score: 0. MESA database percentile: 0 AORTA MEASUREMENTS: Ascending Aorta: 36 mm Descending Aorta: 23 mm  Labs   Care everywhere/Faxed External Labs:  Lipid profile 10/07/2023: TC 226, TG 198, HDL 91, LDL 102  Labs 12/24/2022:  CBC normal.  Sodium 132, potassium 4.4, BUN 16, creatinine 0.62, eGFR >90 mL.  Labs 12/06/2022:  Total cholesterol 243, triglycerides 118, HDL 97, LDL 122. NHDL 146  ROS  Review of Systems  Cardiovascular:  Negative for chest pain,  dyspnea on exertion and leg swelling.   Physical Exam:   VS:  BP 126/62 (BP Location: Right Arm, Patient Position: Sitting, Cuff Size: Normal)    Pulse 72   Ht 5' 6 (1.676 m)   Wt 152 lb (68.9 kg)   BMI 24.53 kg/m    Wt Readings from Last 3 Encounters:  02/05/24 152 lb (68.9 kg)  04/24/23 148 lb 9.6 oz (67.4 kg)  04/08/23 147 lb (66.7 kg)    BP Readings from Last 3 Encounters:  02/05/24 126/62  04/24/23 130/80  04/08/23 125/83   Physical Exam Neck:     Vascular: No carotid bruit or JVD.  Cardiovascular:     Rate and Rhythm: Normal rate and regular rhythm.     Pulses: Intact distal pulses.     Heart sounds: Normal heart sounds. No murmur heard.    No gallop.  Pulmonary:     Effort: Pulmonary effort is normal.     Breath sounds: Normal breath sounds.  Abdominal:     General: Bowel sounds are normal.     Palpations: Abdomen is soft.  Musculoskeletal:     Right lower leg: No edema.     Left lower leg: No edema.    EKG:    EKG Interpretation Date/Time:  Wednesday February 05 2024 08:59:29 EST Ventricular Rate:  72 PR Interval:  156 QRS Duration:  82 QT Interval:  370 QTC Calculation: 405 R Axis:   9  Text Interpretation: EKG 02/05/2024: Normal sinus rhythm with rate of 72 bpm, normal EKG.  No change from 01/28/2023. Confirmed by Per Beagley, Jagadeesh ([id number]) on 02/05/2024 9:16:32 AM    ASSESSMENT AND PLAN: .      ICD-10-CM   1. Prinzmetal angina  I20.1 EKG 12-Lead    2. Mild hypercholesterolemia  E78.00 EKG 12-Lead    3. Dizziness  R42      Assessment & Plan Chronic dizziness Persistent and worsening dizziness, not associated with vertigo, present during various activities and at rest. Previous evaluations have not identified a clear etiology. Differential diagnosis includes idiopathic hypersomnia, but dizziness is distinct from sleep-related issues. She is interested in exploring neurofeedback therapy, which has been beneficial for her son. - Recommended exploring alternative medicine options, including neurofeedback therapy.  Elevated blood pressure without diagnosis of hypertension Blood pressure  readings fluctuate between 121/80 mmHg and 141/90 mmHg. Current management includes monitoring through a program, but she expresses concern about the variability and stress associated with daily monitoring. Blood pressure readings are not concerning to the provider. - Discontinued daily blood pressure monitoring program. - Causing more anxiety with daily blood pressure monitoring. - Presently not on any antihypertensive medications.  Pure hypercholesterolemia Mild hypercholesterolemia with LDL at 102 mg/dL and HDL at 91 mg/dL. Coronary calcium  score is zero, indicating low cardiovascular risk. No need for pharmacological intervention due to mild nature and good tolerance to lifestyle modifications. - Continue current lifestyle modifications for cholesterol management.   Follow up: PRN Signed,  Gordy Bergamo, MD, [facility] 02/05/2024, 9:27 AM [facility] [address] [place], [place] [address] Phone: [phone]. Fax:  [phone]

## 2024-02-05 NOTE — Patient Instructions (Signed)
